# Patient Record
Sex: Female | Born: 1935 | Race: White | Hispanic: No | Smoking: Never smoker
Health system: Southern US, Community
[De-identification: ages and names within clinical notes are randomized; demographics above are authoritative.]

## PROBLEM LIST (undated history)

## (undated) ENCOUNTER — Emergency Department (HOSPITAL_COMMUNITY): Payer: Self-pay | Source: Home / Self Care

## (undated) DIAGNOSIS — M792 Neuralgia and neuritis, unspecified: Secondary | ICD-10-CM

## (undated) DIAGNOSIS — I447 Left bundle-branch block, unspecified: Secondary | ICD-10-CM

## (undated) DIAGNOSIS — M1990 Unspecified osteoarthritis, unspecified site: Secondary | ICD-10-CM

## (undated) DIAGNOSIS — R32 Unspecified urinary incontinence: Secondary | ICD-10-CM

## (undated) DIAGNOSIS — J189 Pneumonia, unspecified organism: Secondary | ICD-10-CM

## (undated) DIAGNOSIS — K219 Gastro-esophageal reflux disease without esophagitis: Secondary | ICD-10-CM

## (undated) DIAGNOSIS — G2581 Restless legs syndrome: Secondary | ICD-10-CM

## (undated) HISTORY — PX: INCONTINENCE SURGERY: SHX676

## (undated) HISTORY — PX: ABDOMINAL HYSTERECTOMY: SHX81

## (undated) HISTORY — PX: EYE SURGERY: SHX253

## (undated) HISTORY — PX: EXCISION MORTON'S NEUROMA: SHX5013

## (undated) HISTORY — PX: TONSILLECTOMY: SUR1361

---

## 1999-12-05 ENCOUNTER — Other Ambulatory Visit: Admission: RE | Admit: 1999-12-05 | Discharge: 1999-12-05 | Payer: Self-pay | Admitting: Family Medicine

## 1999-12-28 ENCOUNTER — Encounter: Payer: Self-pay | Admitting: Family Medicine

## 1999-12-28 ENCOUNTER — Encounter: Admission: RE | Admit: 1999-12-28 | Discharge: 1999-12-28 | Payer: Self-pay | Admitting: Family Medicine

## 2000-07-31 ENCOUNTER — Emergency Department (HOSPITAL_COMMUNITY): Admission: EM | Admit: 2000-07-31 | Discharge: 2000-07-31 | Payer: Self-pay | Admitting: Emergency Medicine

## 2000-07-31 ENCOUNTER — Encounter: Payer: Self-pay | Admitting: *Deleted

## 2000-09-12 ENCOUNTER — Encounter: Admission: RE | Admit: 2000-09-12 | Discharge: 2000-09-12 | Payer: Self-pay | Admitting: Internal Medicine

## 2002-08-13 ENCOUNTER — Encounter (INDEPENDENT_AMBULATORY_CARE_PROVIDER_SITE_OTHER): Payer: Self-pay | Admitting: *Deleted

## 2002-08-13 ENCOUNTER — Ambulatory Visit (HOSPITAL_COMMUNITY): Admission: RE | Admit: 2002-08-13 | Discharge: 2002-08-13 | Payer: Self-pay | Admitting: *Deleted

## 2003-03-09 ENCOUNTER — Other Ambulatory Visit: Admission: RE | Admit: 2003-03-09 | Discharge: 2003-03-09 | Payer: Self-pay | Admitting: Obstetrics and Gynecology

## 2003-11-17 ENCOUNTER — Encounter: Payer: Self-pay | Admitting: Internal Medicine

## 2004-04-19 ENCOUNTER — Encounter: Admission: RE | Admit: 2004-04-19 | Discharge: 2004-04-19 | Payer: Self-pay | Admitting: General Surgery

## 2004-05-13 ENCOUNTER — Inpatient Hospital Stay (HOSPITAL_COMMUNITY): Admission: RE | Admit: 2004-05-13 | Discharge: 2004-05-15 | Payer: Self-pay | Admitting: Surgery

## 2005-01-03 ENCOUNTER — Emergency Department (HOSPITAL_COMMUNITY): Admission: EM | Admit: 2005-01-03 | Discharge: 2005-01-03 | Payer: Self-pay | Admitting: Emergency Medicine

## 2005-01-22 ENCOUNTER — Encounter (INDEPENDENT_AMBULATORY_CARE_PROVIDER_SITE_OTHER): Payer: Self-pay | Admitting: *Deleted

## 2005-01-22 ENCOUNTER — Inpatient Hospital Stay (HOSPITAL_COMMUNITY): Admission: RE | Admit: 2005-01-22 | Discharge: 2005-01-25 | Payer: Self-pay | Admitting: Obstetrics and Gynecology

## 2005-03-30 ENCOUNTER — Ambulatory Visit: Payer: Self-pay | Admitting: Family Medicine

## 2005-04-09 ENCOUNTER — Ambulatory Visit: Payer: Self-pay | Admitting: Family Medicine

## 2005-05-14 ENCOUNTER — Ambulatory Visit (HOSPITAL_COMMUNITY): Admission: RE | Admit: 2005-05-14 | Discharge: 2005-05-14 | Payer: Self-pay | Admitting: *Deleted

## 2005-05-15 ENCOUNTER — Ambulatory Visit (HOSPITAL_COMMUNITY): Admission: RE | Admit: 2005-05-15 | Discharge: 2005-05-15 | Payer: Self-pay | Admitting: *Deleted

## 2005-07-25 ENCOUNTER — Ambulatory Visit: Payer: Self-pay | Admitting: Family Medicine

## 2005-08-01 ENCOUNTER — Ambulatory Visit: Payer: Self-pay | Admitting: Family Medicine

## 2005-08-08 ENCOUNTER — Ambulatory Visit: Payer: Self-pay | Admitting: Internal Medicine

## 2005-08-15 ENCOUNTER — Ambulatory Visit: Payer: Self-pay | Admitting: Family Medicine

## 2005-08-22 ENCOUNTER — Ambulatory Visit: Payer: Self-pay | Admitting: Family Medicine

## 2005-08-29 ENCOUNTER — Ambulatory Visit: Payer: Self-pay | Admitting: Family Medicine

## 2005-09-05 ENCOUNTER — Ambulatory Visit: Payer: Self-pay | Admitting: Family Medicine

## 2005-09-13 ENCOUNTER — Ambulatory Visit: Payer: Self-pay | Admitting: Family Medicine

## 2005-09-20 ENCOUNTER — Ambulatory Visit: Payer: Self-pay | Admitting: Internal Medicine

## 2005-09-26 ENCOUNTER — Ambulatory Visit: Payer: Self-pay | Admitting: Family Medicine

## 2005-10-03 ENCOUNTER — Ambulatory Visit: Payer: Self-pay | Admitting: Family Medicine

## 2005-10-10 ENCOUNTER — Ambulatory Visit: Payer: Self-pay | Admitting: Family Medicine

## 2005-11-08 ENCOUNTER — Ambulatory Visit: Payer: Self-pay | Admitting: Family Medicine

## 2005-12-05 ENCOUNTER — Ambulatory Visit: Payer: Self-pay | Admitting: Family Medicine

## 2006-01-02 ENCOUNTER — Ambulatory Visit: Payer: Self-pay | Admitting: Family Medicine

## 2006-02-04 ENCOUNTER — Ambulatory Visit: Payer: Self-pay | Admitting: Family Medicine

## 2006-03-06 ENCOUNTER — Ambulatory Visit: Payer: Self-pay | Admitting: Family Medicine

## 2006-04-03 ENCOUNTER — Ambulatory Visit: Payer: Self-pay | Admitting: Family Medicine

## 2006-04-11 ENCOUNTER — Encounter: Admission: RE | Admit: 2006-04-11 | Discharge: 2006-04-11 | Payer: Self-pay | Admitting: Obstetrics and Gynecology

## 2006-04-11 ENCOUNTER — Other Ambulatory Visit: Admission: RE | Admit: 2006-04-11 | Discharge: 2006-04-11 | Payer: Self-pay | Admitting: Obstetrics and Gynecology

## 2006-05-01 ENCOUNTER — Ambulatory Visit: Payer: Self-pay | Admitting: Family Medicine

## 2006-05-29 ENCOUNTER — Ambulatory Visit: Payer: Self-pay | Admitting: Family Medicine

## 2006-05-30 ENCOUNTER — Ambulatory Visit: Payer: Self-pay | Admitting: Family Medicine

## 2006-06-06 ENCOUNTER — Ambulatory Visit: Payer: Self-pay | Admitting: Family Medicine

## 2006-06-27 ENCOUNTER — Ambulatory Visit: Payer: Self-pay | Admitting: Family Medicine

## 2006-07-24 ENCOUNTER — Ambulatory Visit: Payer: Self-pay | Admitting: Family Medicine

## 2006-08-22 ENCOUNTER — Ambulatory Visit: Payer: Self-pay | Admitting: Family Medicine

## 2006-09-18 ENCOUNTER — Ambulatory Visit: Payer: Self-pay | Admitting: Family Medicine

## 2006-10-16 ENCOUNTER — Ambulatory Visit: Payer: Self-pay | Admitting: Family Medicine

## 2006-10-24 ENCOUNTER — Ambulatory Visit: Payer: Self-pay | Admitting: Family Medicine

## 2006-10-24 ENCOUNTER — Encounter: Admission: RE | Admit: 2006-10-24 | Discharge: 2006-10-24 | Payer: Self-pay | Admitting: Family Medicine

## 2006-10-24 LAB — CONVERTED CEMR LAB
Albumin: 3.8 g/dL (ref 3.5–5.2)
Chloride: 99 meq/L (ref 96–112)
Chol/HDL Ratio, serum: 3.7
Cholesterol: 244 mg/dL (ref 0–200)
Eosinophil percent: 1 % (ref 0.0–5.0)
Folate: >20 ng/mL
Free T4: 0.9 ng/dL (ref 0.9–1.8)
HCT: 39.9 % (ref 36.0–46.0)
HDL: 65.9 mg/dL (ref >39.0)
LDL DIRECT: 159.4 mg/dL
MCHC: 33.9 g/dL (ref 30.0–36.0)
Monocytes Absolute: 0.6 10*3/uL (ref 0.2–0.7)
Monocytes Relative: 9.5 % (ref 3.0–11.0)
Neutro Abs: 3.2 10*3/uL (ref 1.4–7.7)
Platelets: 223 10*3/uL (ref 150–400)
Potassium: 3.7 meq/L (ref 3.5–5.1)
RBC: 4.2 M/uL (ref 3.87–5.11)
RDW: 12.5 % (ref 11.5–14.6)
Sodium: 139 meq/L (ref 135–145)
T3, Free: 3.2 pg/mL (ref 2.3–4.2)
TSH: 1.48 microintl units/mL (ref 0.35–5.50)
Total Bilirubin: 0.5 mg/dL (ref 0.3–1.2)
Triglyceride fasting, serum: 51 mg/dL (ref 0–149)
VLDL: 10 mg/dL (ref 0–40)
Vitamin B-12: 691 pg/mL (ref 211–911)
WBC: 5.8 10*3/uL (ref 4.5–10.5)

## 2006-11-27 ENCOUNTER — Ambulatory Visit: Payer: Self-pay | Admitting: Family Medicine

## 2007-03-19 ENCOUNTER — Ambulatory Visit: Payer: Self-pay | Admitting: Family Medicine

## 2007-03-24 ENCOUNTER — Encounter: Admission: RE | Admit: 2007-03-24 | Discharge: 2007-03-24 | Payer: Self-pay | Admitting: Specialist

## 2007-04-17 ENCOUNTER — Ambulatory Visit: Payer: Self-pay | Admitting: Family Medicine

## 2007-04-17 ENCOUNTER — Encounter: Payer: Self-pay | Admitting: Internal Medicine

## 2007-04-17 LAB — CONVERTED CEMR LAB
Alkaline Phosphatase: 72 units/L (ref 39–117)
Basophils Absolute: 0.2 10*3/uL — ABNORMAL HIGH (ref 0.0–0.1)
Bilirubin, Direct: 0.1 mg/dL (ref 0.0–0.3)
CO2: 32 meq/L (ref 19–32)
Chloride: 108 meq/L (ref 96–112)
Creatinine, Ser: 0.6 mg/dL (ref 0.4–1.2)
Eosinophils Relative: 0.9 % (ref 0.0–5.0)
Ferritin: 65.9 ng/mL (ref 10.0–291.0)
Folate: 12.4 ng/mL
GFR calc Af Amer: 122 mL/min
GFR calc non Af Amer: 101 mL/min
HCT: 38.1 % (ref 36.0–46.0)
Hemoglobin: 12.8 g/dL (ref 12.0–15.0)
Homocysteine: 9.7 micromoles/L (ref 5.00–13.90)
Iron: 64 ug/dL (ref 42–145)
MCHC: 33.6 g/dL (ref 30.0–36.0)
MCV: 94.7 fL (ref 78.0–100.0)
Monocytes Absolute: 0.4 10*3/uL (ref 0.2–0.7)
Monocytes Relative: 6.8 % (ref 3.0–11.0)
Neutro Abs: 3 10*3/uL (ref 1.4–7.7)
RBC: 4.02 M/uL (ref 3.87–5.11)
Saturation Ratios: 20.4 % (ref 20.0–50.0)
Total Bilirubin: 0.6 mg/dL (ref 0.3–1.2)

## 2007-04-21 ENCOUNTER — Encounter: Payer: Self-pay | Admitting: Family Medicine

## 2007-06-11 ENCOUNTER — Encounter: Payer: Self-pay | Admitting: Family Medicine

## 2007-06-17 DIAGNOSIS — M810 Age-related osteoporosis without current pathological fracture: Secondary | ICD-10-CM

## 2007-06-17 DIAGNOSIS — G609 Hereditary and idiopathic neuropathy, unspecified: Secondary | ICD-10-CM

## 2007-06-17 DIAGNOSIS — G2581 Restless legs syndrome: Secondary | ICD-10-CM | POA: Insufficient documentation

## 2007-06-17 DIAGNOSIS — M1990 Unspecified osteoarthritis, unspecified site: Secondary | ICD-10-CM

## 2007-12-11 ENCOUNTER — Encounter: Payer: Self-pay | Admitting: Family Medicine

## 2007-12-29 ENCOUNTER — Ambulatory Visit: Payer: Self-pay | Admitting: Internal Medicine

## 2007-12-29 DIAGNOSIS — I2789 Other specified pulmonary heart diseases: Secondary | ICD-10-CM | POA: Insufficient documentation

## 2007-12-29 DIAGNOSIS — M4120 Other idiopathic scoliosis, site unspecified: Secondary | ICD-10-CM | POA: Insufficient documentation

## 2008-01-06 ENCOUNTER — Ambulatory Visit: Payer: Self-pay

## 2008-01-06 ENCOUNTER — Telehealth: Payer: Self-pay | Admitting: Internal Medicine

## 2008-01-14 ENCOUNTER — Ambulatory Visit: Payer: Self-pay | Admitting: Pulmonary Disease

## 2008-01-21 ENCOUNTER — Encounter: Payer: Self-pay | Admitting: Pulmonary Disease

## 2008-01-29 ENCOUNTER — Encounter: Payer: Self-pay | Admitting: Pulmonary Disease

## 2008-03-12 ENCOUNTER — Telehealth (INDEPENDENT_AMBULATORY_CARE_PROVIDER_SITE_OTHER): Payer: Self-pay | Admitting: *Deleted

## 2008-04-06 ENCOUNTER — Telehealth: Payer: Self-pay | Admitting: Pulmonary Disease

## 2008-04-21 ENCOUNTER — Encounter: Payer: Self-pay | Admitting: Pulmonary Disease

## 2008-05-05 ENCOUNTER — Telehealth (INDEPENDENT_AMBULATORY_CARE_PROVIDER_SITE_OTHER): Payer: Self-pay | Admitting: *Deleted

## 2008-05-06 ENCOUNTER — Telehealth: Payer: Self-pay | Admitting: Internal Medicine

## 2008-05-18 ENCOUNTER — Telehealth (INDEPENDENT_AMBULATORY_CARE_PROVIDER_SITE_OTHER): Payer: Self-pay | Admitting: *Deleted

## 2008-05-25 ENCOUNTER — Telehealth: Payer: Self-pay | Admitting: Internal Medicine

## 2008-06-07 ENCOUNTER — Ambulatory Visit: Payer: Self-pay | Admitting: Surgery

## 2008-08-16 ENCOUNTER — Telehealth (INDEPENDENT_AMBULATORY_CARE_PROVIDER_SITE_OTHER): Payer: Self-pay | Admitting: *Deleted

## 2008-08-16 ENCOUNTER — Ambulatory Visit: Payer: Self-pay | Admitting: Family Medicine

## 2008-10-12 ENCOUNTER — Ambulatory Visit: Payer: Self-pay | Admitting: Family Medicine

## 2008-10-12 DIAGNOSIS — R609 Edema, unspecified: Secondary | ICD-10-CM | POA: Insufficient documentation

## 2008-10-13 LAB — CONVERTED CEMR LAB
BUN: 22 mg/dL (ref 6–23)
CO2: 30 meq/L (ref 19–32)
Calcium: 9.2 mg/dL (ref 8.4–10.5)
GFR calc non Af Amer: 101 mL/min

## 2008-10-14 ENCOUNTER — Encounter (INDEPENDENT_AMBULATORY_CARE_PROVIDER_SITE_OTHER): Payer: Self-pay | Admitting: *Deleted

## 2008-10-15 ENCOUNTER — Telehealth (INDEPENDENT_AMBULATORY_CARE_PROVIDER_SITE_OTHER): Payer: Self-pay | Admitting: *Deleted

## 2008-10-29 ENCOUNTER — Ambulatory Visit: Payer: Self-pay | Admitting: Family Medicine

## 2008-11-05 ENCOUNTER — Encounter (INDEPENDENT_AMBULATORY_CARE_PROVIDER_SITE_OTHER): Payer: Self-pay | Admitting: *Deleted

## 2008-11-05 LAB — CONVERTED CEMR LAB
ALT: 16 units/L (ref 0–35)
Albumin: 4.3 g/dL (ref 3.5–5.2)
BUN: 31 mg/dL — ABNORMAL HIGH (ref 6–23)
Basophils Absolute: 0 10*3/uL (ref 0.0–0.1)
CO2: 26 meq/L (ref 19–32)
Calcium: 9.3 mg/dL (ref 8.4–10.5)
Cholesterol: 190 mg/dL (ref 0–200)
Creatinine, Ser: 0.7 mg/dL (ref 0.40–1.20)
Eosinophils Absolute: 0 10*3/uL (ref 0.0–0.7)
Folate: 16.8 ng/mL
Glucose, Bld: 85 mg/dL (ref 70–99)
HCT: 40.1 % (ref 36.0–46.0)
Hemoglobin: 13.1 g/dL (ref 12.0–15.0)
Indirect Bilirubin: 0.3 mg/dL (ref 0.0–0.9)
LDL Cholesterol: 115 mg/dL — ABNORMAL HIGH (ref 0–99)
Lymphs Abs: 1.2 10*3/uL (ref 0.7–4.0)
MCHC: 32.7 g/dL (ref 30.0–36.0)
Monocytes Absolute: 0.6 10*3/uL (ref 0.1–1.0)
Neutro Abs: 4.8 10*3/uL (ref 1.7–7.7)
RBC: 4.19 M/uL (ref 3.87–5.11)
RDW: 13.5 % (ref 11.5–15.5)
Sodium: 142 meq/L (ref 135–145)
Total Bilirubin: 0.4 mg/dL (ref 0.3–1.2)
Vit D, 1,25-Dihydroxy: 30 (ref 30–89)
Vitamin B-12: 446 pg/mL (ref 211–911)
WBC: 6.7 10*3/uL (ref 4.0–10.5)

## 2008-11-12 ENCOUNTER — Encounter: Payer: Self-pay | Admitting: Family Medicine

## 2009-01-21 ENCOUNTER — Ambulatory Visit: Payer: Self-pay | Admitting: Family Medicine

## 2009-01-21 DIAGNOSIS — L02419 Cutaneous abscess of limb, unspecified: Secondary | ICD-10-CM | POA: Insufficient documentation

## 2009-01-21 DIAGNOSIS — L03119 Cellulitis of unspecified part of limb: Secondary | ICD-10-CM

## 2009-01-21 DIAGNOSIS — M79609 Pain in unspecified limb: Secondary | ICD-10-CM | POA: Insufficient documentation

## 2009-01-24 ENCOUNTER — Ambulatory Visit: Admission: RE | Admit: 2009-01-24 | Discharge: 2009-01-24 | Payer: Self-pay | Admitting: Family Medicine

## 2009-01-24 ENCOUNTER — Encounter: Payer: Self-pay | Admitting: Family Medicine

## 2009-01-24 ENCOUNTER — Ambulatory Visit: Payer: Self-pay | Admitting: Vascular Surgery

## 2009-01-27 ENCOUNTER — Ambulatory Visit: Payer: Self-pay | Admitting: Family Medicine

## 2009-02-10 ENCOUNTER — Ambulatory Visit: Payer: Self-pay | Admitting: Family Medicine

## 2009-02-10 DIAGNOSIS — S81009A Unspecified open wound, unspecified knee, initial encounter: Secondary | ICD-10-CM | POA: Insufficient documentation

## 2009-02-10 DIAGNOSIS — S91009A Unspecified open wound, unspecified ankle, initial encounter: Secondary | ICD-10-CM

## 2009-02-10 DIAGNOSIS — S81809A Unspecified open wound, unspecified lower leg, initial encounter: Secondary | ICD-10-CM

## 2009-02-14 ENCOUNTER — Encounter: Payer: Self-pay | Admitting: Family Medicine

## 2009-02-23 ENCOUNTER — Emergency Department (HOSPITAL_COMMUNITY): Admission: EM | Admit: 2009-02-23 | Discharge: 2009-02-23 | Payer: Self-pay | Admitting: Emergency Medicine

## 2009-02-23 ENCOUNTER — Encounter (HOSPITAL_BASED_OUTPATIENT_CLINIC_OR_DEPARTMENT_OTHER): Admission: RE | Admit: 2009-02-23 | Discharge: 2009-02-25 | Payer: Self-pay | Admitting: General Surgery

## 2009-02-24 ENCOUNTER — Encounter: Payer: Self-pay | Admitting: Family Medicine

## 2009-02-28 ENCOUNTER — Encounter (HOSPITAL_BASED_OUTPATIENT_CLINIC_OR_DEPARTMENT_OTHER): Admission: RE | Admit: 2009-02-28 | Discharge: 2009-05-29 | Payer: Self-pay | Admitting: General Surgery

## 2009-03-28 ENCOUNTER — Encounter: Payer: Self-pay | Admitting: Family Medicine

## 2009-05-02 ENCOUNTER — Encounter: Payer: Self-pay | Admitting: Family Medicine

## 2009-06-06 ENCOUNTER — Encounter (HOSPITAL_BASED_OUTPATIENT_CLINIC_OR_DEPARTMENT_OTHER): Admission: RE | Admit: 2009-06-06 | Discharge: 2009-09-04 | Payer: Self-pay | Admitting: General Surgery

## 2009-09-02 ENCOUNTER — Encounter (HOSPITAL_BASED_OUTPATIENT_CLINIC_OR_DEPARTMENT_OTHER): Admission: RE | Admit: 2009-09-02 | Discharge: 2009-11-07 | Payer: Self-pay | Admitting: General Surgery

## 2009-09-22 ENCOUNTER — Encounter: Payer: Self-pay | Admitting: Family Medicine

## 2009-10-27 ENCOUNTER — Telehealth (INDEPENDENT_AMBULATORY_CARE_PROVIDER_SITE_OTHER): Payer: Self-pay | Admitting: *Deleted

## 2009-11-23 ENCOUNTER — Encounter (HOSPITAL_BASED_OUTPATIENT_CLINIC_OR_DEPARTMENT_OTHER): Admission: RE | Admit: 2009-11-23 | Discharge: 2009-12-26 | Payer: Self-pay | Admitting: General Surgery

## 2009-12-08 ENCOUNTER — Telehealth: Payer: Self-pay | Admitting: Family Medicine

## 2009-12-27 ENCOUNTER — Encounter (HOSPITAL_BASED_OUTPATIENT_CLINIC_OR_DEPARTMENT_OTHER): Admission: RE | Admit: 2009-12-27 | Discharge: 2010-02-07 | Payer: Self-pay | Admitting: General Surgery

## 2010-01-02 ENCOUNTER — Emergency Department (HOSPITAL_COMMUNITY): Admission: EM | Admit: 2010-01-02 | Discharge: 2010-01-02 | Payer: Self-pay | Admitting: Emergency Medicine

## 2010-01-12 ENCOUNTER — Telehealth: Payer: Self-pay | Admitting: Family Medicine

## 2010-01-16 ENCOUNTER — Encounter: Payer: Self-pay | Admitting: Family Medicine

## 2010-02-03 ENCOUNTER — Encounter: Payer: Self-pay | Admitting: Family Medicine

## 2010-03-29 ENCOUNTER — Telehealth (INDEPENDENT_AMBULATORY_CARE_PROVIDER_SITE_OTHER): Payer: Self-pay | Admitting: *Deleted

## 2010-04-07 ENCOUNTER — Telehealth: Payer: Self-pay | Admitting: Family Medicine

## 2010-04-25 ENCOUNTER — Encounter: Payer: Self-pay | Admitting: Family Medicine

## 2010-04-27 ENCOUNTER — Encounter: Admission: RE | Admit: 2010-04-27 | Discharge: 2010-04-27 | Payer: Self-pay | Admitting: Neurology

## 2010-04-28 ENCOUNTER — Telehealth (INDEPENDENT_AMBULATORY_CARE_PROVIDER_SITE_OTHER): Payer: Self-pay | Admitting: *Deleted

## 2010-05-02 ENCOUNTER — Ambulatory Visit: Payer: Self-pay | Admitting: Family Medicine

## 2010-05-02 DIAGNOSIS — N39498 Other specified urinary incontinence: Secondary | ICD-10-CM

## 2010-05-05 ENCOUNTER — Encounter: Payer: Self-pay | Admitting: Family Medicine

## 2010-05-08 ENCOUNTER — Encounter: Payer: Self-pay | Admitting: Family Medicine

## 2010-05-08 ENCOUNTER — Telehealth: Payer: Self-pay | Admitting: Family Medicine

## 2010-05-11 ENCOUNTER — Telehealth: Payer: Self-pay | Admitting: Family Medicine

## 2010-05-16 ENCOUNTER — Encounter: Payer: Self-pay | Admitting: Family Medicine

## 2010-05-23 ENCOUNTER — Telehealth: Payer: Self-pay | Admitting: Family Medicine

## 2010-06-01 ENCOUNTER — Encounter: Payer: Self-pay | Admitting: Family Medicine

## 2010-06-05 ENCOUNTER — Encounter: Payer: Self-pay | Admitting: Family Medicine

## 2010-06-08 ENCOUNTER — Encounter: Payer: Self-pay | Admitting: Family Medicine

## 2010-06-09 ENCOUNTER — Telehealth: Payer: Self-pay | Admitting: Family Medicine

## 2010-06-21 ENCOUNTER — Telehealth: Payer: Self-pay | Admitting: Family Medicine

## 2010-06-28 ENCOUNTER — Encounter (HOSPITAL_BASED_OUTPATIENT_CLINIC_OR_DEPARTMENT_OTHER): Admission: RE | Admit: 2010-06-28 | Discharge: 2010-07-26 | Payer: Self-pay | Admitting: Internal Medicine

## 2010-07-04 ENCOUNTER — Ambulatory Visit: Payer: Self-pay | Admitting: Family Medicine

## 2010-07-15 ENCOUNTER — Encounter: Payer: Self-pay | Admitting: Family Medicine

## 2010-07-27 ENCOUNTER — Telehealth (INDEPENDENT_AMBULATORY_CARE_PROVIDER_SITE_OTHER): Payer: Self-pay | Admitting: *Deleted

## 2010-09-05 ENCOUNTER — Telehealth: Payer: Self-pay | Admitting: Family Medicine

## 2010-09-26 ENCOUNTER — Telehealth (INDEPENDENT_AMBULATORY_CARE_PROVIDER_SITE_OTHER): Payer: Self-pay | Admitting: *Deleted

## 2011-01-08 IMAGING — CR DG CHEST 2V
2 series · 2 of 2 positions shown · non-contrast
Comparison: 01/14/2008

CLINICAL DATA: Cough and shortness of breath

CHEST - 2 VIEW

[w chest pa]
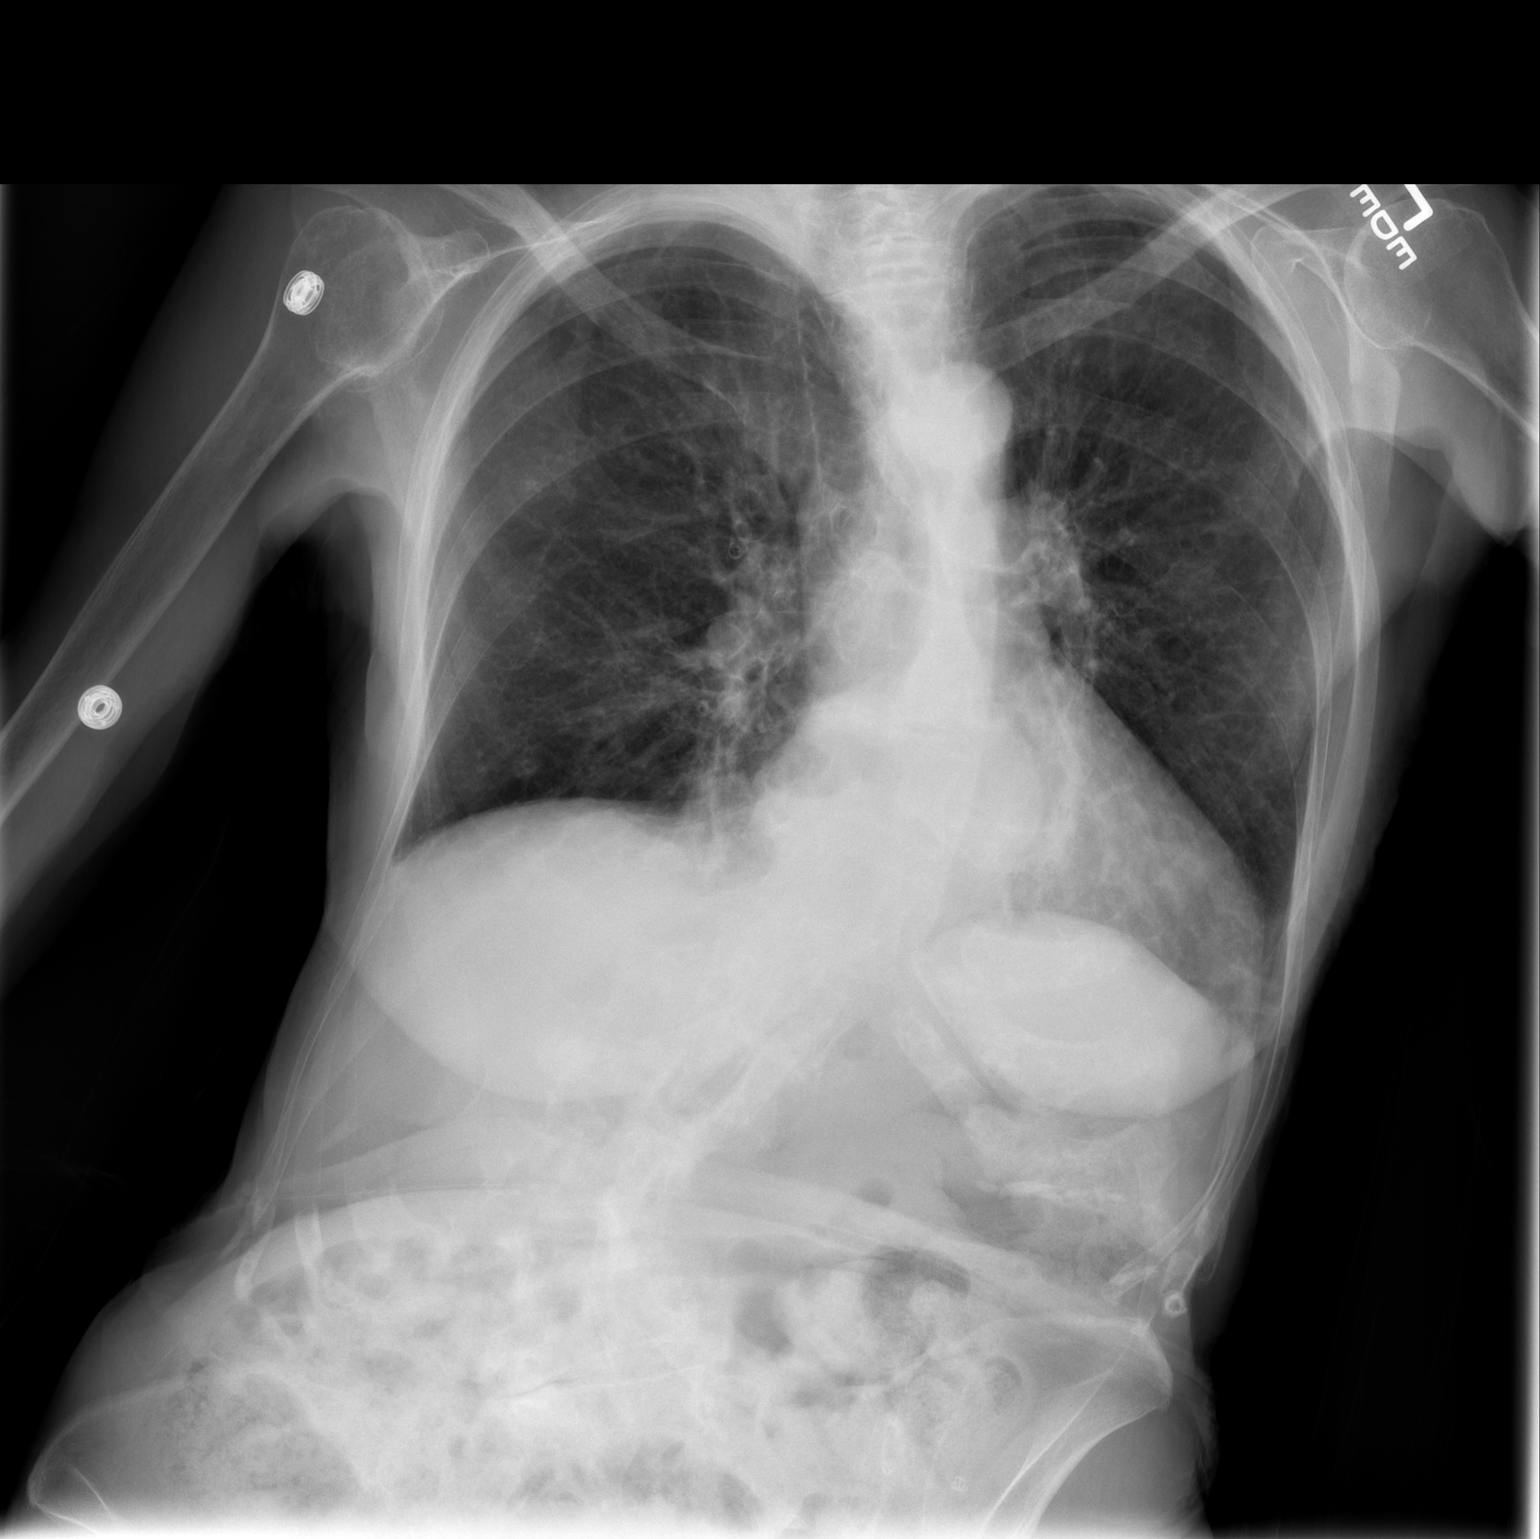

[w chest lat]
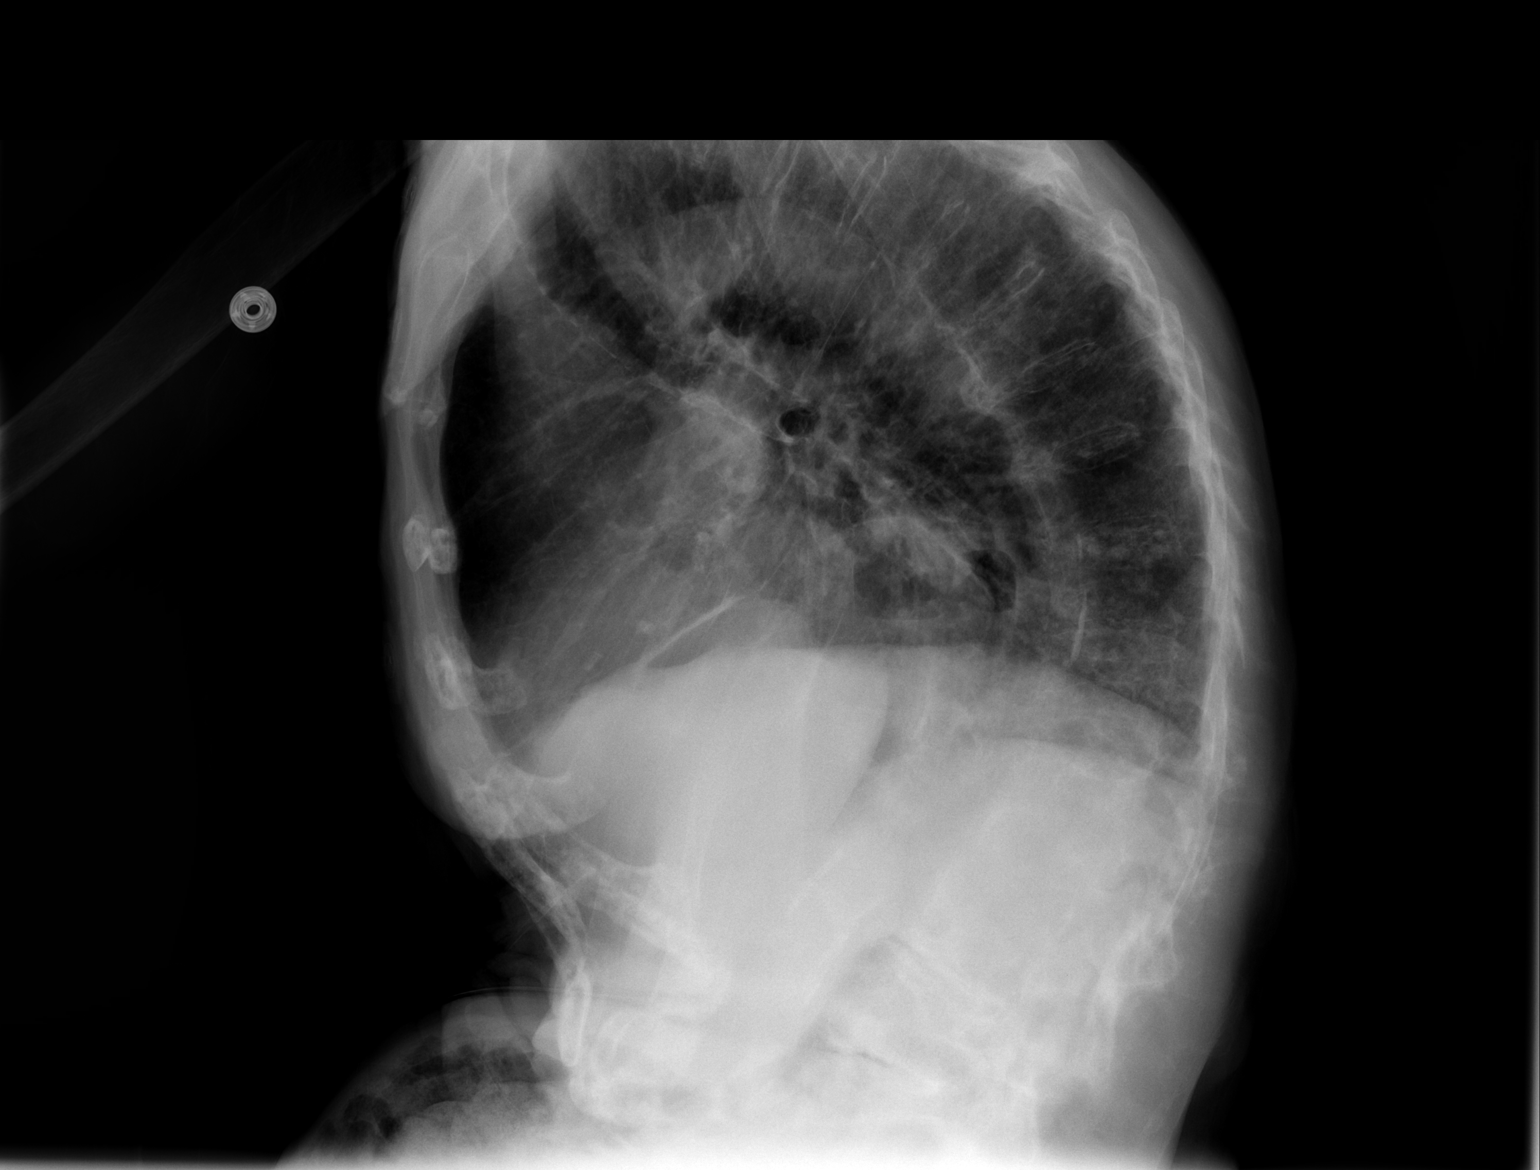

[2 of 2 positions shown; findings below may reference images not displayed]

FINDINGS: The hiatal hernia is stable.  The heart is mildly
enlarged.  Pulmonary vascularity is within normal limits.  No
consolidation.  There is a 1 cm nodular density in the right upper
lung zone.  No pneumothorax.
IMPRESSION: New right upper lobe nodular density.  CT is recommended.

## 2011-01-21 ENCOUNTER — Encounter: Payer: Self-pay | Admitting: General Surgery

## 2011-01-28 LAB — CONVERTED CEMR LAB
AST: 20 units/L (ref 0–37)
BUN: 15 mg/dL (ref 6–23)
Basophils Absolute: 0.1 10*3/uL (ref 0.0–0.1)
CO2: 30 meq/L (ref 19–32)
Calcium: 8.8 mg/dL (ref 8.4–10.5)
Chloride: 106 meq/L (ref 96–112)
Eosinophils Absolute: 0 10*3/uL (ref 0.0–0.6)
Eosinophils Relative: 0.3 % (ref 0.0–5.0)
Lymphocytes Relative: 17.5 % (ref 12.0–46.0)
Platelets: 243 10*3/uL (ref 150–400)
RDW: 12.8 % (ref 11.5–14.6)
Sodium: 143 meq/L (ref 135–145)
WBC: 5.9 10*3/uL (ref 4.5–10.5)

## 2011-02-01 NOTE — Progress Notes (Addendum)
Summary: fyi  ref b12  Phone Note Call from Patient Call back at [phone]   Caller: Son Summary of Call: Son [name] called wanted to know who is responsible for telling  pt she no longer needs b12 shots.  Dr Laury Axon I have looked in pt chart no documentation, son  seems a little confused, does not remember when mom started or stopped. I recommend pt for office visit, son says he will check with Dr Sandria Manly office first thinks perhaps they did, before sheduling ov Initial call taken by: [name],  April 07, 2010 8:33 AM

## 2011-02-01 NOTE — Progress Notes (Addendum)
Summary: refill  Phone Note Refill Request   Refills Requested: Medication #1:  MIRAPEX 0.25 MG TABS 1 by mouth once daily Last ov- 04/2010. We have never filled this med for her before, would like 90 day supply with 3 refills. [facility]. Okay to give?     Prescriptions: MIRAPEX 0.25 MG TABS (PRAMIPEXOLE DIHYDROCHLORIDE) 1 by mouth once daily  #90 x 3   Entered and Authorized by:   Loreen Freud DO   Signed by:   Loreen Freud DO on 05/23/2010   Method used:   Faxed to ...       [facility] (retail)             , [place]         Ph: [phone]       Fax: [phone]   RxID:   [id number]

## 2011-02-01 NOTE — Miscellaneous (Addendum)
Summary: OT Orders/[facility]  OT Orders/[facility]   Imported By: Lanelle Bal 06/12/2010 11:20:43  _____________________________________________________________________  External Attachment:    Type:   Image     Comment:   External Document

## 2011-02-01 NOTE — Progress Notes (Addendum)
Summary: brain scan  Phone Note Call from Patient Call back at Home Phone [phone] Call back at [phone]   Caller: Daughter(Ladonna) Summary of Call: Pt [facility] at [facility] and the therapist has notice as well as her daughter that pt is having some cognitive issue. Pt daughter would like for dr Laury Axon to order a brain scan for the pt. pt daughter states that she was told that this test must be order by PCP. Pt does have neurologist on file would pt contact them or would you order scan but prefer to evaluate pt first. pls advise...........Marland KitchenFelecia Deloach CMA  March 29, 2010 2:56 PM   pt daughter aware out of office until AM...........Marland KitchenFelecia Deloach CMA  March 29, 2010 3:08 PM   Follow-up for Phone Call        I would need to see pt----- however if pt has a neurologist--she should call them first Follow-up by: Loreen Freud DO,  March 30, 2010 8:47 AM  Additional Follow-up for Phone Call Additional follow up Details #1::        left message to call office.............Marland KitchenFelecia Deloach CMA  March 30, 2010 9:23 AM   pt daughter will contact neuro..............Marland KitchenFelecia Deloach CMA  March 30, 2010 11:55 AM

## 2011-02-01 NOTE — Progress Notes (Addendum)
Summary: omeprazole refill   Phone Note Refill Request Message from:  Patient  Refills Requested: Medication #1:  OMEPRAZOLE 40 MG CPDR 1 by mouth once daily Initial call taken by: Doristine Devoid CMA,  September 26, 2010 12:02 PM    Prescriptions: OMEPRAZOLE 40 MG CPDR (OMEPRAZOLE) 1 by mouth once daily  #90 x 1   Entered by:   Doristine Devoid CMA   Authorized by:   Loreen Freud DO   Signed by:   Doristine Devoid CMA on 09/26/2010   Method used:   Electronically to        [facility] (mail-order)       [address]       [place], [place]  [address]       Ph: [phone]       Fax: [phone]   RxID:   [id number]

## 2011-02-01 NOTE — Progress Notes (Addendum)
Summary: home health referral  Phone Note Call from Patient Call back at Home Phone [phone] Call back at [phone]   Caller: Virgel Paling (daughter) Summary of Call: Pt is to have surgery and is going to needs some help with personal care afterward. per [place] unsure who or what she needs to do to get this started. dr Laury Axon pls advise on home health aide.................Marland KitchenFelecia Deloach CMA  January 12, 2010 4:59 PM   Follow-up for Phone Call        Usually whoever does surgery sets pt up with home health before D/C Follow-up by: Loreen Freud DO,  January 12, 2010 5:02 PM  Additional Follow-up for Phone Call Additional follow up Details #1::        advise pt daughter of dr Laury Axon suggestion.Felecia Deloach CMA  January 13, 2010 8:25 AM

## 2011-02-01 NOTE — Miscellaneous (Addendum)
Summary: Episode Summary/[facility]  Episode Summary/[facility]   Imported By: Lanelle Bal 07/27/2010 13:57:53  _____________________________________________________________________  External Attachment:    Type:   Image     Comment:   External Document

## 2011-02-01 NOTE — Progress Notes (Addendum)
Summary: refill  Phone Note Refill Request Message from:  Patient on July 27, 2010 1:38 PM  Refills Requested: Medication #1:  TRAMADOL HCL 50 MG TABS 1 by mouth every 4-6 hrs as needed. patient will be out of med [facility]  Initial call taken by: Okey Regal Spring,  July 27, 2010 1:39 PM  Follow-up for Phone Call        last filled 05-08-10 #90, last ov 05-02-10...Marland KitchenMarland KitchenFelecia Deloach CMA  July 27, 2010 2:29 PM   Additional Follow-up for Phone Call Additional follow up Details #1::        ok to refill  Additional Follow-up by: Loreen Freud DO,  July 27, 2010 2:35 PM    Prescriptions: TRAMADOL HCL 50 MG TABS (TRAMADOL HCL) 1 by mouth every 4-6 hrs as needed.  #90 x 0   Entered by:   Jeremy Johann CMA   Authorized by:   Loreen Freud DO   Signed by:   Jeremy Johann CMA on 07/27/2010   Method used:   Reprint   RxID:   [id number] TRAMADOL HCL 50 MG TABS (TRAMADOL HCL) 1 by mouth every 4-6 hrs as needed.  #90 x 0   Entered by:   Lucious Groves CMA   Authorized by:   Loreen Freud DO   Signed by:   Lucious Groves CMA on 07/27/2010   Method used:   Print then Give to Patient   RxID:   [id number]

## 2011-02-01 NOTE — Miscellaneous (Addendum)
Summary: Face to Face Encounter Form/[facility]  Face to Face Encounter Form/[facility]   Imported By: Lanelle Bal 05/12/2010 10:48:40  _____________________________________________________________________  External Attachment:    Type:   Image     Comment:   External Document

## 2011-02-01 NOTE — Assessment & Plan Note (Addendum)
Summary: FOLLOW UP FROM DISCHARGED [facility]/NTA   Vital Signs:  Patient profile:   77 year old female Height:      56.5 inches Weight:      97 pounds BMI:     21.44 Pulse rate:   68 / minute Pulse rhythm:   regular BP sitting:   120 / 70  (left arm) Cuff size:   regular  Vitals Entered By: Army Fossa CMA (May 02, 2010 1:53 PM) CC: Follow up was discharged from [facility]. -unsure of meds trying to get them from [facility]   History of Present Illness: Pt here with daughter to f/u from [facility].  Pt doing well and is happy to be home.  Pt still c/o some urinary problems and she is not sure the vesicare is helping.   Preventive Screening-Counseling & Management  Alcohol-Tobacco     Smoking Status: never  Current Medications (verified): 1)  Omeprazole 40 Mg Cpdr (Omeprazole) .Marland Kitchen.. 1 By Mouth Once Daily 2)  Mirapex 0.25 Mg Tabs (Pramipexole Dihydrochloride) .Marland Kitchen.. 1 By Mouth Once Daily 3)  Vesicare 5 Mg  Tabs (Solifenacin Succinate) .... Take 1 Tab Two Times A Day 4)  Balmex 11.3 % Crea (Zinc Oxide) 5)  Neurontin 100 Mg Caps (Gabapentin) .Marland Kitchen.. 1 By Mouth Three Times A Day 6)  Med Pass 8oz .... Three Times A Day 7)  Vitamin D 1000 Unit Tabs (Cholecalciferol) .Marland Kitchen.. 1 By Mouth Once Daily 8)  Mirapex 0.25 Mg Tabs (Pramipexole Dihydrochloride) .... Take 1 By Mouth At Bedtime. 9)  Prilosec 40 Mg Cpdr (Omeprazole) .Marland Kitchen.. 1 Cap By Mouth At Bedtime 10)  Metanx 3-35-2 Mg Tabs (L-Methylfolate-B6-B12) .... Take 1 Tab By Mouth Once Daily. 11)  Mvi 12)  Metamucil 30.9 % Powd (Psyllium) 13)  Vicodin 5-500 Mg Tabs (Hydrocodone-Acetaminophen) .... By Mouth As Needed Every 6 Hrs. 14)  Phenergan 25 Mg/ml Soln (Promethazine Hcl) .... Q12 Hr As Needed For Nausea and Vomitting. 15)  Colace 100 Mg Caps (Docusate Sodium) .Marland Kitchen.. 1 By Mouth Two Times A Day As Needed Constipation.  Allergies (verified): No Known Drug Allergies  Past History:  Past Medical History: Last updated:  12/29/2007 Osteoarthritis Osteoporosis mild pulm HTN + TR- nod  Incontinence (Pessory) scoliosis  Social History: Last updated: 01/14/2008 her 2 sons live w/ her widow Patient never smoked.   Risk Factors: Smoking Status: never (05/02/2010)  Past Surgical History: Cataract extraction Hysterectomy and bladder sling--rectocele repair (2006) Tonsillectomy proctosigmoidectomy with levatorplasty--- [facility]  02/03/2010-- Dr Al Pimple surgical  Review of Systems      See HPI  Physical Exam  General:  alert, well-developed, well-nourished, and well-hydrated.   Mouth:  Oral mucosa and oropharynx without lesions or exudates.  Teeth in good repair. Neck:  No deformities, masses, or tenderness noted. Lungs:  Normal respiratory effort, chest expands symmetrically. Lungs are clear to auscultation, no crackles or wheezes. Heart:  normal rate and no murmur.   Abdomen:  Bowel sounds positive,abdomen soft and non-tender without masses, organomegaly or hernias noted. Extremities:  1+ left pedal edema and 1+ right pedal edema.   Skin:  R food-- wound at top of foot healing well  L foot still wrapped--daughter states it is healing well to Psych:  normally interactive, good eye contact, not anxious appearing, and not depressed appearing.     Impression & Recommendations:  Problem # 1:  WOUND, LEG (ICD-891.0)  Orders: Home Health Referral (Home Health)  Problem # 2:  PERIPHERAL NEUROPATHY (  ICD-356.9) per neuro pt is on gabepentin Orders: Home Health Referral (Home Health)  Problem # 3:  OSTEOPOROSIS (ICD-733.00)  Her updated medication list for this problem includes:    Vitamin D 1000 Unit Tabs (Cholecalciferol) .Marland Kitchen... 1 by mouth once daily  Orders: Home Health Referral [facility])  Vit D:30 (10/29/2008)  Problem # 4:  OSTEOARTHRITIS (ICD-715.90)  The following medications were removed from the medication list:    Tramadol Hcl 50 Mg Tabs (Tramadol hcl) .Marland Kitchen... 1 tab by  mouth every 4 to 6 hours as needed    Darvocet-n 100 100-650 Mg Tabs (Propoxyphene n-apap) Her updated medication list for this problem includes:    Vicodin 5-500 Mg Tabs (Hydrocodone-acetaminophen) ..... By mouth as needed every 6 hrs.  Orders: Home Health Referral [facility])  Discussed use of medications, application of heat or cold, and exercises.   Problem # 5:  STRESS INCONTINENCE (ICD-788.39) con't vesicare may need urology  Problem # 6:  PULMONARY HYPERTENSION, MILD (ICD-416.8)  Orders: Home Health Referral (Home Health)  Complete Medication List: 1)  Omeprazole 40 Mg Cpdr (Omeprazole) .Marland Kitchen.. 1 by mouth once daily 2)  Mirapex 0.25 Mg Tabs (Pramipexole dihydrochloride) .Marland Kitchen.. 1 by mouth once daily 3)  Vesicare 5 Mg Tabs (Solifenacin succinate) .... Take 1 tab two times a day 4)  Balmex 11.3 % Crea (Zinc oxide) 5)  Neurontin 100 Mg Caps (Gabapentin) .Marland Kitchen.. 1 by mouth three times a day 6)  Med Pass 8oz  .... Three times a day 7)  Vitamin D 1000 Unit Tabs (Cholecalciferol) .Marland Kitchen.. 1 by mouth once daily 8)  Mirapex 0.25 Mg Tabs (Pramipexole dihydrochloride) .... Take 1 by mouth at bedtime. 9)  Prilosec 40 Mg Cpdr (Omeprazole) .Marland Kitchen.. 1 cap by mouth at bedtime 10)  Metanx 3-35-2 Mg Tabs (L-methylfolate-b6-b12) .... Take 1 tab by mouth once daily. 11)  Mvi  12)  Metamucil 30.9 % Powd (Psyllium) 13)  Vicodin 5-500 Mg Tabs (Hydrocodone-acetaminophen) .... By mouth as needed every 6 hrs. 14)  Phenergan 25 Mg/ml Soln (Promethazine hcl) .... Q12 hr as needed for nausea and vomitting. 15)  Colace 100 Mg Caps (Docusate sodium) .Marland Kitchen.. 1 by mouth two times a day as needed constipation.

## 2011-02-01 NOTE — Progress Notes (Addendum)
Summary: referral  Phone Note From Other Clinic   [facility] Summary of Call: RN from [facility] is calling for her ongoing wound care. Would like to see her 2x a week for 4 weeks. Pt needs a new a referral to the Wound care center. Army Fossa CMA  June 21, 2010 2:16 PM   Follow-up for Phone Call        referral put in Follow-up by: Loreen Freud DO,  June 21, 2010 3:34 PM

## 2011-02-01 NOTE — Miscellaneous (Addendum)
Summary: Care Plan/[facility]  Care Plan/[facility]   Imported By: Lanelle Bal 07/12/2010 10:55:57  _____________________________________________________________________  External Attachment:    Type:   Image     Comment:   External Document

## 2011-02-01 NOTE — Op Note (Addendum)
Summary: Perineal Proctosigmoidectomy/[facility]  Perineal Proctosigmoidectomy/[facility]   Imported By: Lanelle Bal 06/12/2010 10:37:58  _____________________________________________________________________  External Attachment:    Type:   Image     Comment:   External Document

## 2011-02-01 NOTE — Letter (Addendum)
Summary: [facility]   Imported By: Lanelle Bal 06/12/2010 10:44:40  _____________________________________________________________________  External Attachment:    Type:   Image     Comment:   External Document

## 2011-02-01 NOTE — Progress Notes (Addendum)
Summary: Wound care/[facility]  Phone Note From Other Clinic   Summary of Call: Pts home health nurse called to ask to be able to send pt back to Wound care center for her wounds. He has been going out every 3 days to change bandages and family has been helping in between. But he feels that she needs to go back to the Wound Care center. Please fax order to [phone]. Army Fossa CMA  June 09, 2010 11:13 AM   Follow-up for Phone Call        referral put in Follow-up by: Loreen Freud DO,  June 09, 2010 12:02 PM  Additional Follow-up for Phone Call Additional follow up Details #1::        faxed to home health agency. Army Fossa CMA  June 09, 2010 12:44 PM

## 2011-02-01 NOTE — Miscellaneous (Addendum)
Summary: OT SN & HHA Orders/[facility]  OT SN & HHA Orders/[facility]   Imported By: Lanelle Bal 06/08/2010 11:39:34  _____________________________________________________________________  External Attachment:    Type:   Image     Comment:   External Document

## 2011-02-01 NOTE — Miscellaneous (Addendum)
Summary: Discharge/[facility]  Discharge/[facility]   Imported By: Lanelle Bal 05/09/2010 08:17:29  _____________________________________________________________________  External Attachment:    Type:   Image     Comment:   External Document

## 2011-02-01 NOTE — Progress Notes (Addendum)
Summary: refill/O2 concerns  Phone Note Refill Request Call back at [phone] Message from:  daughter  Refills Requested: Medication #1:  tramadol 50mg  1 tab by mouth every 4 to 6 hours as needed 1)refill med: last OV 05-02-10, last filled 06-18-07 #90 3, removed from list 05-02-10  2)Pt daughter states that pt O2 at night was D/C while in nursing facility. Pt  daughter states that since pt was at home they have continue with O2 because it was part of her routine. However pt daughter would like to know if their is a need for O2 any more. pls advise.Marland KitchenMarland KitchenFelecia Deloach CMA  May 08, 2010 12:14 PM    Follow-up for Phone Call        Dr Shelle Iron was ordering her Oxygen so they need to check with pulmonary. tramadol was just filled--- she should not need refill already Follow-up by: Loreen Freud DO,  May 08, 2010 12:18 PM  Additional Follow-up for Phone Call Additional follow up Details #1::        tramadol has not been filled since 06-18-07 #90 3. pls advise on refill..........Marland KitchenFelecia Deloach CMA  May 08, 2010 12:33 PM     pt daughter aware to contact dr clance........Marland KitchenFelecia Deloach CMA  May 08, 2010 12:28 PM   pt son to fax over pharmacy info....Marland KitchenMarland KitchenFelecia Deloach CMA  May 08, 2010 12:29 PM     Additional Follow-up for Phone Call Additional follow up Details #2::    ok to refill tramadol x1 Follow-up by: Loreen Freud DO,  May 08, 2010 12:50 PM  New/Updated Medications: TRAMADOL HCL 50 MG TABS (TRAMADOL HCL) 1 by mouth every 4-6 hrs as needed. Prescriptions: TRAMADOL HCL 50 MG TABS (TRAMADOL HCL) 1 by mouth every 4-6 hrs as needed.  #90 x 0   Entered by:   Army Fossa CMA   Authorized by:   Loreen Freud DO   Signed by:   Army Fossa CMA on 05/08/2010   Method used:   Faxed to ...       [facility] (retail)             , [place]         Ph: [phone]       Fax: [phone]   RxID:   [id number]

## 2011-02-01 NOTE — Progress Notes (Addendum)
Summary: refill  Phone Note Refill Request Message from:  Fax from Pharmacy on September 05, 2010 10:28 AM  Refills Requested: Medication #1:  TRAMADOL HCL 50 MG TABS 1 by mouth every 4-6 hrs as needed. [facility] - fax [phone]  Initial call taken by: Okey Regal Spring,  September 05, 2010 10:29 AM  Follow-up for Phone Call        Rx filled 07/27/10 and Last OV 05/02/10. Please Advise Almeta Monas CMA Duncan Dull)  September 05, 2010 10:41 AM   Additional Follow-up for Phone Call Additional follow up Details #1::        refill x1  2 refills Additional Follow-up by: Loreen Freud DO,  September 05, 2010 10:53 AM    Additional Follow-up for Phone Call Additional follow up Details #2::    pt notified.     Almeta Monas CMA Duncan Dull)  September 05, 2010 11:47 AM   Prescriptions: TRAMADOL HCL 50 MG TABS (TRAMADOL HCL) 1 by mouth every 4-6 hrs as needed.  #90 x 2   Entered by:   Almeta Monas CMA (AAMA)   Authorized by:   Loreen Freud DO   Signed by:   Almeta Monas CMA (AAMA) on 09/05/2010   Method used:   Faxed to ...       [facility] (mail-order)       [address]       [place], [place]  [address]       Ph: [phone]       Fax: [phone]   RxID:   [id number]

## 2011-02-01 NOTE — Progress Notes (Addendum)
Summary: question about meds  Phone Note Call from Patient Call back at [phone]   Caller: Daughter Kerry Myers Call For: Kerry Freud DO Summary of Call: Patient was discharged from [facility] and has a f/u appt on Tuesday.  Patient is experiencing urinary problems and has Vesicare at home.  Daughter wants to know if its ok to start her on Vesicare until she is seen on Tuesday. Initial call taken by: Barnie Mort,  April 28, 2010 1:02 PM  Follow-up for Phone Call        Spoke with pt daughter, mother  has frequency was given [name] but had to d/c in Feb, had surgery, Was told by Dr Al Pimple Surgical ok to restart. Pt is not having any pain or pressure or uti signs per daughter.  Informed daughter if ok by Doctor to resume should be ok . Marland KitchenKandice Hams  April 28, 2010 2:48 PM  Follow-up by: [name],  April 28, 2010 2:48 PM

## 2011-02-01 NOTE — Miscellaneous (Addendum)
Summary: OT Orders/[facility]  OT Orders/[facility]   Imported By: Lanelle Bal 05/22/2010 09:12:53  _____________________________________________________________________  External Attachment:    Type:   Image     Comment:   External Document

## 2011-02-01 NOTE — Progress Notes (Addendum)
Summary: wound orders  Phone Note Call from Patient Call back at [phone],fax [phone]   Caller: tim henderson ([facility] homecare) Summary of Call: pt was discharged [facility] with orders for santyl ointment to heel but the wound continue to be pale and white.Tim states that Pt has in the past f/u with the wound care center. Tim would like to while waiting on consult from them get orders to apply calcium alginate once daily to the wound with instruction pt to f/u with wound center. Verbal Order or fax ok. Pls advise.................Mar[facility]Man[facility] Deloach CMA  May 11, 2010 1:29 PM    Initial call taken by: Jeremy Johann CMA,  May 11, 2010 1:22 PM  Follow-up for Phone Call        ok to give verbal order---does she need new referral for wound center? Follow-up by: Loreen Freud DO,  May 11, 2010 1:53 PM  Additional Follow-up for Phone Call Additional follow up Details #1::        Faxed order, called the number and there was not a working Engineer, technical sales. Army Fossa CMA  May 11, 2010 2:03 PM     New/Updated Medications: * WOUND CARE apply calcium alginate once daily to the wound with instruction pt to f/u with wound center. Prescriptions: WOUND CARE apply calcium alginate once daily to the wound with instruction pt to f/u with wound center.  #1 x 0   Entered by:   Army Fossa CMA   Authorized by:   Loreen Freud DO   Signed by:   Army Fossa CMA on 05/11/2010   Method used:   Print then Give to Patient   RxID:   [id number]

## 2011-02-01 NOTE — Miscellaneous (Addendum)
Summary: Care Plan/[facility]  Care Plan/[facility]   Imported By: Lanelle Bal 05/10/2010 13:06:28  _____________________________________________________________________  External Attachment:    Type:   Image     Comment:   External Document

## 2011-02-01 NOTE — Miscellaneous (Addendum)
Summary: Wound Care Order/[facility]  Wound Care Order/[facility]   Imported By: Lanelle Bal 06/15/2010 08:38:14  _____________________________________________________________________  External Attachment:    Type:   Image     Comment:   External Document

## 2011-02-01 NOTE — Letter (Addendum)
Summary: [facility]   Imported By: Lanelle Bal 06/12/2010 10:43:14  _____________________________________________________________________  External Attachment:    Type:   Image     Comment:   External Document

## 2011-02-16 ENCOUNTER — Telehealth: Payer: Self-pay | Admitting: Family Medicine

## 2011-02-19 ENCOUNTER — Telehealth: Payer: Self-pay | Admitting: Internal Medicine

## 2011-02-19 ENCOUNTER — Encounter: Payer: Self-pay | Admitting: Family Medicine

## 2011-02-19 ENCOUNTER — Ambulatory Visit: Payer: Self-pay | Admitting: Family Medicine

## 2011-02-19 ENCOUNTER — Ambulatory Visit: Payer: Medicare Other | Admitting: Family Medicine

## 2011-02-19 DIAGNOSIS — L84 Corns and callosities: Secondary | ICD-10-CM | POA: Insufficient documentation

## 2011-02-19 DIAGNOSIS — R1312 Dysphagia, oropharyngeal phase: Secondary | ICD-10-CM | POA: Insufficient documentation

## 2011-02-21 ENCOUNTER — Other Ambulatory Visit: Payer: Self-pay | Admitting: Physician Assistant

## 2011-02-21 ENCOUNTER — Other Ambulatory Visit: Payer: Self-pay | Admitting: Internal Medicine

## 2011-02-21 ENCOUNTER — Other Ambulatory Visit: Payer: Medicare Other

## 2011-02-21 ENCOUNTER — Ambulatory Visit (INDEPENDENT_AMBULATORY_CARE_PROVIDER_SITE_OTHER): Payer: Medicare Other | Admitting: Physician Assistant

## 2011-02-21 ENCOUNTER — Encounter: Payer: Self-pay | Admitting: Physician Assistant

## 2011-02-21 DIAGNOSIS — R51 Headache: Secondary | ICD-10-CM | POA: Insufficient documentation

## 2011-02-21 DIAGNOSIS — R1310 Dysphagia, unspecified: Secondary | ICD-10-CM

## 2011-02-21 DIAGNOSIS — R1312 Dysphagia, oropharyngeal phase: Secondary | ICD-10-CM

## 2011-02-21 DIAGNOSIS — K219 Gastro-esophageal reflux disease without esophagitis: Secondary | ICD-10-CM | POA: Insufficient documentation

## 2011-02-21 DIAGNOSIS — I2510 Atherosclerotic heart disease of native coronary artery without angina pectoris: Secondary | ICD-10-CM | POA: Insufficient documentation

## 2011-02-21 DIAGNOSIS — R1319 Other dysphagia: Secondary | ICD-10-CM | POA: Insufficient documentation

## 2011-02-21 DIAGNOSIS — R519 Headache, unspecified: Secondary | ICD-10-CM | POA: Insufficient documentation

## 2011-02-21 LAB — BASIC METABOLIC PANEL
Calcium: 9.6 mg/dL (ref 8.4–10.5)
GFR: 108.23 mL/min (ref >60.00)
Potassium: 4.7 mEq/L (ref 3.5–5.1)

## 2011-02-21 NOTE — Progress Notes (Addendum)
Summary: Dysphagia--declined ER  Phone Note Call from Patient   Summary of Call: Patient spouse called noting that she is having dysphagia and gets strangled with drinking fluids. He notes that she does not tell him this, instead she tells someone else. I advised that if the patient is choking on liquids she would have to be taken to the ER. He advised me that she is not that bad and will not need the ER. He will bring her to see [name] on Monday.  Initial call taken by: Lucious Groves CMA,  February 16, 2011 4:48 PM

## 2011-02-23 ENCOUNTER — Other Ambulatory Visit: Payer: Medicare Other

## 2011-02-23 ENCOUNTER — Ambulatory Visit (INDEPENDENT_AMBULATORY_CARE_PROVIDER_SITE_OTHER)
Admission: RE | Admit: 2011-02-23 | Discharge: 2011-02-23 | Disposition: A | Discharge status: Home / Self Care | Payer: Medicare Other | Source: Ambulatory Visit | Attending: Internal Medicine | Admitting: Internal Medicine

## 2011-02-23 ENCOUNTER — Encounter: Payer: Self-pay | Admitting: Internal Medicine

## 2011-02-23 DIAGNOSIS — R51 Headache: Secondary | ICD-10-CM

## 2011-02-23 DIAGNOSIS — R1310 Dysphagia, unspecified: Secondary | ICD-10-CM

## 2011-02-23 MED ORDER — IOHEXOL 300 MG/ML  SOLN
80.0000 mL | Freq: Once | INTRAMUSCULAR | Status: AC | PRN
  Administered 2011-02-23: 80 mL via INTRAVENOUS

## 2011-02-27 NOTE — Assessment & Plan Note (Addendum)
Summary: dysphagia per spouse/declined er/kb--moved from wrong MRN/kn   Vital Signs:  Patient profile:   77 year old female Height:      56.5 inches Weight:      91.2 pounds BMI:     20.16 Temp:     98.0 degrees F oral BP sitting:   92 / 60  (left arm) Cuff size:   regular  Vitals Entered By: Almeta Monas CMA Duncan Dull) (February 19, 2011 11:47 AM) CC: C/O DIFFICULTY SWALLOWING   History of Present Illness: Pt here with son and caretaker c/o trouble swallowing ,  pt feels like she has a fullness inside her mouth and struggles to chew food and swallow.  She is able to drink with small sips.  Pt also c/o "ball" in groin that is tender---it has been there a while. She never told her son about it. She is also c/o callouses on feet causing pain.--- that has also been there for a long time.    Problems Prior to Update: 1)  ? of Inguinal Hernia, Left  (ICD-550.90) 2)  Calluses, Feet, Bilateral  (ICD-700) 3)  Dysphagia, Oropharyngeal Phase  (KGM-010.27)  Medications Prior to Update: 1)  None  Current Medications (verified): 1)  Balmex 11.3 % Crea (Zinc Oxide) .... Apply As Directed 2)  Omeprazole 40 Mg Cpdr (Omeprazole) .Marland Kitchen.. 1 By Mouth Once Daily 3)  Mirapex 0.25 Mg Tabs (Pramipexole Dihydrochloride) .Marland Kitchen.. 1 By Mouth At Bedtime 4)  Vesicare 10 Mg Tabs (Solifenacin Succinate) .... 5 in The Am and 5 in The Pm 5)  Gabapentin 100 Mg Caps (Gabapentin) .Marland Kitchen.. 1 By Mouth Three Times A Day 6)  Vitamin D 1000 Unit Tabs (Cholecalciferol) .... By Mouth Once Daily 7)  Metanx 3-35-2 Mg Tabs (L-Methylfolate-B6-B12) .Marland Kitchen.. 1 By Mouth Once Daily 8)  Metamucil 30.9 % Powd (Psyllium) .... As Directed 9)  Vicodin 5-500 Mg Tabs (Hydrocodone-Acetaminophen) .... By Mouth Every 4-6 Hours As Needed 10)  Phenergan 25 Mg/ml Soln (Promethazine Hcl) .... Every 12 Hours As Needed 11)  Colace 100 Mg Caps (Docusate Sodium) .Marland Kitchen.. 1 By Mouth Two Times A Day As Needed 12)  Tramadol Hcl 50 Mg Tabs (Tramadol Hcl) .Marland Kitchen.. 1 By  Mouth Every 4-6 Hours As Needed  Allergies (verified): No Known Drug Allergies   Impression & Recommendations:  Problem # 1:  CALLUSES, FEET, BILATERAL (ICD-700)  Orders: Podiatry Referral (Podiatry)  Problem # 2:  DYSPHAGIA, OROPHARYNGEAL PHASE (OZD-664.40)  Orders: Gastroenterology Referral (GI)  Problem # 3:  CALLUSES, FEET, BILATERAL (ICD-700)  Orders: Podiatry Referral (Podiatry)  Problem # 4:  DYSPHAGIA, OROPHARYNGEAL PHASE (HKV-425.95)  Orders: Gastroenterology Referral (GI)  Problem # 5:  ? of INGUINAL HERNIA, LEFT (ICD-550.90)  Orders: Surgical Referral (Surgery)  Complete Medication List: 1)  Balmex 11.3 % Crea (Zinc oxide) .... Apply as directed 2)  Omeprazole 40 Mg Cpdr (Omeprazole) .Marland Kitchen.. 1 by mouth once daily 3)  Mirapex 0.25 Mg Tabs (Pramipexole dihydrochloride) .Marland Kitchen.. 1 by mouth at bedtime 4)  Vesicare 10 Mg Tabs (Solifenacin succinate) .... 5 in the am and 5 in the pm 5)  Gabapentin 100 Mg Caps (Gabapentin) .Marland Kitchen.. 1 by mouth three times a day 6)  Vitamin D 1000 Unit Tabs (Cholecalciferol) .... By mouth once daily 7)  Metanx 3-35-2 Mg Tabs (L-methylfolate-b6-b12) .Marland Kitchen.. 1 by mouth once daily 8)  Metamucil 30.9 % Powd (Psyllium) .... As directed 9)  Vicodin 5-500 Mg Tabs (Hydrocodone-acetaminophen) .... By mouth every 4-6 hours as needed 10)  Phenergan 25 Mg/ml Soln (  Promethazine hcl) .... Every 12 hours as needed 11)  Colace 100 Mg Caps (Docusate sodium) .Marland Kitchen.. 1 by mouth two times a day as needed 12)  Tramadol Hcl 50 Mg Tabs (Tramadol hcl) .Marland Kitchen.. 1 by mouth every 4-6 hours as needed   Orders Added: 1)  Surgical Referral [Surgery] 2)  Podiatry Referral [Podiatry] 3)  Gastroenterology Referral [GI]

## 2011-02-27 NOTE — Assessment & Plan Note (Addendum)
Summary: Dysphagia/No show copay/[facility]   History of Present Illness Visit Type: Initial Consult Primary GI MD: Lina Sar MD Primary Provider: Loreen Freud, DO Requesting Provider: Loreen Freud, DO Chief Complaint: Dysphagia with solids and liquids, mouth feels swollen, and loss of appetite  History of Present Illness:   VERY NICE 77 YO FEMALE REFERRED FOREVALUATION OF DYSPHAGIA. ACCOMPANIED BY HER SON, AND A CAREGIVER/AIDE. PT REPORTS THAT SHE REALLY DOES NOT HAVE ANY DIFFICULTY SWALLOWING AS LONG AS SHE EATS AND DRINKS SLOWLY AND CUTS HER FOOD UP WELL. SHE SAY SHE GETS PILLS DOWN FINE WITH APPLESAUCE . IF SHE SWALLOWS TOO FAST/TOO MUCH ETC SHE WILL COUGH AND CHOKE, AND THINGS WILL COME OUT HER NOSE.  SHE IS VERY THIN ,BUT WEIGHT HAS BEEN STABLE BETWEEN 88-92 POUNDS PAST COUPLE YEARS. SHE DENIES ANY ODYNOPHAGIA, NO ABDOMINAL PAIN.  SHE SAYS SHE HAS DISCOMFORT AND A" FULLNESS" OR SWOLLEN FEELING ON THE LEFT SIDE OF HER FACE THAT AFFECTS HER UPPER PALATE/GUMS . SHE FEELS THIS IS RELATED TO HER SWALLOWING AND SEEMS TO AFFECT HER SPEECH. THIS HAS BEEN PRESENT AT LEAST A YEAR.  NO HEARTBURN OR INDIGESTION. NO HX OF KNOWN CVA'S /TIAS. SHE APPARENTLY DID HAVE SOME REFLUX ISSUES BUT HAS BEEN ON OMEPRAZOLE  WHICH HELPS.  NO PRIOR EGD HERE . REVIEW OF PAPER CHART ;NOTE FROM DR. HOPPER IN 2004 STATES PT HAD EGD AND COLON WITH DR. ORR.   GI Review of Systems    Reports acid reflux, dysphagia with liquids, dysphagia with solids, heartburn, and  loss of appetite.      Denies abdominal pain, belching, bloating, chest pain, nausea, vomiting, vomiting blood, weight loss, and  weight gain.      Reports change in bowel habits, constipation, rectal bleeding, and  rectal pain.     Denies anal fissure, black tarry stools, diarrhea, diverticulosis, fecal incontinence, heme positive stool, hemorrhoids, irritable bowel syndrome, jaundice, light color stool, and  liver problems.    Current Medications (verified): 1)   Neurontin 100 Mg Caps (Gabapentin) .Marland Kitchen.. 1 By Mouth Three Times A Day 2)  Vitamin D 1000 Unit Tabs (Cholecalciferol) .Marland Kitchen.. 1 By Mouth Once Daily 3)  Multivitamins   Tabs (Multiple Vitamin) .... One Tablet By Mouth Once Daily 4)  Wound Care .... Apply Calcium Alginate Once Daily To The Wound With Instruction Pt To F/u With Wound Center. 5)  Balmex 11.3 % Crea (Zinc Oxide) .... Apply As Directed 6)  Omeprazole 40 Mg Cpdr (Omeprazole) .Marland Kitchen.. 1 By Mouth Once Daily 7)  Mirapex 0.25 Mg Tabs (Pramipexole Dihydrochloride) .Marland Kitchen.. 1 By Mouth At Bedtime 8)  Vesicare 10 Mg Tabs (Solifenacin Succinate) .... 5 in The Am and 5 in The Pm 9)  Metanx 3-35-2 Mg Tabs (L-Methylfolate-B6-B12) .Marland Kitchen.. 1 By Mouth Once Daily 10)  Metamucil 30.9 % Powd (Psyllium) .... As Directed 11)  Vicodin 5-500 Mg Tabs (Hydrocodone-Acetaminophen) .... By Mouth Every 4-6 Hours As Needed 12)  Colace 100 Mg Caps (Docusate Sodium) .Marland Kitchen.. 1 By Mouth Two Times A Day As Needed 13)  Tramadol Hcl 50 Mg Tabs (Tramadol Hcl) .Marland Kitchen.. 1 By Mouth Every 4-6 Hours As Needed  Allergies (verified): No Known Drug Allergies  Past History:  Past Medical History: Osteoarthritis CAD-S/P CABG 98 Osteoporosis mild pulm HTN + TR- nod  Incontinence (Pessory) Scoliosis GERD  Past Surgical History: Cataract extraction Hysterectomy and bladder sling--rectocele repair (2006) Tonsillectomy proctosigmoidectomy with levatorplasty--- [facility]  02/03/2010-- Dr Al Pimple surgical CABG 1998 VENTRAL HERNIA REPAIR  Family History: No  FH of Colon Cancer:  Social History: Retired Widowed Her 2 sons live w/ her Patient never smoked  Review of Systems       The patient complains of arthritis/joint pain, back pain, confusion, cough, fatigue, hearing problems, swelling of feet/legs, urination - excessive, and urine leakage.  The patient denies allergy/sinus, anemia, anxiety-new, blood in urine, breast changes/lumps, change in vision, coughing up blood,  depression-new, fainting, fever, headaches-new, heart murmur, heart rhythm changes, itching, menstrual pain, muscle pains/cramps, night sweats, nosebleeds, pregnancy symptoms, shortness of breath, skin rash, sleeping problems, sore throat, swollen lymph glands, thirst - excessive , urination - excessive , urination changes/pain, vision changes, and voice change.         SEE HPI  Vital Signs:  Patient profile:   77 year old female Height:      56.5 inches Weight:      89 pounds BMI:     19.67 BSA:     1.27 Pulse rate:   60 / minute Pulse rhythm:   regular BP sitting:   100 / 60  (left arm) Cuff size:   regular  Vitals Entered By: Ok Anis CMA (February 21, 2011 9:09 AM)  Physical Exam  General:  Well developed, ,FRAIL THIN ELDERLY, no acute distress. Head:  Normocephalic and atraumatic. Eyes:  PERRLA, no icterus. Mouth:  No deformity or lesions,DENTURES IN , NO VISIBLE ULCERATION,EDEMA OF GUMS UPPER PALATE,BUCCAL MUCOSA Neck:  Supple; no masses or thyromegaly. Lungs:  Clear throughout to auscultation. Heart:  Regular rate and rhythm; no murmurs, rubs,  or bruits. Abdomen:  SOFT, NONTENDER, NO MASS OR HSM,BS+ Rectal:  NOT DONE Extremities:  No clubbing, cyanosis, edema or deformities noted. Neurologic:  SLIGHT ASSYMETRY TO MOUTH,TONGUE SLIGHTLY TO LEFT Psych:  Alert and cooperative. Normal mood and affect.   Impression & Recommendations:  Problem # 1:  DYSPHAGIA (ICD-787.29) Assessment Unchanged 77  YO FEMALE WITH PROBABLE NEUROGENIC DYSPHAGIA, AND ASSOCIATED FACIAL DISCOMFORT ON LEFT- R/O NEUROPATHY,LESION,PREVIOUS CVA.  CT OF FACE, AND HEAD DEPENDING ON FINDING MAY NEED MODIFIED SWALLOWING STUDY WITH SPEECH PATH-SHE SEEMS TO COMPENSATE WELL AND IS NOT REALLY INTERESTED IN PURSUING THIS  AS  SHE FEELS SHE SWALLOWS FINE. SHE WOULD NOT BE INTERESTED IN AN ENDOSCOPY EITHER THOUGH I AGREE  I DO NOT SEE INDICATION FOR THIS CURRENTLY.  Problem # 2:  GERD  (ICD-530.81) Assessment: Comment Only STABLE  Problem # 3:  PULMONARY HYPERTENSION, MILD (ICD-416.8) Assessment: Comment Only  Problem # 4:  PERIPHERAL NEUROPATHY (ICD-356.9) Assessment: Comment Only  Problem # 5:  CORONARY ARTERY DISEASE (ICD-414.00) Assessment: Comment Only  Other Orders: TLB-BMP (Basic Metabolic Panel-BMET) (80048-METABOL) GI Misc Procedure/ Radiology Order (GI Misc )  Patient Instructions: 1)  Please go to lab, basement level. 2)  We have scheduled the CT of the Head at [facility]. 3)  Date is Fri 02-23-2011. 4)  Directions given. 5)  Copy sent to : Loreen Freud, DO 6)  The medication list was reviewed and reconciled.  All changed / newly prescribed medications were explained.  A complete medication list was provided to the patient / caregiver.

## 2011-02-27 NOTE — Progress Notes (Addendum)
Summary: Doc of the day  Phone Note From Other Clinic   Caller: Renee @ [facility] [phone] Call For: Dr Marina Goodell Summary of Call: pt. is having problems swallowing/dysphagia.....requesting sooner appt. than next avail. Initial call taken by: Karna Christmas,  February 19, 2011 3:32 PM  Follow-up for Phone Call        Patient scheduled to see Mike Gip, PA 02/21/11@9am . Scheduled with Luster Landsberg, she will notify patient of appointment date and time. Follow-up by: Selinda Michaels RN,  February 19, 2011 4:18 PM

## 2011-03-09 ENCOUNTER — Telehealth: Payer: Self-pay | Admitting: Family Medicine

## 2011-03-18 LAB — URINALYSIS, ROUTINE W REFLEX MICROSCOPIC
Glucose, UA: NEGATIVE mg/dL
Ketones, ur: NEGATIVE mg/dL
Nitrite: NEGATIVE
Specific Gravity, Urine: 1.024 (ref 1.005–1.030)
Urobilinogen, UA: 0.2 mg/dL (ref 0.0–1.0)

## 2011-03-18 LAB — DIFFERENTIAL
Basophils Absolute: 0 10*3/uL (ref 0.0–0.1)
Eosinophils Absolute: 0.2 10*3/uL (ref 0.0–0.7)
Eosinophils Relative: 2 % (ref 0–5)
Lymphocytes Relative: 19 % (ref 12–46)
Lymphs Abs: 1.3 10*3/uL (ref 0.7–4.0)
Monocytes Absolute: 0.6 10*3/uL (ref 0.1–1.0)

## 2011-03-18 LAB — CBC
HCT: 37.6 % (ref 36.0–46.0)
Hemoglobin: 12.3 g/dL (ref 12.0–15.0)
MCV: 97.6 fL (ref 78.0–100.0)
Platelets: 218 10*3/uL (ref 150–400)
RDW: 14.1 % (ref 11.5–15.5)

## 2011-03-18 LAB — URINE MICROSCOPIC-ADD ON

## 2011-03-18 LAB — BASIC METABOLIC PANEL
BUN: 22 mg/dL (ref 6–23)
Chloride: 106 mEq/L (ref 96–112)
GFR calc non Af Amer: >60 mL/min (ref >60)
Glucose, Bld: 89 mg/dL (ref 70–99)
Potassium: 3.7 mEq/L (ref 3.5–5.1)
Sodium: 140 mEq/L (ref 135–145)

## 2011-03-20 NOTE — Progress Notes (Signed)
Summary: refill-lowne patient  Phone Note Refill Request Call back at [phone] Message from:  Patient son [name])  Refills Requested: Medication #1:  TRAMADOL HCL 50 MG TABS 1 by mouth every 4-6 hours as needed. prescription solution. Pt son states that #90 is not lasting Pt 3 month. Pt son is requesting a increase in disp #. last OV 02-19-10, last filled 09-05-10 #90 2 Pls advise ............Marland KitchenFelecia Deloach CMA  March 09, 2011 11:16 AM    Follow-up for Phone Call        unable to increase the amount given- will need to address this w/ PCP when she returns.  can refill #90 at this time or he can wait until Dr Laury Axon returns on Monday and discuss this w/ her. Follow-up by: Neena Rhymes MD,  March 09, 2011 11:59 AM  Additional Follow-up for Phone Call Additional follow up Details #1::        Pls advise on disp# increase......Marland KitchenFelecia Deloach CMA  March 09, 2011 12:46 PM   Rx sent in to pharmacy, aware out of office until monday.......Marland KitchenFelecia Deloach CMA  March 09, 2011 12:46 PM   pt son aware.............Marland KitchenFelecia Deloach CMA  March 12, 2011 9:28 AM     Additional Follow-up for Phone Call Additional follow up Details #2::    increase amount to #180, no refills----- if pain is increasing she needs ov Follow-up by: Loreen Freud DO,  March 11, 2011 11:31 AM  Prescriptions: TRAMADOL HCL 50 MG TABS (TRAMADOL HCL) 1 by mouth every 4-6 hours as needed  #180 x 0   Entered by:   Jeremy Johann CMA   Authorized by:   Loreen Freud DO   Signed by:   Jeremy Johann CMA on 03/12/2011   Method used:   Faxed to ...       [facility]* (mail-order)       [address]       [place], [place]  [address]       Ph: [phone]       Fax: [phone]   RxID:   [id number] TRAMADOL HCL 50 MG TABS (TRAMADOL HCL) 1 by mouth every 4-6 hours as needed  #90 x 0   Entered by:   Jeremy Johann CMA   Authorized by:   Neena Rhymes MD   Signed by:   Jeremy Johann CMA on  03/09/2011   Method used:   Faxed to ...       [facility]* (mail-order)       [address]       [place], [place]  [address]       Ph: [phone]       Fax: [phone]   RxID:   [id number]

## 2011-03-29 ENCOUNTER — Ambulatory Visit: Payer: PRIVATE HEALTH INSURANCE | Admitting: Gastroenterology

## 2011-04-17 LAB — COMPREHENSIVE METABOLIC PANEL
AST: 29 U/L (ref 0–37)
Albumin: 2.7 g/dL — ABNORMAL LOW (ref 3.5–5.2)
BUN: 20 mg/dL (ref 6–23)
Creatinine, Ser: 0.63 mg/dL (ref 0.4–1.2)
GFR calc Af Amer: >60 mL/min (ref >60)
Total Protein: 6.5 g/dL (ref 6.0–8.3)

## 2011-04-17 LAB — DIFFERENTIAL
Basophils Absolute: 0 10*3/uL (ref 0.0–0.1)
Lymphocytes Relative: 14 % (ref 12–46)
Lymphs Abs: 1.2 10*3/uL (ref 0.7–4.0)
Monocytes Absolute: 0.6 10*3/uL (ref 0.1–1.0)
Monocytes Relative: 8 % (ref 3–12)
Neutro Abs: 6.4 10*3/uL (ref 1.7–7.7)

## 2011-04-17 LAB — URINALYSIS, ROUTINE W REFLEX MICROSCOPIC
Bilirubin Urine: NEGATIVE
Glucose, UA: NEGATIVE mg/dL
Hgb urine dipstick: NEGATIVE
Nitrite: NEGATIVE
Specific Gravity, Urine: 1.022 (ref 1.005–1.030)
pH: 7 (ref 5.0–8.0)

## 2011-04-17 LAB — CBC
HCT: 33.6 % — ABNORMAL LOW (ref 36.0–46.0)
MCV: 93.3 fL (ref 78.0–100.0)
Platelets: 247 10*3/uL (ref 150–400)
RDW: 14.2 % (ref 11.5–15.5)

## 2011-04-20 ENCOUNTER — Telehealth: Payer: Self-pay | Admitting: Family Medicine

## 2011-04-20 MED ORDER — OMEPRAZOLE 40 MG PO CPDR: 40.0000 mg | DELAYED_RELEASE_CAPSULE | Freq: Every day | ORAL | Status: DC

## 2011-04-20 NOTE — Telephone Encounter (Signed)
Patient needs refill for omeprazole 40mg  - [facility]   --- 30 day supply [facility]

## 2011-05-15 NOTE — Procedures (Signed)
DUPLEX DEEP VENOUS EXAM - LOWER EXTREMITY   INDICATION:  Lower extremity swelling, L > R.   HISTORY:  Edema:  Bilateral, L > R.  Trauma/Surgery:  No.  Pain:  Yes, bilateral.  PE:  No.  Previous DVT:  No, per patient.  Anticoagulants:  No.  Other:  Left lower extremity growth and Morton's neuroma.   DUPLEX EXAM:                CFV   SFV   PopV   PTV   GSV                R  L  R  L  R  L   R  L  R  L  Thrombosis    O  o  +  O  o  o   o  +  +  O  Spontaneous   +  +  +  +  +  +   +  +  +  +  Phasic        +  +  +  +  +  +   +  +  +  +  Augmentation  +  +  +  +  +  +   +  +  +  +  Compressible  +  +  P  +  +  +   +  P  P  +  Competent     +  +  +  +  +  +   +  +  +  +   Legend:  + - yes  o - no  p - partial  D - decreased   IMPRESSION:  1. Evidence of minimal chronic deep venous thrombosis in right      superficial femoral vein and left posterior tibial vein.  All other      imaged deep veins appear patent.  2. Evidence of chronic superficial venous thrombosis in right greater      saphenous vein and bilateral short saphenous veins.  3. No evidence of venous insufficiency bilaterally.    _____________________________  V. Charlena Cross, MD   AS/MEDQ  D:  06/07/2008  T:  06/07/2008  Job:  [id number]

## 2011-05-15 NOTE — Assessment & Plan Note (Signed)
[facility]   NAME:  Kerry Myers, Kerry Myers                ACCOUNT NO.:  [id number]   MEDICAL RECORD NO.:  [id number]      DATE OF BIRTH:  [birth year]   PHYSICIAN:  Leonie Man, M.D.    VISIT DATE:  08/01/2009                                   OFFICE VISIT   PROBLEM:  Nonhealing wound over the dorsum of the left ankle in this 77-  year-old patient.  This wound has been nonhealing in that it is within  the crease of the ankle where maximal movement is initiated while  walking.  Current therapy has been with Promogran and Allevyn pad over  this area.   The patient is not having any symptoms of pain or discomfort and she has  no systemic symptoms.  On examination today, the patient is afebrile and  temperature 97.7, pulse 74, respirations 18, blood pressure is 125/62.  She is alert, oriented, and not in any acute distress.   The wound appears now to be completely epithelialized even though there  is a small sunken defect in the wound.  There is no odor or drainage.  There is still some residual mild erythema around the wound.   Treatment today, put some mupirocin cream around this area of erythema,  dress it with an Allevyn pad.  I will follow up with her in 2 weeks.  She is to call if indeed the wound worsens.  She should change her  dressings every other day.      Leonie Man, M.D.  Electronically Signed     PB/MEDQ  D:  08/01/2009  T:  08/02/2009  Job:  [id number]

## 2011-05-15 NOTE — Assessment & Plan Note (Signed)
[facility]   NAME:  Kerry Myers, Kerry Myers                ACCOUNT NO.:  [id number]   MEDICAL RECORD NO.:  [id number]      DATE OF BIRTH:  12-05-1919   PHYSICIAN:  Leonie Man, M.D.    VISIT DATE:  04/11/2009                                   OFFICE VISIT   PROBLEM:  [name], 77 year old lady, who had a small blister on the  anterior aspect of her ankle of the left leg, which ruptured into a  linear ulcer.  We have been treating this with Santyl and moist gauze  dressings over that ulcer.  Additionally, she had had a reddened third  toe, it is on the right foot with small callus over this.  This has no  skin break and  lookes like an osteoarthritic nodule.   On examination today, the ulcer over the ankle measures approximately  0.4 x 0.2.  There is no odor or drainage.  This is just about to resolve  at this point.  She is selectively debrided to remove the eschar over  the ulcer.  We will treat this with hydrogel with a dry sterile dressing  and put some skin barrier cream on her and will follow up with her in 2  weeks.       Leonie Man, M.D.  Electronically Signed     PB/MEDQ  D:  04/11/2009  T:  04/12/2009  Job:  [id number]

## 2011-05-15 NOTE — Assessment & Plan Note (Signed)
[facility]   NAME:  Kerry Myers, Kerry Myers                ACCOUNT NO.:  [id number]   MEDICAL RECORD NO.:  [id number]      DATE OF BIRTH:  [birth year]   PHYSICIAN:  Leonie Man, M.D.    VISIT DATE:  08/22/2009                                   OFFICE VISIT   PROBLEM:  Nonhealing wound over the dorsum of the left ankle in this 77-  year-old patient.   The wound is within the crease of the ankle where there was constant  movement at the heels and recurs on several occasions.  This wound  measures now 0.7 x 0.2 x less than 0.1.  Most recently treated with  Promogran and an Allevyn pad.  She returns now for evaluation.   PHYSICAL EXAMINATION:  VITAL SIGNS:  Temperature 98.2, pulse 69,  respirations 16, blood pressure 124/77.   The wound is now covered with some necrotic debris.  It seems slightly  wider than when it was last seen.  This is selectively debrided with a  curette less than 20 cm.  This was done without anesthesia.  The patient  tolerated the procedure well.  I have placed some Santyl wet-to-dry  dressing in the wound to be changed daily and followup with the patient  in 1 week.      Leonie Man, M.D.  Electronically Signed     PB/MEDQ  D:  08/22/2009  T:  08/23/2009  Job:  [id number]

## 2011-05-15 NOTE — Assessment & Plan Note (Signed)
[facility]   NAME:  Kerry Myers, Kerry Myers                ACCOUNT NO.:  [id number]   MEDICAL RECORD NO.:  [id number]      DATE OF BIRTH:  1919-03-08   PHYSICIAN:  Leonie Man, M.D.    VISIT DATE:  06/06/2009                                   OFFICE VISIT   PROBLEM:  Ulcer of the left ankle, current dimensions 1.0 x 0.5 x 0.1  cm, which is unchanged from her most recent visit on May 16, 2009.  The  patient returns today for followup.   The current treatment has been with hydrogel and an Allevyn pad.   On examination today, the patient's temperature is 97.9, pulse 71,  respirations 17, and blood pressure is 117/71.  The wound edges are  clean.  There is no surrounding erythema.  There is no odor or drainage.  The base of the wound has necrotic tissue in it.   TREATMENT:  Selective debridement of the base of the wound followed by  hydrogel and Promogran.  The patient has some leg swelling.  We will put  a Kerlix and Coban on this for reduction in leg edema.  Followup will be  in 1 week.      Leonie Man, M.D.  Electronically Signed     PB/MEDQ  D:  06/06/2009  T:  06/07/2009  Job:  [id number]

## 2011-05-15 NOTE — Assessment & Plan Note (Signed)
[facility]   NAME:  Kerry Myers, Kerry Myers                ACCOUNT NO.:  [id number]   MEDICAL RECORD NO.:  [id number]      DATE OF BIRTH:  Jan 07, 1919   PHYSICIAN:  Leonie Man, M.D.    VISIT DATE:  03/07/2009                                   OFFICE VISIT   PROBLEM:  Nonhealing wound of the left ankle.   HISTORY OF PRESENT ILLNESS:  The patient is an 77 year old lady who  developed a blister on her ankle which subsequently developed into an  ulcer of this area.  She had an duplex ultrasound that was negative for  DVT.  Last week, she was cultured and debrided with some difficulty.  CURRENT RX  She was treated this on her last visit with Puracol Ag and hydrogel with  a dry dressing.  The patient also has a small superficial wound of the  right calf which was treated with a dry dressing.   PHYSICAL EXAMINATION:  Today, the patient is alert and feeling generally  well.  Her temperature is 98.3, pulse is 71, respirations 18, and blood  pressure 107/76.  The wound of the left ankle shows some areas of  erythema around it and significant for fibrin and necrotic tissue down  within the depths of the wound.   TREATMENT  1-Field block on this ankle by first applying Betadine and then injected  this with 2% lidocaine plain.  2-Tissue curettage to debride down into the subcutaneous .  This is  significantly deeper than it first appeared.  Most of the necrotic  debris was removed.  There is still some fibrin down within the depths  of the wound.  3-Applied Santyl and a damp dressing to this.  Kerlix and Coban to maintain compression.  4-Right Leg: dry dressing and  Tube Grip to maintain compression on that  side.  DISP  F/U 1 week  We will follow up with her in 1 week.      Leonie Man, M.D.  Electronically Signed     PB/MEDQ  D:  03/07/2009  T:  03/07/2009  Job:  [id number]

## 2011-05-15 NOTE — Assessment & Plan Note (Signed)
[facility]   NAME:  Kerry Myers, ROZEBOOM NO.:  [id number]   MEDICAL RECORD NO.:  [id number]      DATE OF BIRTH:  23-Mar-1919   PHYSICIAN:  Maxwell Caul, M.D. VISIT DATE:  02/24/2009                                   OFFICE VISIT   Mrs. Plitt is a 77 year old woman who arrived accompanied by her son  and granddaughter.  She is here for review of an ulcer on her left  anterior ankle.  She is referred by Loreen Freud at [place], [facility].   Mrs. Sako tells me that she developed a small blister on her left  anterior ankle about 6 months ago.  At that time, she also developed  significant swelling of her left leg.  The wound itself eventually  ruptured and opened into an ulcer that has really been for more or less  unchanged for the last several months.  I have documentation of a duplex  ultrasound from June 07, 2008 which did not show evidence of venous  insufficiency.  Also I had a duplex ultrasound on January 24, 2009 which  showed no evidence of a DVT, superficial thrombosis or a Baker cyst.  She tolerated compression stockings on the left leg very badly although  she apparently has been able to tolerate the Ace wraps.  I note that Dr.  Laury Axon gave her a course of Keflex early this month although I do not see  any culture results.  There is apparently some improvement with the  Keflex.   PAST MEDICAL HISTORY:  1. Scoliosis.  2. Urinary incontinence.  3. Bilateral cataract surgery.  4. Bilateral hernia repair.  5. COPD on chronic oxygen.  6. Osteoarthritis.  7. Osteoporosis.   Medication list is reviewed.  She is taking Lasix 20 mg a day.  She is  also on Protonix, Mirapex, Lyrica, Vesicare and p.r.n. medications.   PHYSICAL EXAMINATION:  VITAL SIGNS:  An 77 year old alert woman.  She is  in no distress.  Her temperature was 98.4, pulse 83, respirations 20,  blood pressure is 132/87.  GENERAL:  Significant kyphosis.  She has poor  air entry bilaterally but  no crackles or wheezing.  The work of breathing is normal.  CARDIAC:  Heart sounds were normal.  There was no murmurs.  No increase in jugular  venous pressure and no signs of congestive heart failure.  LYMPH:  Nonpalpable.  EXTREMITIES:  Her peripheral pulses were palpable at the dorsalis pedis.  She has significant edema of the left leg just below the knee.  There is  some warmth and some tenderness.  She has much lesser degrees of edema  in the right leg.   WOUND EXAM:  She has two areas, one is on the dorsal aspect of the left  ankle anteriorly, this had a necrotic base to it.  I debrided callus  from around the surface of the wound and removed necrotic material from  the wound base as much as I could as even with injectable lidocaine  she  tolerated this badly.  There was some erythema around the wound but  generally did not appear to be infected currently.  She also has a small  venous stasis looking wound on the  right medial leg at roughly the mid  aspect.   IMPRESSION:  1. Largely venous stasis ulceration of the left anterior ankle.  This      was debrided and cleaned as noted above.  She will require a Santyl      and some degree of compression.  I had my doubts that she would      tolerate a full Unna wrap although we did do ABIs showing a left      ABI of 0.92 and triphasic waves.  In any case, I elected to put      Santyl on this wound with a dry dressing Kerlix and Coban.  I did      culture the base of the wound for culture and sensitivity but did      not put her on empiric antibiotics.  To the wound on the right leg      which is tiny and superficial, I simply recommended Polysporin and      a dry dressing.  2. Significant edema of the left leg greater than the right.  She has      had two negative vascular venous evaluation what I can see that did      not show evidence of a venous clot.  There does not appear to be      significant  cellulitis at least by my review today, although when      she saw Dr. Laury Axon some 2 weeks ago she was felt to have an      infection around the wound and put on Keflex although this does not      seem to have helped the edema.  She is not a diabetic.  I would      proceed to do an enhanced CT scan and/or an MRI of the leg.  There      are unusual medical conditions that can mimic deep vein thrombosis      fairly closely including sarcomas etc.  Beyond this, I really do      not have a clear reason why the left leg has been persistently      swollen over the last 6 months.  I discussed this in detail with      the patient and her son.   She will be coming back to see [place] again on Monday.  At that point, we  should have results of the culture to see how she has tolerated the leg  compression I gave her.           ______________________________  Maxwell Caul, M.D.     MGR/MEDQ  D:  02/24/2009  T:  02/25/2009  Job:  [id number]   cc:   Lelon Perla, DO

## 2011-05-15 NOTE — Assessment & Plan Note (Signed)
[facility]   NAME:  Kerry Myers, Kerry Myers                ACCOUNT NO.:  [id number]   MEDICAL RECORD NO.:  [id number]      DATE OF BIRTH:  12-06-1919   PHYSICIAN:  Leonie Man, M.D.    VISIT DATE:  06/27/2009                                   OFFICE VISIT   PROBLEM:  Nonhealing wound, left ankle in this 77 year old lady.  She  was last seen approximately 1 week ago where she was treated with a  Profore Lite boot.  She returns today for reevaluation.  She notes that  she was having some mild tenderness around the wound.   PHYSICAL EXAMINATION:  VITAL SIGNS:  Today, she is afebrile with a  temperature of 98.3, pulse 73, respirations 17, and blood pressure  133/74.  The wound shows significant slough within the base and significant  clotting up of tissue around the wound edge.   TREATMENT:  The wound was prepped with Betadine and infiltrated with 1%  lidocaine plain.  Using the curette, a selective debridement was carried  out cleaning this wound down to a clean granulating base.  Dressing of  hydrogel and Promogran placed within the wound and a sterile dressing  was applied.  I will discontinue her Profore Lites for now.  The patient  is to return in 1 week.      Leonie Man, M.D.  Electronically Signed     PB/MEDQ  D:  06/27/2009  T:  06/28/2009  Job:  [id number]

## 2011-05-15 NOTE — Assessment & Plan Note (Signed)
[facility]   NAME:  Kerry Myers, Kerry Myers                ACCOUNT NO.:  [id number]   MEDICAL RECORD NO.:  [id number]      DATE OF BIRTH:  May 08, 1919   PHYSICIAN:  Leonie Man, M.D.    VISIT DATE:  05/16/2009                                   OFFICE VISIT   This is an 77 year old lady with an ulcer overlying the anterior portion  of her left ankle, which at this point measures 1 x 0.5 x 0.1.  She is  here for followup of this ulcer, which has been slowly closing, but  approaching complete closure at this time.  Last wound care therapy was  with hydrogel and a leave-in pad.  She is not currently on any  antibiotics.   On examination, the surrounding skin is normal.  The wound edges are  clean and the wound base is okay.  She had some smaller degree of slough  on the base of the wound, which was debrided selectively.  This was less  than 20 sq. cm in size.   ASSESSMENT:  Continued improvement in this left ankle ulcer.   TREATMENT:  Again today was hydrogel and leave-in pad.   DISPOSITION:  Return to clinic in 3 weeks.      Leonie Man, M.D.  Electronically Signed     PB/MEDQ  D:  05/16/2009  T:  05/17/2009  Job:  [id number]

## 2011-05-15 NOTE — Assessment & Plan Note (Signed)
OFFICE VISIT   Kerry Myers, Kerry Myers  DOB:  1919-10-03                                       06/07/2008  UVOZD#:[id number]   REASON FOR VISIT:  Evaluate for venous insufficiency.   HISTORY:  This is an 77 year old female I am seeing at the request of  Dr. Lestine Box for evaluation of a possible venous insufficiency.  The  patient has complaints of left leg pain as well as swelling.  This has  been going on for quite some time.  The patient has even worn  compression in the past.  It sounds like approximately 1 month ago she  had severe pain which was attributed possibly to cellulitic presentation  of venous insufficiency.  The patient also had a possible squamous cell  carcinoma on the lateral proximal tibia.  Due to her exquisite  tenderness in the leg, she is sent for further evaluation.  Again, the  patient states that she has worn compression in the past and that her  swelling has been a longstanding problem.  It does go away with  elevation.  She has not had any ulceration.  There is no known history  of venous thromboses.   PAST MEDICAL HISTORY:  Significant for coronary artery disease, Morton's  neuroma, arthritis.   In general, she is well-appearing in no acute distress.  She is  normocephalic, atraumatic.  Respirations are nonlabored.  Extremities  reveal bilateral lower extremity edema from the ankle to the knee.  There is no ulceration and there is no cellulitis, just 3+ pitting  edema.   DIAGNOSTIC STUDIES:  The patient underwent duplex evaluation today.  This reveals minimal chronic DVT in her right superficial femoral vein  and left posterior tibial vein.  There is evidence of a chronic  superficial venous thrombosis in the right greater saphenous vein as  well as bilateral short saphenous veins.  There is no evidence of venous  insufficiency bilaterally.   ASSESSMENT/PLAN:  Rule out venous insufficiency.   PLAN:  Based on the patient's duplex,  she does not have evidence of  venous insufficiency.  I would attribute her swelling to either cardiac  or renal problems versus lymphedema.  I have recommended that she be  persistent in wearing her compression as well as keeping her legs  elevated.  I have given a prescription for 20-30 mm compression  stockings.  She says she does have some at home and will try these  first.  If she has trouble getting the stockings on, she can consider  bilateral Ace wraps.  Again, based on duplex findings she does not have  venous insufficiency.   Jorge Ny, MD  Electronically Signed   VWB/MEDQ  D:  06/07/2008  T:  06/08/2008  Job:  [id number]   cc:   Leonides Grills, M.D.

## 2011-05-15 NOTE — Assessment & Plan Note (Signed)
[facility]   NAME:  Kerry Myers, Kerry Myers                ACCOUNT NO.:  [id number]   MEDICAL RECORD NO.:  [id number]      DATE OF BIRTH:  1919-12-22   PHYSICIAN:  Leonie Man, M.D.    VISIT DATE:  04/25/2009                                   OFFICE VISIT   PROBLEM:  One superficial ulcer of the ankle, which began as a blister.  This has been over the past weeks selectively debrided and treated with  hydrogel and a protective pad.  The patient also complains of an area of  callus formation on the third toe of the right foot.  This is a simple  callus without any underlying wounds.  She says it hurts.  We will go  ahead put a padded toe sock over this wound, and we will treat her left  ankle ulcer with hydrogel and Allevyn pad.  Followup will be in 2 weeks.      Leonie Man, M.D.  Electronically Signed     PB/MEDQ  D:  04/25/2009  T:  04/26/2009  Job:  [id number]

## 2011-05-15 NOTE — Assessment & Plan Note (Signed)
[facility]   NAME:  Kerry Myers, Kerry Myers NO.:  [id number]   MEDICAL RECORD NO.:  [id number]      DATE OF BIRTH:  1919-02-08   PHYSICIAN:  Leonie Man, M.D.    VISIT DATE:  03/28/2009                                   OFFICE VISIT   PROBLEM:  [name] is an 77 year old lady who had a small blister on  her left anterior ankle some months ago and this wound eventually  ruptured into a somewhat linear ulcer on the anterior portion of the  ulcer.  This ulcer has remained essentially unchanged for the past  several months.  Initial cultures showed some Proteus. She was treated  with Levaquin at that time.   CURRENT Rx  The patient's current treatment has been with Santyl and moist gauze  dressings over that ulcer.  Additionally, she also has a reddened third  toe on the right foot which has a small callus at the base of the foot.  This area is primarily osteoarthritic and not addressed.   On examination and treatment today,  The patient is afebrile with temperature of 97.9, pulse is 64,  respirations 18, and her blood pressure is 137/74.  The ulcer at the left ankle is selectively debrided with a scalpel to  remove some fibrinous exudate at the base.  The wound does go deep to  approximately 0.2 cm.  This is then covered with a Santyl, damp-to-dry  dressing, and a Kerlix and followed by a Coban to continue controlling  her edema.   DISP/  We will see her again in approximately 2 weeks for a followup.       Leonie Man, M.D.  Electronically Signed     PB/MEDQ  D:  03/28/2009  T:  03/29/2009  Job:  [id number]

## 2011-05-15 NOTE — Assessment & Plan Note (Signed)
[facility]   NAME:  ASHAWNTI, TANGEN                ACCOUNT NO.:  [id number]   MEDICAL RECORD NO.:  [id number]      DATE OF BIRTH:  08/22/1919   PHYSICIAN:  Maxwell Caul, M.D. VISIT DATE:  03/14/2009                                   OFFICE VISIT   Ms. Skeet is a lady who had a small blister on her left anterior ankle  some months ago.  The wound eventually ruptured into an ulcer that has  been more or less unchanged for the last few months.  When she first  came here, the wound cultured Proteus.  She was treated with Levaquin.  There was also a large amount of edema which has been helped by the  Kerlix and Coban wraps.   On examination, her temperature is 98.2.  The wound on her left ankle is  not in need of debridement, although it is somewhat dry and pale  looking.  She also has a small wound on the right medial lower extremity  that we removed some surface eschar.  This is tiny but appears clean and  should close over.   Finally, she has several areas on her right foot that she finds painful.  These include the plantar aspect of her second and third toe, both of  which have thick callus and probably some degree of chronic friction-  related injury.  The medial aspect of her right second toe also probably  has a bony spur at that has constant friction with her first toe.  All  of these will need pressure offloading.  I did remove some callus from  the base of the right third toe.   IMPRESSION:  Stasis ulceration on the left ankle.  We applied Puracol  and hydrogel to this with Kerlix and Coban wrap.  This can be changed 3  times a week by [facility]).  To the toes on her right foot, I  would suggest all efforts at pressure relief including toe socks or toe  wraps and an insole for her shoes.  To the area on the right lateral  leg, I would use Polysporin and a dry dressing on this.           ______________________________  Maxwell Caul, M.D.     MGR/MEDQ  D:  03/14/2009  T:  03/15/2009  Job:  [id number]

## 2011-05-15 NOTE — Assessment & Plan Note (Signed)
[facility]   NAME:  Kerry Myers, Kerry Myers                ACCOUNT NO.:  [id number]   MEDICAL RECORD NO.:  [id number]      DATE OF BIRTH:  [birth year]   PHYSICIAN:  Leonie Man, M.D.    VISIT DATE:  08/29/2009                                   OFFICE VISIT   Kerry Myers is an 77 year old lady with a nonhealing ulcer of the dorsal  surface of the left ankle.  This wound measures 0.6 x 0.3 x 0.1 before  debridement and then post debridement is 1.3 x 0.9 x 0.3.  This wound  remains mystery as to why it will not heal in this patient.   On today's evaluation, her temperature is 98, pulse 68, respirations 20,  blood pressure 175/68.  She is here again today with her son, who does most of her wound  dressings.   I infiltrated the region of the wound with 1% lidocaine plain, and used  a curette to debride the base of the wound to see if we can give it a  fresh start.  I placed some prisma into the depths of the wound and  covered this with a sterile dressing.  When she returns, we will have to  reevaluate both her nutritional status and her vascular status to see if  there is some reason why this wound is not healing.      Leonie Man, M.D.  Electronically Signed     PB/MEDQ  D:  08/29/2009  T:  08/30/2009  Job:  [id number]

## 2011-05-15 NOTE — Assessment & Plan Note (Signed)
[facility]   NAME:  Kerry Myers, Kerry Myers                ACCOUNT NO.:  [id number]   MEDICAL RECORD NO.:  [id number]      DATE OF BIRTH:  November 04, 1919   PHYSICIAN:  Leonie Man, M.D.    VISIT DATE:  07/18/2009                                   OFFICE VISIT   PROBLEM:  Nonhealing wound over the dorsum of the left ankle.  The  patient has been having no significant pain.  She has been treated with  hydrogel, Promogran, and an Allevyn pad over this area.   PHYSICAL EXAMINATION:  VITAL SIGNS:  On evaluation today, she is  afebrile.  Temperature is 97.6, pulse 69, and blood pressure is 117/60.  The base of the wound is clean and there is approximately 20%  epithelialization.  The rest is well-granulated.   TREATMENT:  Continue hydrogel, Promogran with surrounding Bactroban and  cover with an Allevyn pad.  This should be changed every 3 days.  The  patient should return in 2 weeks.      Leonie Man, M.D.  Electronically Signed     PB/MEDQ  D:  07/18/2009  T:  07/19/2009  Job:  [id number]

## 2011-05-15 NOTE — Assessment & Plan Note (Signed)
[facility]   NAME:  Kerry Myers, Kerry Myers NO.:  [id number]   MEDICAL RECORD NO.:  [id number]      DATE OF BIRTH:  02/26/1919   PHYSICIAN:  Maxwell Caul, M.D.      VISIT DATE:                                   OFFICE VISIT   [name] is a lady we saw last week who had developed a small blister  on her left anterior ankle 6 months ago and also significant swelling of  her left leg.  The wound eventually ruptured into an ulcer that has been  more or less unchanged per her description for the last several months.  She had had two duplex ultrasounds that were negative for DVT.  Culture  last week we debrided the wound, there was some difficulty, put Santyl  in the wound and put her in a Kerlix and Coban wrap.  She returns today  in followup.   Wound culture that we did last week grew Proteus.  I therefore start her  on Levaquin today.   On examination, her temperature was 97.9.  The major wound on the left  anterior ankle measures 1.8 x 1 x 0.4.  Once again, this required a  nonexcisional debridement which she tolerated poorly in spite of  injectable lidocaine.  However, the debridement was not as aggressive as  last time and the base of the wound looks cleaner afterwards.  The area  on the medial right lower extremity we will continue simply to put  Polysporin and a dry dressing on.   The degree of edema in her left leg with a simple Coban compression was  much improved versus last time.  Therefore, I am less concerned about an  alternative explanation for her edema than simply venostasis.   IMPRESSION:  Stasis ulceration left ankle complicated by Proteus  cellulitis.  She we started on Levaquin.  We put the Puracol AG,  hydrogel, and a dry dressing and the wound wrapped with Kerlix and  Coban.  The area on the right medial lower extremity I continued to  simply put a Polysporin, dry dressing on this.  I did not see any reason  for  ongoing compression.  As mentioned the degree of edema in the left  leg is much improved.           ______________________________  Maxwell Caul, M.D.     MGR/MEDQ  D:  02/28/2009  T:  02/28/2009  Job:  [id number]

## 2011-05-15 NOTE — Assessment & Plan Note (Signed)
[facility]   NAME:  RHIANON, ZABAWA                ACCOUNT NO.:  [id number]   MEDICAL RECORD NO.:  [id number]      DATE OF BIRTH:  [birth year]   PHYSICIAN:  Leonie Man, M.D.    VISIT DATE:  07/05/2009                                   OFFICE VISIT   PROBLEM:  An 77 year old lady with ulcer overlying the anterior portion  of her left ankle with current measurements at 1 x 0.3 x 0.1.  It shows  some small decrease in size since her last visit.  She had been followed  with Promogran and hydrogel over an Allevyn pad.  The area of the ulcer  which is right at the ankle joint walking causes the ulcer to dehisce  each time she tries to heal it.  On this occasion, the wound is clean at  its base.  Surrounding skin is normal.  No odor or drainage.  We will  discontinue the Promogran and simply use hydrogel dressing on this and  schedule her for return visit in 2 weeks.      Leonie Man, M.D.  Electronically Signed     PB/MEDQ  D:  07/05/2009  T:  07/06/2009  Job:  [id number]

## 2011-05-18 NOTE — Discharge Summary (Signed)
Kerry Myers, Kerry Myers                ACCOUNT NO.:  [id number]   MEDICAL RECORD NO.:  [id number]          PATIENT TYPE:  INP   LOCATION:  9320                          FACILITY:  [facility]   PHYSICIAN:  Michelle L. Grewal, M.D.DATE OF BIRTH:  1919-05-16   DATE OF ADMISSION:  01/22/2005  DATE OF DISCHARGE:  01/25/2005                                 DISCHARGE SUMMARY   ADMISSION DIAGNOSIS:  Pelvic prolapse.   DISCHARGE DIAGNOSIS:  Pelvic prolapse.   HOSPITAL COURSE:  The patient is an 77 year old female with pelvic  relaxation, who is admitted and on the day of surgery underwent an A&P  repair with total vaginal hysterectomy and pubovaginal sling.  The patient  has had significant prolapse for some time and at the time of surgery it was  found that she still had her uterus despite history earlier on that she had  her womb removed at some point.  The patient had an uncomplicated  postoperative and intraoperative course.  Her EBL was 300 mL.  By  postoperative day #1, hemoglobin was 7.9, she was ambulating, walking, and  tolerating clears.  She had also had flatus.  By postoperative day #2, she  was doing well.  She was having some difficulty with voiding, but by  postoperative day #3 was voiding without difficulty, was ambulating  wonderfully, and had good vital signs.  She was discharged home on the  evening of postoperative day #3.  She was given Tylox to take as needed for  pain, ibuprofen to take as needed for pain.  She was advised to call if she  had any vaginal bleeding, temperature greater than 100.5, nausea, vomiting,  or abdominal pain.  She was advised to follow up in the office in one week  and advised no driving.      MLG/MEDQ  D:  03/06/2005  T:  03/07/2005  Job:  [id number]

## 2011-05-18 NOTE — Op Note (Signed)
Kerry Myers, Kerry Myers NO.:  [id number]   MEDICAL RECORD NO.:  [id number]          PATIENT TYPE:  INP   LOCATION:  9320                          FACILITY:  [facility]   PHYSICIAN:  Valetta Fuller, M.D.  DATE OF BIRTH:  1919-10-28   DATE OF PROCEDURE:  01/22/2005  DATE OF DISCHARGE:                                 OPERATIVE REPORT   PREOPERATIVE DIAGNOSES:  Urethral hypermobility and stress incontinence.   POSTOPERATIVE DIAGNOSES:  Urethral hypermobility and stress incontinence.   PROCEDURE:  Retropubic pubovaginal sling.   SURGEON:  Valetta Fuller, M.D.   ANESTHESIA:  General.   INDICATIONS FOR PROCEDURE:  [name] is an 77 year old patient of Dr.  Lynnell Dike. She has been sent to [place] for evaluation secondary to severe  cystocele, voiding dysfunction and mild incontinence. Because of the  patient's age, she was managed conservatively with a pessary but this became  unsatisfactory to the patient.  Her quality of life suffered greatly because  of the prolapse. She has been diagnosed with a grade 4 cystocele and has had  some mild stress incontinence.  Dr. Vincente Poli is operating on the patient today  and performing anterior and posterior repairs as well as a vaginal  hysterectomy.  We felt that given the situation it would be prudent to place  a loose concurrent pubovaginal sling for additional support as well as to  hopefully improve her stress urinary incontinence and certainly prevent that  from worsening with reduction of a large cystocele.  The advantages and  disadvantages of incontinence surgery was discussed with the patient and her  family.   TECHNIQUE:  When we entered the operating room, the patient was in high  lithotomy position and had already undergone reduction of her cystocele as  well as rectocele and hysterectomy.  The anterior vaginal mucosa was still  open around the mid urethra.  The patient was lowered to a mid lithotomy  position.  We were able  to easily palpate underneath the endopelvic fascia  bilaterally. Two small stab incisions were made just lateral to the midline  above the pubic symphysis and with direct digital finger control, the SPARC  needles were passed bilaterally. The Foley catheter was then removed and  cystoscopy performed. There was no evidence of any penetration of the  needles in the bladder and the positioning appeared to be good.  The [facility]  sling was then placed in a standard manner at the mid urethra with a right  angled clamp underneath the sling to assure proper tensioning.  The sheaths  of the sling were then removed and the whole area was copiously irrigated.  The positioning of the sling appeared to be good right at the mid urethra.  The redundant sling was cut off and the suprapubic wounds were closed with  Dermabond.  The vaginal mucosa will be closed by Dr. Vincente Poli.  A new Foley catheter was  placed at the time of the sling placement after cystoscopy had been  performed. Dr. Vincente Poli will dictated separately her portion of the procedure.  The patient appeared to  tolerate the procedure well and there were no  obvious complications.      DSG/MEDQ  D:  01/23/2005  T:  01/23/2005  Job:  [id number]   cc:   Marcelino Duster L. Vincente Poli, M.D.  [address]  [place] [address]  Fax: [phone]

## 2011-05-22 NOTE — Op Note (Signed)
   Kerry Myers, Kerry Myers                         ACCOUNT NO.:  [id number]   MEDICAL RECORD NO.:  [id number]                   PATIENT TYPE:  AMB   LOCATION:  ENDO                                 FACILITY:  [facility]   PHYSICIAN:  Georgiana Spinner, M.D.                 DATE OF BIRTH:  [birth year]   DATE OF PROCEDURE:  08/13/2002  DATE OF DISCHARGE:                                 OPERATIVE REPORT   PROCEDURE:  Colonoscopy.   INDICATIONS:  Colon cancer screening.   ANESTHESIA:  Demerol 10, Versed 1 mg.   PREPARATION:  Visicol, and the results were excellent.   DESCRIPTION OF PROCEDURE:  With patient mildly sedated in the left lateral  decubitus position, the Olympus videoscopic pediatric colonoscope was  inserted in the rectum and passed under direct vision into a very tortuous  colon to the cecum, identified by the ileocecal valve and appendiceal  orifice, both of which were photographed.  From this point, the colonoscope  was slowly withdrawn, taking circumferential views of the entire colonic  mucosa, stopping to photograph diverticulosis seen along the way which was  quite severe in the sigmoid colon until we reached the rectum which appeared  normal on direct and showed hemorrhoids on retroflexed view.  The endoscope  was straightened and withdrawn.  The patient's vital signs and pulse  oximeter remained stable.  The patient tolerated the procedure well without  apparent complications.   FINDINGS:  1. Internal hemorrhoids.  2. Significant diverticulosis of sigmoid colon with a very tortuous colon.   PLAN:  See endoscopy note for further details.                                               Georgiana Spinner, M.D.    GMO/MEDQ  D:  08/13/2002  T:  08/14/2002  Job:  [id number]   cc:   Lacretia Leigh. Quintella Reichert, M.D.

## 2011-05-22 NOTE — Op Note (Signed)
   TNAMEADREAM, PARZYCH                         ACCOUNT NO.:  [id number]   MEDICAL RECORD NO.:  [id number]                   PATIENT TYPE:  AMB   LOCATION:  ENDO                                 FACILITY:  [facility]   PHYSICIAN:  Georgiana Spinner, M.D.                 DATE OF BIRTH:  1919-07-13   DATE OF PROCEDURE:  08/13/2002  DATE OF DISCHARGE:                                 OPERATIVE REPORT   PROCEDURE:  Upper endoscopy.   INDICATIONS:  Abdominal fullness.   ANESTHESIA:  Demerol 60, Versed 7 mg.   DESCRIPTION OF PROCEDURE:  With patient mildly sedated in the left lateral  decubitus position, the Olympus videoscopic endoscope was inserted in the  mouth and passed under direct vision through the esophagus, which appeared  normal.  We entered into the stomach through a hiatal hernia.  Fundus, body  appeared quite erythematous, consistent with a rather diffuse gastritis.  The antrum was also erythematous but not as intense.  Duodenal bulb and  second portion of duodenum appeared normal.  From this point, the endoscope  was slowly withdrawn, taking circumferential views of the entire duodenal  mucosa until the endoscope then pulled into the stomach, placed in  retroflexion to view stomach from below, and the hiatal hernia was once  again seen.  The endoscope was straightened and withdrawn, taking  circumferential views of the remaining gastric and esophageal mucosa,  stopping to biopsy along the way the intense erythema.  The patient's vital  signs and pulse oximeter remained stable.  The patient tolerated the  procedure well without apparent complications.   FINDINGS:  1. Diffuse erythema of stomach.  2. Hiatal hernia.  3. Otherwise unremarkable exam.   PLAN:  1. Await biopsy report.  2. Continue proton pump inhibitor at this and have patient follow up with me     as an outpatient.                                               Georgiana Spinner, M.D.    GMO/MEDQ  D:  08/13/2002   T:  08/14/2002  Job:  [id number]   cc:   Lacretia Leigh. Quintella Reichert, M.D.

## 2011-05-22 NOTE — Op Note (Signed)
NAMEADALIZ, DOBIS                          ACCOUNT NO.:  [id number]   MEDICAL RECORD NO.:  [id number]                   PATIENT TYPE:  AMB   LOCATION:  DAY                                  FACILITY:  [facility]   PHYSICIAN:  Abigail Miyamoto, M.D.              DATE OF BIRTH:  14-Aug-1919   DATE OF PROCEDURE:  05/12/2004  DATE OF DISCHARGE:                                 OPERATIVE REPORT   PREOPERATIVE DIAGNOSES:  Bilateral inguinal hernias.   POSTOPERATIVE DIAGNOSES:  Bilateral inguinal hernias.   PROCEDURE:  Bilateral laparoscopic inguinal hernia repair with mesh.   SURGEON:  Abigail Miyamoto, M.D.   ANESTHESIA:  General endotracheal anesthesia and 0.25% Marcaine plain.   ESTIMATED BLOOD LOSS:  Minimal.   DESCRIPTION OF PROCEDURE:  The patient was brought to the operating room,  identified as Kerry Myers. She was placed supine on the operating room  table and general anesthesia was induced. Her abdomen was then prepped and  draped in the usual sterile fashion. Using a #15 blade, a small transverse  incision was made below the umbilicus. The incision was carried down to the  fascia which was then identified and opened. The patient was so thin,  however, this opened up the abdomen into the peritoneal cavity. A #0 Vicryl  figure-of-eight suture was used to close this. A separate skin incision was  then created and the fascia was identified at this time  and her very  thinned out rectus muscle will be identified and elevated. The dissecting  balloon was then carefully passed underneath the rectus sheath and  manipulated toward the pubis The dissecting balloon was then insufflated  under direct vision dissecting out the preperitoneal space.  Again her skin  and muscles overlying this were extremely thin as well as the posterior  fascia. The dissecting balloon was insufflated dissecting down to the  preperitoneal space. The balloon was then removed.  A smaller balloon port  was  placed through the incision just below the umbilicus and insufflation of  the preperitoneal space was begun.  Two 5 mm ports were then placed in the  patient's midline under direct vision.  Cooper's ligament was easily  identified. Dissection was then carried out in the left inguinal area first.  The patient had a small fascial defect in the abdominal wall that appeared  to be a direct hernia defect. The round ligament was easily identified and a  small hernia sac going to this defect was easily reduced. Cooper's ligament  in the abdominal wall and lateral attachments are all identified easily.  Dissection was then carried out on the right side of the groin as well.  Again the patient had a similar hernia defect along the round ligament of  the uterus and the sac again was easily reduced.  Next, two pieces of precut  Prolene mesh for both left and right side were brought onto the field.  The  right sided mesh was placed into the preperitoneal space through the  umbilical port and then tacked to Cooper's ligament up the medial abdominal  wall and out laterally. This covered the defect and inguinal area very well.  Likewise the left piece of mesh was then placed through Cooper's ligament up  the medial abdominal wall and out laterally as well.  The skin again and  overlying tissue was extremely thin, care was taken not to tether the skin  or penetrate the skin with the surgical tacks.  Once both pieces of mesh  were firmly tacked in place, the groin was examined and again hemostasis  appeared to be achieved. The preperitoneal space was then deflated. The mesh  appeared to lay appropriately as the space collapsed. All ports were then  completely removed. The fascia at the umbilical port was then closed with a  figure-of-eight #0 Vicryl suture. All skin incisions were anesthetized with  0.25% Marcaine. The ilioinguinal nerve blocks were likewise performed with  the Marcaine.  All incisions were  then  closed with subcuticular 4-0 Monocryl sutures. Steri-Strips, gauze and tape  were then applied. The patient tolerated the procedure well. All sponge,  needle and instrument counts were correct at the end of the procedure. The  patient was then extubated in the operating room and taken in stable  condition to the recovery room.                                               Abigail Miyamoto, M.D.    DB/MEDQ  D:  05/12/2004  T:  05/12/2004  Job:  [id number]

## 2011-05-22 NOTE — Op Note (Signed)
Kerry Myers, Kerry Myers                ACCOUNT NO.:  [id number]   MEDICAL RECORD NO.:  [id number]          PATIENT TYPE:  INP   LOCATION:  9320                          FACILITY:  [facility]   PHYSICIAN:  Michelle L. Grewal, M.D.DATE OF BIRTH:  07-19-1919   DATE OF PROCEDURE:  01/22/2005  DATE OF DISCHARGE:                                 OPERATIVE REPORT   PREOPERATIVE DIAGNOSIS:  Pelvic relaxation.   POSTOPERATIVE DIAGNOSIS:  Total procidentia.   PROCEDURE:  Total vaginal hysterectomy, anterior repair, posterior repair.   SURGEON:  Michelle L. Vincente Poli, M.D.   ASSISTANT:  Raynald Kemp, M.D.   ANESTHESIA:  General.   ESTIMATED BLOOD LOSS:  Approximately 300 cc.   PROCEDURE:  The patient was taken to the operating room after informed  consent was obtained.  She was prepped and draped in the usual sterile  fashion after she was intubated.  When she was placed in lithotomy, it was  noted she had total procidentia.  Of note, the patient was a limited  historian and had originally told me she had had a hysterectomy, but I  visualized something that appeared to be cervix prolapsing completely though  the vagina, and predominantly large water balloon size bladder protruding  from the vagina as well.  She was prepped and draped.  We initially started  with reduction of the large cystocele by making a small transverse incision  over what we thought was the cervix and making a midline incision almost all  the way up to the UV angle in the anterior vaginal vault.  Reducing the  cystocele required quite a bit of sharp and blunt dissection.  After it was  nicely reduced, we then reinforced the cystocele and closed it with  pursestring sutures using 0 Vicryl suture.  The redundant vaginal epithelium  was trimmed.  At this point, I left the anterior vagina open so that I could  work on what I thought was the cervix by making a small incision behind this  area of the cervix, opening up posteriorly and  entering the cul-de-sac  posteriorly, and then identifying the uterus and ovaries and tubes.  We then  created a window through the peritoneum anteriorly, and then I placed some  curved Heaney clamps across the uterus on either side.  The uterus was very  small.  The ovaries were noted to be very atrophic.  The specimen was  removed, and the pedicles were secured both a suture ligature of 0 Vicryl  suture and free ties of 0 Vicryl suture.  Hemostasis was noted.  There was  also noted to be a very large enterocele, and so the bowel was placed back  up into the abdominal cavity.  We then placed some reinforcing stitches in  the cul-de-sac using 0 Vicryl suture.  We then closed the enterocele using a  pursestring suture using 0 Vicryl suture.  At this point, I just made a V-  shaped incision on the perineum and worked my way up all the way up to the  vaginal apex and opened the vagina up posteriorly,  reduced the rectocele  nicely, and reinforced with stitches using 0 Vicryl suture interrupted.  We  then trimmed off the redundant tissue, and then I closed the anterior vagina  except for a small 2 cm window which was just beneath the urethra for the  sling placement, and the remainder of this was closed using a running stitch  using 2-0 Vicryl suture.  At the end of the procedure, both the cystocele  and rectocele were nicely reduced.  She had a short vagina, and I had  considered a sacrospinous ligament fixation, but her vagina was too short  for that to suspend it to the sacrospinous ligament.  At this point, Dr.  Isabel Caprice scrubbed in and performed his pubovaginal sling and cystoscopy, and I  assisted him with that.  After he placed the sling, hemostasis was noted in  the anterior just beneath the bladder.  We then closed that portion  of the incision using 0 Vicryl suture.  A vaginal packing was placed in the  vagina.  The patient tolerated the procedure extremely well.  All sponge,  lap, and  instrument counts were correct x 2, and she went to the recovery  room in stable condition.      MLG/MEDQ  D:  01/23/2005  T:  01/23/2005  Job:  [id number]

## 2011-06-08 ENCOUNTER — Telehealth: Payer: Self-pay | Admitting: *Deleted

## 2011-06-08 MED ORDER — PRAMIPEXOLE DIHYDROCHLORIDE 0.25 MG PO TABS: 0.2500 mg | ORAL_TABLET | Freq: Every day | ORAL | Status: DC

## 2011-06-08 NOTE — Telephone Encounter (Signed)
Spoke with [name] and I asked how often was the patient taking her meds, and he stated she was taking it  Every 6 hours and not taking it everyday, I advised she will not need an increase of her meds and we will not fill at this time, he stated he will count her meds. I advised if she has increased pain per Dr.[name]'s previous note she will need to come in for an OV, I advised I would send the 90 day supply of [facility] to [facility] and a 30 day supply to [facility] since she only has 3 pills left and he agreed. [name] admitted that he also took Tramadol and know she shouldn't take it everyday, I advised he doesn't need to give it to her if she does not need it.  He agreed.    KP

## 2011-06-08 NOTE — Telephone Encounter (Signed)
Pt son states that #180 is not lasting Pt 3 month. Pt son is requesting a increase in TRAMADOL HCL 50      (1 by mouth every 4-6 hours as needed).Please advise

## 2011-06-08 NOTE — Telephone Encounter (Signed)
How often is she taking it----it is an as needed med--she should not be taken every 4 hours

## 2011-06-28 ENCOUNTER — Other Ambulatory Visit: Payer: Self-pay | Admitting: *Deleted

## 2011-06-28 MED ORDER — TRAMADOL HCL 50 MG PO TABS: 50.0000 mg | ORAL_TABLET | Freq: Four times a day (QID) | ORAL | Status: DC | PRN

## 2011-06-28 NOTE — Telephone Encounter (Signed)
Last filled 03-12-11 #180, last OV 02-19-11

## 2011-06-28 NOTE — Telephone Encounter (Signed)
Refill x1 

## 2011-06-28 NOTE — Telephone Encounter (Signed)
Faxed.   KP 

## 2011-08-24 DIAGNOSIS — Z0279 Encounter for issue of other medical certificate: Secondary | ICD-10-CM

## 2011-09-14 ENCOUNTER — Other Ambulatory Visit: Payer: Self-pay

## 2011-09-14 MED ORDER — SOLIFENACIN SUCCINATE 10 MG PO TABS: ORAL_TABLET | ORAL | Status: DC

## 2011-09-14 NOTE — Telephone Encounter (Signed)
Rx faxed.    KP 

## 2011-10-05 ENCOUNTER — Ambulatory Visit (INDEPENDENT_AMBULATORY_CARE_PROVIDER_SITE_OTHER): Payer: Medicare Other | Admitting: Family Medicine

## 2011-10-05 ENCOUNTER — Encounter: Payer: Self-pay | Admitting: Family Medicine

## 2011-10-05 VITALS — BP 120/56 | HR 72 | Temp 98.4°F | Wt 81.0 lb

## 2011-10-05 DIAGNOSIS — R5381 Other malaise: Secondary | ICD-10-CM

## 2011-10-05 DIAGNOSIS — R5383 Other fatigue: Secondary | ICD-10-CM

## 2011-10-05 DIAGNOSIS — D649 Anemia, unspecified: Secondary | ICD-10-CM | POA: Insufficient documentation

## 2011-10-05 DIAGNOSIS — Z23 Encounter for immunization: Secondary | ICD-10-CM

## 2011-10-05 DIAGNOSIS — G2581 Restless legs syndrome: Secondary | ICD-10-CM

## 2011-10-05 DIAGNOSIS — Z0000 Encounter for general adult medical examination without abnormal findings: Secondary | ICD-10-CM

## 2011-10-05 LAB — CBC WITH DIFFERENTIAL/PLATELET
Basophils Relative: 0.5 % (ref 0.0–3.0)
Eosinophils Relative: 0.7 % (ref 0.0–5.0)
HCT: 39 % (ref 36.0–46.0)
Lymphs Abs: 1.5 10*3/uL (ref 0.7–4.0)
MCV: 96.2 fl (ref 78.0–100.0)
Monocytes Absolute: 0.4 10*3/uL (ref 0.1–1.0)
Platelets: 206 10*3/uL (ref 150.0–400.0)
WBC: 4.8 10*3/uL (ref 4.5–10.5)

## 2011-10-05 LAB — BASIC METABOLIC PANEL
BUN: 27 mg/dL — ABNORMAL HIGH (ref 6–23)
CO2: 32 mEq/L (ref 19–32)
Chloride: 103 mEq/L (ref 96–112)
Potassium: 3.7 mEq/L (ref 3.5–5.1)

## 2011-10-05 LAB — TSH: TSH: 0.95 u[IU]/mL (ref 0.35–5.50)

## 2011-10-05 LAB — FERRITIN: Ferritin: 96.7 ng/mL (ref 10.0–291.0)

## 2011-10-05 MED ORDER — PRAMIPEXOLE DIHYDROCHLORIDE 0.5 MG PO TABS: 0.5000 mg | ORAL_TABLET | Freq: Every day | ORAL | Status: DC

## 2011-10-05 MED ORDER — ZOSTER VACCINE LIVE 19400 UNT/0.65ML ~~LOC~~ SOLR: 0.6500 mL | Freq: Once | SUBCUTANEOUS | Status: DC

## 2011-10-05 NOTE — Patient Instructions (Signed)
Restless Legs Syndrome Restless legs syndrome is a sensori-motor (movement) disorder. The symptoms include uncomfortable sensations in the legs. These leg sensations are worse during periods of inactivity or rest. They are also worse while sitting or lying down. There is often a positive family history of the disorder. Individuals that have the disorder describe sensations of:  Pulling.  Drawing.   Crawling.   Worming.  Boring.   Tingling.   Pins and Needles.  Prickly.   Painful.   The sensations are usually accompanied by an overwhelming urge to move the legs. Sudden muscle jerks may also occur. Movement provides temporary relief from the discomfort. In rare cases, the arms may also be affected. Symptoms may interfere with sleep onset (sleep onset insomnia). Research suggests that restless legs syndrome is related to periodic limb movement disorder (PLMD). PLMD is another more common motor disorder. It also causes interrupted sleep. The symptoms often exhibit circadian rhythmicity in their peak occurrence during awakening hours. TREATMENT Treatment for restless legs syndrome is symptomatic. This means that the symptoms are treated. Massage and application of cold compresses may provide temporary relief. Medications effective in relieving the symptoms include:  Temazepam.  Levodopa/Carbidopa.   Bromocriptine.  Pergolide Mesylate.   Oxycodone.  Propoxyphene.   Codeine.  However, many of these medications have side effects. Current research suggests correction of iron deficiency may improve symptoms for some patients. PROGNOSIS Restless legs syndrome is a life-long condition. There is no cure. Symptoms may gradually worsen with age. The most disabling feature is the sleep onset insomnia. It can be severe. Document Released: 12/07/2002 Document Re-Released: 06/06/2010 [employer] Patient Information 2011 [employer]. 

## 2011-10-05 NOTE — Progress Notes (Signed)
  Subjective:    Patient ID: Kerry Myers, female    DOB: 01-26-1919, 77 y.o.   MRN: [id number]  HPI Pt here c/o RLS worsening and fatigue during day.  No other complaints.  Son and other female caretaker are with her.  Review of Systems As above    Objective:   Physical Exam  Constitutional: Vital signs are normal. She appears cachectic. She is cooperative.  Pulmonary/Chest: Effort normal and breath sounds normal.  Psychiatric: Her speech is normal. Her mood appears not anxious. Her affect is blunt. She is withdrawn. She does not exhibit a depressed mood.          Assessment & Plan:

## 2011-10-05 NOTE — Assessment & Plan Note (Signed)
Increase mirapex to 0.5 mg qhs   Check labs

## 2011-10-05 NOTE — Assessment & Plan Note (Signed)
Check labs today.

## 2011-11-09 ENCOUNTER — Telehealth: Payer: Self-pay | Admitting: Family Medicine

## 2011-11-09 MED ORDER — OMEPRAZOLE 40 MG PO CPDR: 40.0000 mg | DELAYED_RELEASE_CAPSULE | Freq: Every day | ORAL | Status: DC

## 2011-11-09 NOTE — Telephone Encounter (Signed)
patient needs refill omeprazole 40 mg - 90 day supply - [facility] fax # [phone] ----[facility]

## 2011-12-14 ENCOUNTER — Other Ambulatory Visit: Payer: Self-pay | Admitting: Family Medicine

## 2011-12-14 MED ORDER — SOLIFENACIN SUCCINATE 10 MG PO TABS: ORAL_TABLET | ORAL | Status: DC

## 2011-12-14 NOTE — Telephone Encounter (Signed)
Rx faxed.    KP 

## 2012-01-21 ENCOUNTER — Other Ambulatory Visit: Payer: Self-pay | Admitting: Family Medicine

## 2012-01-21 DIAGNOSIS — G2581 Restless legs syndrome: Secondary | ICD-10-CM

## 2012-01-21 MED ORDER — PRAMIPEXOLE DIHYDROCHLORIDE 0.5 MG PO TABS: 0.5000 mg | ORAL_TABLET | Freq: Every day | ORAL | Status: DC

## 2012-01-21 NOTE — Telephone Encounter (Signed)
Faxed.   KP 

## 2012-02-04 ENCOUNTER — Other Ambulatory Visit: Payer: Self-pay | Admitting: Family Medicine

## 2012-02-04 DIAGNOSIS — G2581 Restless legs syndrome: Secondary | ICD-10-CM

## 2012-02-04 MED ORDER — PRAMIPEXOLE DIHYDROCHLORIDE 0.5 MG PO TABS: 0.5000 mg | ORAL_TABLET | Freq: Every day | ORAL | Status: DC

## 2012-02-04 NOTE — Telephone Encounter (Signed)
Rx faxed.    KP 

## 2012-03-04 ENCOUNTER — Encounter (HOSPITAL_BASED_OUTPATIENT_CLINIC_OR_DEPARTMENT_OTHER): Payer: Medicare Other | Attending: General Surgery

## 2012-03-04 ENCOUNTER — Other Ambulatory Visit (HOSPITAL_BASED_OUTPATIENT_CLINIC_OR_DEPARTMENT_OTHER): Payer: Self-pay | Admitting: General Surgery

## 2012-03-04 ENCOUNTER — Ambulatory Visit (HOSPITAL_COMMUNITY)
Admission: RE | Admit: 2012-03-04 | Discharge: 2012-03-04 | Disposition: A | Discharge status: Home / Self Care | Payer: Medicare Other | Source: Ambulatory Visit | Attending: General Surgery | Admitting: General Surgery

## 2012-03-04 DIAGNOSIS — L97509 Non-pressure chronic ulcer of other part of unspecified foot with unspecified severity: Secondary | ICD-10-CM | POA: Insufficient documentation

## 2012-03-04 DIAGNOSIS — M869 Osteomyelitis, unspecified: Secondary | ICD-10-CM

## 2012-03-04 DIAGNOSIS — Z79899 Other long term (current) drug therapy: Secondary | ICD-10-CM | POA: Insufficient documentation

## 2012-03-04 DIAGNOSIS — M899 Disorder of bone, unspecified: Secondary | ICD-10-CM | POA: Insufficient documentation

## 2012-03-04 NOTE — Progress Notes (Signed)
[facility]  NAME:  Kerry Myers, Kerry Myers                ACCOUNT NO.:  [id number]  MEDICAL RECORD NO.:  [id number]      DATE OF BIRTH:  12/27/1919  PHYSICIAN:  Joanne Gavel, M.D.         VISIT DATE:  03/04/2012                                  OFFICE VISIT   CHIEF COMPLAINT:  Wound, right second toe.  HISTORY OF PRESENT ILLNESS:  This is a 78 year old female with a long history of idiopathic neuropathy who developed a wound on the distal phalangeal joint of the right second toe several weeks ago.  This has been extremely painful.  She has received treatment with probable antibiotic ointment.  PAST MEDICAL HISTORY:  Significant that she had developed wounds of both ankles treated approximately 2 years ago at this clinic.  She has no history of diabetes.  No history of heart disease, lung disease, or stroke.  Cigarettes none.  Alcohol, none.  MEDICATIONS:  Gabapentin, VESIcare, ReQuip, hydrocodone when needed, and vitamins.  No known allergies.  PAST SURGICAL HISTORY:  Treated for rectal prolapse in 2011, hysterectomy and bladder repair in 2006, tonsillectomy as a child, and cataract surgery in 2005.  REVIEW OF SYSTEMS:  Essentially as above.  PHYSICAL EXAMINATION:  VITAL SIGNS:  Temperature 97.5, pulse 86, respirations 16, blood pressure 165/65. GENERAL APPEARANCE:  Well-developed, rather frail appearing, slender elderly female who is oriented as to time and place. CHEST AND HEART:  Negative. ABDOMEN:  Not examined. EXTREMITIES:  Examination of the feet reveals good peripheral pulses, particularly dorsalis pedis.  On the right second toe, there is a 0.6 x 0.6 lesion anterolaterally.  This easily probes to bone and possibly the distal phalangeal joint.  The toe is red and swollen as is the rest of the foot, but this is relatively slight.  There is an incipient wound on the right third toe.  There are no wounds on the left foot visible.  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  PLAN OF TREATMENT:  Vascular Studies, referred Orthopedics, start oral antibiotics.  I do not believe the patient is a candidate for hyperbaric oxygen under any circumstances.     Joanne Gavel, M.D.     RA/MEDQ  D:  03/04/2012  T:  03/04/2012  Job:  [id number]

## 2012-03-05 ENCOUNTER — Telehealth: Payer: Self-pay | Admitting: *Deleted

## 2012-03-05 NOTE — Telephone Encounter (Signed)
Kerry Myers from [facility] advised that they are out to start the pt on assistance from them today and that she was scheduled with an apt for orthopedic today,but note that starting care today her apt has been switched til tomorrow and they wanted to Lorain Childes MD Laury Axon, verbal information given to MD [facility] concerning pt

## 2012-03-12 ENCOUNTER — Ambulatory Visit (INDEPENDENT_AMBULATORY_CARE_PROVIDER_SITE_OTHER): Payer: Medicare Other | Admitting: *Deleted

## 2012-03-12 DIAGNOSIS — L97509 Non-pressure chronic ulcer of other part of unspecified foot with unspecified severity: Secondary | ICD-10-CM

## 2012-03-19 ENCOUNTER — Ambulatory Visit (INDEPENDENT_AMBULATORY_CARE_PROVIDER_SITE_OTHER): Payer: Medicare Other | Admitting: Family Medicine

## 2012-03-19 ENCOUNTER — Encounter: Payer: Self-pay | Admitting: Family Medicine

## 2012-03-19 VITALS — BP 102/56 | HR 54 | Temp 97.5°F

## 2012-03-19 DIAGNOSIS — S91109A Unspecified open wound of unspecified toe(s) without damage to nail, initial encounter: Secondary | ICD-10-CM

## 2012-03-19 DIAGNOSIS — M8618 Other acute osteomyelitis, other site: Secondary | ICD-10-CM

## 2012-03-19 DIAGNOSIS — R0602 Shortness of breath: Secondary | ICD-10-CM

## 2012-03-19 DIAGNOSIS — L97509 Non-pressure chronic ulcer of other part of unspecified foot with unspecified severity: Secondary | ICD-10-CM

## 2012-03-19 DIAGNOSIS — R32 Unspecified urinary incontinence: Secondary | ICD-10-CM

## 2012-03-19 DIAGNOSIS — G589 Mononeuropathy, unspecified: Secondary | ICD-10-CM

## 2012-03-19 MED ORDER — SOLIFENACIN SUCCINATE 10 MG PO TABS: ORAL_TABLET | ORAL | Status: DC

## 2012-03-19 NOTE — Progress Notes (Signed)
  Subjective:    Patient ID: Kerry Myers, female    DOB: 08/22/1919, 78 y.o.   MRN: [id number]  HPI Pt here for face to face for home wound care for wound on second toe.   She is going to wound clinic and home health is coming out to house.    Review of Systems As above    Objective:   Physical Exam  Constitutional: She is oriented to person, place, and time. She appears well-developed and well-nourished.  Cardiovascular: Normal rate.   No murmur heard. Pulmonary/Chest: Effort normal and breath sounds normal.  Musculoskeletal:       Wound on toe bandaged and covered---dressing changed today at home  Neurological: She is alert and oriented to person, place, and time.  Psychiatric: She has a normal mood and affect. Her behavior is normal. Judgment and thought content normal.          Assessment & Plan:  Wound -R toe---  Per wound center and home health

## 2012-03-19 NOTE — Patient Instructions (Signed)
Wound Care Wound care helps prevent pain and infection.  You may need a tetanus shot if:  You cannot remember when you had your last tetanus shot.   You have never had a tetanus shot.   The injury broke your skin.  If you need a tetanus shot and you choose not to have one, you may get tetanus. Sickness from tetanus can be serious. HOME CARE   Only take medicine as told by your doctor.   Clean the wound daily with mild soap and water.   Change any bandages (dressings) as told by your doctor.   Put medicated cream and a bandage on the wound as told by your doctor.   Change the bandage if it gets wet, dirty, or starts to smell.   Take showers. Do not take baths, swim, or do anything that puts your wound under water.   Rest and raise (elevate) the wound until the pain and puffiness (swelling) are better.   Keep all doctor visits as told.  GET HELP RIGHT AWAY IF:   Yellowish-white fluid (pus) comes from the wound.   Medicine does not lessen your pain.   There is a red streak going away from the wound.   You cannot move your finger or toe.   You have a fever.  MAKE SURE YOU:   Understand these instructions.   Will watch your condition.   Will get help right away if you are not doing well or get worse.  Document Released: 09/25/2008 Document Revised: 12/06/2011 Document Reviewed: 04/22/2011 Exit[employer] Patient Information 2012 [employer]. 

## 2012-03-27 ENCOUNTER — Other Ambulatory Visit: Payer: Self-pay | Admitting: Family Medicine

## 2012-03-27 MED ORDER — OMEPRAZOLE 40 MG PO CPDR: 40.0000 mg | DELAYED_RELEASE_CAPSULE | Freq: Every day | ORAL | Status: DC

## 2012-03-27 MED ORDER — TRAMADOL HCL 50 MG PO TABS: 50.0000 mg | ORAL_TABLET | Freq: Four times a day (QID) | ORAL | Status: DC | PRN

## 2012-03-27 NOTE — Telephone Encounter (Signed)
Refill both x1 

## 2012-03-27 NOTE — Telephone Encounter (Signed)
Refill for Tramadol HCL Tab 50MG  Qty 180  Last filled 6.28.12 Take 1 tablet by mouth every 6-hours as needed for pain &   Omeprazole Cap  Last written as 40MG  qty 90 date 11.12.12, directions Take 1 capsule (40 mg total) by mouth daily.  &   Metanx tab - Chart Shows: l-methylfolate-B6-B12 (METANX) 3-35-2 MG TABS 1-tablet ordering date 3.20.13

## 2012-03-27 NOTE — Telephone Encounter (Signed)
Last seen 03/19/12 ---Please advise       KP 

## 2012-04-01 ENCOUNTER — Encounter (HOSPITAL_BASED_OUTPATIENT_CLINIC_OR_DEPARTMENT_OTHER): Payer: Medicare Other | Attending: General Surgery

## 2012-04-01 ENCOUNTER — Other Ambulatory Visit: Payer: Self-pay | Admitting: Family Medicine

## 2012-04-01 DIAGNOSIS — L97509 Non-pressure chronic ulcer of other part of unspecified foot with unspecified severity: Secondary | ICD-10-CM | POA: Insufficient documentation

## 2012-04-01 DIAGNOSIS — Z79899 Other long term (current) drug therapy: Secondary | ICD-10-CM | POA: Insufficient documentation

## 2012-04-01 MED ORDER — L-METHYLFOLATE-B6-B12 3-35-2 MG PO TABS: 1.0000 | ORAL_TABLET | Freq: Every day | ORAL | Status: DC

## 2012-04-01 NOTE — Telephone Encounter (Signed)
Rx never filled here. Please advise      KP

## 2012-04-01 NOTE — Telephone Encounter (Signed)
Refill for  METANAX TAB   Last written 3.20.13 Qty 1 Direction Take 1 tablet by mouth daily

## 2012-04-08 ENCOUNTER — Other Ambulatory Visit (HOSPITAL_BASED_OUTPATIENT_CLINIC_OR_DEPARTMENT_OTHER): Payer: Self-pay | Admitting: General Surgery

## 2012-04-08 DIAGNOSIS — M869 Osteomyelitis, unspecified: Secondary | ICD-10-CM

## 2012-04-13 ENCOUNTER — Other Ambulatory Visit (HOSPITAL_COMMUNITY): Payer: Medicare Other

## 2012-04-14 ENCOUNTER — Ambulatory Visit (HOSPITAL_COMMUNITY)
Admission: RE | Admit: 2012-04-14 | Discharge: 2012-04-14 | Disposition: A | Discharge status: Home / Self Care | Payer: Medicare Other | Source: Ambulatory Visit | Attending: General Surgery | Admitting: General Surgery

## 2012-04-14 DIAGNOSIS — L02619 Cutaneous abscess of unspecified foot: Secondary | ICD-10-CM | POA: Insufficient documentation

## 2012-04-14 DIAGNOSIS — L03039 Cellulitis of unspecified toe: Secondary | ICD-10-CM | POA: Insufficient documentation

## 2012-04-14 DIAGNOSIS — M869 Osteomyelitis, unspecified: Secondary | ICD-10-CM | POA: Insufficient documentation

## 2012-04-14 LAB — CREATININE, SERUM
Creatinine, Ser: 0.61 mg/dL (ref 0.50–1.10)
GFR calc Af Amer: 90 mL/min — ABNORMAL LOW (ref >90)

## 2012-04-14 MED ORDER — GADOBENATE DIMEGLUMINE 529 MG/ML IV SOLN
10.0000 mL | Freq: Once | INTRAVENOUS | Status: AC | PRN
  Administered 2012-04-14: 7 mL via INTRAVENOUS

## 2012-04-16 DIAGNOSIS — L97509 Non-pressure chronic ulcer of other part of unspecified foot with unspecified severity: Secondary | ICD-10-CM

## 2012-04-16 DIAGNOSIS — M8618 Other acute osteomyelitis, other site: Secondary | ICD-10-CM

## 2012-04-16 DIAGNOSIS — G589 Mononeuropathy, unspecified: Secondary | ICD-10-CM

## 2012-04-16 DIAGNOSIS — R0602 Shortness of breath: Secondary | ICD-10-CM

## 2012-04-21 ENCOUNTER — Telehealth: Payer: Self-pay | Admitting: Family Medicine

## 2012-04-21 ENCOUNTER — Other Ambulatory Visit: Payer: Self-pay | Admitting: Family Medicine

## 2012-04-21 DIAGNOSIS — Z01818 Encounter for other preprocedural examination: Secondary | ICD-10-CM

## 2012-04-21 NOTE — Telephone Encounter (Signed)
Patient's surgery, rt II toe amputation, is currently scheduled for 04-30-12.  Just received referral today, that patient needs sx clearance for this by 04-30-12.  Per my call to [facility], office manager of [facility], the absolute soonest she can get patient in is, 05-06-12, with Dr. Excell Seltzer.  Patient will need to reschedule her surgery.

## 2012-04-21 NOTE — Telephone Encounter (Signed)
Is there possibly another cardiology office that we could set her up w/ so that she won't have to delay surgery?

## 2012-04-22 NOTE — Telephone Encounter (Signed)
Patient appointment is with [facility] on Friday, 04-25-12.  I will inform patient.

## 2012-04-22 NOTE — Progress Notes (Signed)
[facility]  NAME:  Kerry Myers, Kerry Myers                ACCOUNT NO.:  [id number]  MEDICAL RECORD NO.:  [id number]      DATE OF BIRTH:  1919-11-20  PHYSICIAN:  Joanne Gavel, M.D.              VISIT DATE:                                  OFFICE VISIT   ADMISSION DIAGNOSIS:  Wound on right 2nd toe secondary to neuropathy.  DISCHARGE DIAGNOSIS:  Toe wound with osteomyelitis.  COURSE OF TREATMENT:  The patient has neuropathic ulcers of the right 2nd toe.  This probed to bone immediately.  At first, we were not certain what the problem was, but then an MRI did reveal osteomyelitis. The patient was sent to Orthopedics and she had scheduled surgery for a week from today.     Joanne Gavel, M.D.     RA/MEDQ  D:  04/22/2012  T:  04/22/2012  Job:  [id number]

## 2012-04-28 ENCOUNTER — Encounter (HOSPITAL_BASED_OUTPATIENT_CLINIC_OR_DEPARTMENT_OTHER): Payer: Self-pay | Admitting: *Deleted

## 2012-04-28 NOTE — Progress Notes (Signed)
Talked with son To bring her She is fairly with it for 90 Tiny-cooperative. Gave preop instructions to son

## 2012-04-30 ENCOUNTER — Ambulatory Visit (HOSPITAL_BASED_OUTPATIENT_CLINIC_OR_DEPARTMENT_OTHER): Payer: Medicare Other | Admitting: Certified Registered Nurse Anesthetist

## 2012-04-30 ENCOUNTER — Encounter (HOSPITAL_BASED_OUTPATIENT_CLINIC_OR_DEPARTMENT_OTHER): Payer: Self-pay | Admitting: Anesthesiology

## 2012-04-30 ENCOUNTER — Encounter (HOSPITAL_BASED_OUTPATIENT_CLINIC_OR_DEPARTMENT_OTHER): Payer: Self-pay | Admitting: *Deleted

## 2012-04-30 ENCOUNTER — Encounter (HOSPITAL_BASED_OUTPATIENT_CLINIC_OR_DEPARTMENT_OTHER): Payer: Self-pay | Admitting: Certified Registered Nurse Anesthetist

## 2012-04-30 ENCOUNTER — Ambulatory Visit (HOSPITAL_BASED_OUTPATIENT_CLINIC_OR_DEPARTMENT_OTHER)
Admission: RE | Admit: 2012-04-30 | Discharge: 2012-04-30 | Disposition: A | Discharge status: Home / Self Care | Payer: Medicare Other | Source: Ambulatory Visit | Attending: Orthopedic Surgery | Admitting: Orthopedic Surgery

## 2012-04-30 ENCOUNTER — Encounter (HOSPITAL_BASED_OUTPATIENT_CLINIC_OR_DEPARTMENT_OTHER)
Admission: RE | Disposition: A | Discharge status: Home / Self Care | Payer: Self-pay | Source: Ambulatory Visit | Attending: Orthopedic Surgery

## 2012-04-30 DIAGNOSIS — M86179 Other acute osteomyelitis, unspecified ankle and foot: Secondary | ICD-10-CM | POA: Insufficient documentation

## 2012-04-30 DIAGNOSIS — M86679 Other chronic osteomyelitis, unspecified ankle and foot: Secondary | ICD-10-CM | POA: Insufficient documentation

## 2012-04-30 DIAGNOSIS — M869 Osteomyelitis, unspecified: Secondary | ICD-10-CM

## 2012-04-30 HISTORY — DX: Unspecified urinary incontinence: R32

## 2012-04-30 HISTORY — DX: Neuralgia and neuritis, unspecified: M79.2

## 2012-04-30 HISTORY — DX: Left bundle-branch block, unspecified: I44.7

## 2012-04-30 HISTORY — DX: Gastro-esophageal reflux disease without esophagitis: K21.9

## 2012-04-30 HISTORY — DX: Unspecified osteoarthritis, unspecified site: M19.90

## 2012-04-30 HISTORY — PX: AMPUTATION: SHX166

## 2012-04-30 HISTORY — DX: Restless legs syndrome: G25.81

## 2012-04-30 LAB — POCT I-STAT, CHEM 8
Calcium, Ion: 1.19 mmol/L (ref 1.12–1.32)
Glucose, Bld: 87 mg/dL (ref 70–99)
HCT: 40 % (ref 36.0–46.0)
Hemoglobin: 13.6 g/dL (ref 12.0–15.0)
TCO2: 30 mmol/L (ref 0–100)

## 2012-04-30 LAB — POCT HEMOGLOBIN-HEMACUE: Hemoglobin: 13.7 g/dL (ref 12.0–15.0)

## 2012-04-30 SURGERY — AMPUTATION DIGIT
Anesthesia: Choice | Site: Toe | Laterality: Right | Wound class: Clean

## 2012-04-30 MED ORDER — ONDANSETRON HCL 4 MG/2ML IJ SOLN: 4.0000 mg | Freq: Once | INTRAMUSCULAR | Status: DC | PRN

## 2012-04-30 MED ORDER — ACETAMINOPHEN 10 MG/ML IV SOLN
1000.0000 mg | Freq: Once | INTRAVENOUS | Status: AC
  Administered 2012-04-30: 600 mg via INTRAVENOUS

## 2012-04-30 MED ORDER — CHLORHEXIDINE GLUCONATE 4 % EX LIQD: 60.0000 mL | Freq: Once | CUTANEOUS | Status: DC

## 2012-04-30 MED ORDER — PROPOFOL 10 MG/ML IV EMUL
INTRAVENOUS | Status: DC | PRN
  Administered 2012-04-30: 20 mg via INTRAVENOUS

## 2012-04-30 MED ORDER — LACTATED RINGERS IV SOLN
INTRAVENOUS | Status: DC
  Administered 2012-04-30: 10:00:00 via INTRAVENOUS

## 2012-04-30 MED ORDER — FENTANYL CITRATE 0.05 MG/ML IJ SOLN
INTRAMUSCULAR | Status: DC | PRN
  Administered 2012-04-30 (×2): 25 ug via INTRAVENOUS

## 2012-04-30 MED ORDER — SODIUM CHLORIDE 0.9 % IV SOLN: INTRAVENOUS | Status: DC

## 2012-04-30 MED ORDER — PROPOFOL 10 MG/ML IV EMUL
INTRAVENOUS | Status: DC | PRN
  Administered 2012-04-30: 50 ug/kg/min via INTRAVENOUS

## 2012-04-30 MED ORDER — FENTANYL CITRATE 0.05 MG/ML IJ SOLN
50.0000 ug | INTRAMUSCULAR | Status: DC | PRN
  Administered 2012-04-30: 25 ug via INTRAVENOUS

## 2012-04-30 MED ORDER — HYDROCODONE-ACETAMINOPHEN 5-325 MG PO TABS: 1.0000 | ORAL_TABLET | Freq: Four times a day (QID) | ORAL | Status: AC | PRN

## 2012-04-30 MED ORDER — FENTANYL CITRATE 0.05 MG/ML IJ SOLN: 25.0000 ug | INTRAMUSCULAR | Status: DC | PRN

## 2012-04-30 MED ORDER — CEFAZOLIN SODIUM 1-5 GM-% IV SOLN: 1.0000 g | INTRAVENOUS | Status: DC

## 2012-04-30 MED ORDER — VITAMIN C 500 MG PO TABS: 500.0000 mg | ORAL_TABLET | Freq: Four times a day (QID) | ORAL | Status: DC | PRN

## 2012-04-30 SURGICAL SUPPLY — 49 items
BANDAGE ELASTIC 4 VELCRO ST LF (GAUZE/BANDAGES/DRESSINGS) ×2 IMPLANT
BLADE SURG 15 STRL LF DISP TIS (BLADE) ×1 IMPLANT
BLADE SURG 15 STRL SS (BLADE) ×4
BNDG CMPR MD 5X2 ELC HKLP STRL (GAUZE/BANDAGES/DRESSINGS) ×1
BNDG ELASTIC 2 VLCR STRL LF (GAUZE/BANDAGES/DRESSINGS) ×1 IMPLANT
BRUSH SCRUB EZ PLAIN DRY (MISCELLANEOUS) ×2 IMPLANT
CLOTH BEACON ORANGE TIMEOUT ST (SAFETY) ×2 IMPLANT
COVER TABLE BACK 60X90 (DRAPES) ×2 IMPLANT
DRAPE EXTREMITY T 121X128X90 (DRAPE) ×2 IMPLANT
DRAPE SURG 17X23 STRL (DRAPES) ×2 IMPLANT
DRSG PAD ABDOMINAL 8X10 ST (GAUZE/BANDAGES/DRESSINGS) ×2 IMPLANT
DURA STEPPER LG (CAST SUPPLIES) ×1 IMPLANT
DURA STEPPER MED (CAST SUPPLIES) ×1 IMPLANT
DURA STEPPER SML (CAST SUPPLIES) ×1 IMPLANT
ELECT REM PT RETURN 9FT ADLT (ELECTROSURGICAL) ×2
ELECTRODE REM PT RTRN 9FT ADLT (ELECTROSURGICAL) ×1 IMPLANT
GAUZE XEROFORM 1X8 LF (GAUZE/BANDAGES/DRESSINGS) ×2 IMPLANT
GLOVE BIO SURGEON STRL SZ8 (GLOVE) ×2 IMPLANT
GLOVE BIOGEL M STRL SZ7.5 (GLOVE) ×1 IMPLANT
GLOVE BIOGEL PI IND STRL 8 (GLOVE) ×2 IMPLANT
GLOVE BIOGEL PI INDICATOR 8 (GLOVE) ×2
GLOVE SURG SS PI 8.0 STRL IVOR (GLOVE) ×2 IMPLANT
GOWN BRE IMP PREV XXLGXLNG (GOWN DISPOSABLE) ×3 IMPLANT
GOWN PREVENTION PLUS XLARGE (GOWN DISPOSABLE) ×2 IMPLANT
NEEDLE HYPO 22GX1.5 SAFETY (NEEDLE) ×1 IMPLANT
NS IRRIG 1000ML POUR BTL (IV SOLUTION) ×2 IMPLANT
PACK BASIN DAY SURGERY FS (CUSTOM PROCEDURE TRAY) ×2 IMPLANT
PAD CAST 4YDX4 CTTN HI CHSV (CAST SUPPLIES) ×1 IMPLANT
PADDING CAST ABS 4INX4YD NS (CAST SUPPLIES)
PADDING CAST ABS COTTON 4X4 ST (CAST SUPPLIES) ×1 IMPLANT
PADDING CAST COTTON 4X4 STRL (CAST SUPPLIES) ×2
PENCIL BUTTON HOLSTER BLD 10FT (ELECTRODE) ×2 IMPLANT
SHEET MEDIUM DRAPE 40X70 STRL (DRAPES) ×2 IMPLANT
SPONGE GAUZE 4X4 12PLY (GAUZE/BANDAGES/DRESSINGS) ×2 IMPLANT
STOCKINETTE 6  STRL (DRAPES) ×1
STOCKINETTE 6 STRL (DRAPES) ×1 IMPLANT
SUT BONE WAX W31G (SUTURE) ×1 IMPLANT
SUT ETHILON 4 0 PS 2 18 (SUTURE) ×2 IMPLANT
SUT MON AB 4-0 PC3 18 (SUTURE) ×1 IMPLANT
SUT VIC AB 2-0 PS2 27 (SUTURE) ×1 IMPLANT
SUT VIC AB 3-0 PS1 18 (SUTURE)
SUT VIC AB 3-0 PS1 18XBRD (SUTURE) ×1 IMPLANT
SYR 20CC LL (SYRINGE) ×2 IMPLANT
SYR BULB 3OZ (MISCELLANEOUS) ×2 IMPLANT
SYR CONTROL 10ML LL (SYRINGE) ×2 IMPLANT
TOWEL OR 17X24 6PK STRL BLUE (TOWEL DISPOSABLE) ×2 IMPLANT
TOWEL OR NON WOVEN STRL DISP B (DISPOSABLE) ×2 IMPLANT
UNDERPAD 30X30 INCONTINENT (UNDERPADS AND DIAPERS) ×2 IMPLANT
WATER STERILE IRR 1000ML POUR (IV SOLUTION) ×2 IMPLANT

## 2012-04-30 NOTE — Transfer of Care (Signed)
Immediate Anesthesia Transfer of Care Note  Patient: Kerry Myers  Procedure(s) Performed: Procedure(s) (LRB): AMPUTATION DIGIT (Right)  Patient Location: PACU  Anesthesia Type: MAC  Level of Consciousness: sedated  Airway & Oxygen Therapy: Patient Spontanous Breathing and Patient connected to face mask oxygen  Post-op Assessment: Report given to PACU RN and Post -op Vital signs reviewed and stable  Post vital signs: Reviewed and stable  Complications: No apparent anesthesia complications

## 2012-04-30 NOTE — H&P (Signed)
  H&P documentation: Placed to be scanned history and physical exam in chart.  -History and Physical Reviewed  -Patient has been re-examined  -No change in the plan of care  Mayukha Symmonds A  

## 2012-04-30 NOTE — Anesthesia Postprocedure Evaluation (Signed)
  Anesthesia Post-op Note  Patient: Kerry Myers  Procedure(s) Performed: Procedure(s) (LRB): AMPUTATION DIGIT (Right)  Patient Location: PACU  Anesthesia Type: MAC and MAC combined with regional for post-op pain  Level of Consciousness: awake, alert  and oriented  Airway and Oxygen Therapy: Patient Spontanous Breathing  Post-op Pain: none  Post-op Assessment: Post-op Vital signs reviewed and Patient's Cardiovascular Status Stable  Post-op Vital Signs: stable  Complications: No apparent anesthesia complications

## 2012-04-30 NOTE — Anesthesia Preprocedure Evaluation (Signed)
Anesthesia Evaluation  Patient identified by MRN, date of birth, ID band Patient awake    Airway       Dental   Pulmonary          Cardiovascular     Neuro/Psych    GI/Hepatic   Endo/Other    Renal/GU      Musculoskeletal   Abdominal   Peds  Hematology   Anesthesia Other Findings   Reproductive/Obstetrics                           Anesthesia Physical Anesthesia Plan  ASA: III  Anesthesia Plan: MAC   Post-op Pain Management:    Induction: Intravenous  Airway Management Planned:   Additional Equipment:   Intra-op Plan:   Post-operative Plan:   Informed Consent: I have reviewed the patients History and Physical, chart, labs and discussed the procedure including the risks, benefits and alternatives for the proposed anesthesia with the patient or authorized representative who has indicated his/her understanding and acceptance.   Dental advisory given  Plan Discussed with: CRNA and Surgeon  Anesthesia Plan Comments: (Gangrene L. 2and toe Peripheral neuropathy LBB Pulmonary htn noted on echo  Plan ankle block with MAC  Kipp Brood, MD)        Anesthesia Quick Evaluation

## 2012-04-30 NOTE — Discharge Instructions (Signed)
°  Post Anesthesia Home Care Instructions ° °Activity: °Get plenty of rest for the remainder of the day. A responsible adult should stay with you for 24 hours following the procedure.  °For the next 24 hours, DO NOT: °-Drive a car °-Operate machinery °-Drink alcoholic beverages °-Take any medication unless instructed by your physician °-Make any legal decisions or sign important papers. ° °Meals: °Start with liquid foods such as gelatin or soup. Progress to regular foods as tolerated. Avoid greasy, spicy, heavy foods. If nausea and/or vomiting occur, drink only clear liquids until the nausea and/or vomiting subsides. Call your physician if vomiting continues. ° °Special Instructions/Symptoms: °Your throat may feel dry or sore from the anesthesia or the breathing tube placed in your throat during surgery. If this causes discomfort, gargle with warm salt water. The discomfort should disappear within 24 hours. ° °Regional Anesthesia Blocks ° °1. Numbness or the inability to move the "blocked" extremity may last from 3-48 hours after placement. The length of time depends on the medication injected and your individual response to the medication. If the numbness is not going away after 48 hours, call your surgeon. ° °2. The extremity that is blocked will need to be protected until the numbness is gone and the  Strength has returned. Because you cannot feel it, you will need to take extra care to avoid injury. Because it may be weak, you may have difficulty moving it or using it. You may not know what position it is in without looking at it while the block is in effect. ° °3. For blocks in the legs and feet, returning to weight bearing and walking needs to be done carefully. You will need to wait until the numbness is entirely gone and the strength has returned. You should be able to move your leg and foot normally before you try and bear weight or walk. You will need someone to be with you when you first try to ensure you  do not fall and possibly risk injury. ° °4. Bruising and tenderness at the needle site are common side effects and will resolve in a few days. ° °5. Persistent numbness or new problems with movement should be communicated to the surgeon or the [facility] ([phone])/  Surgery Center ([phone]). °

## 2012-04-30 NOTE — Progress Notes (Signed)
Assisted Dr. Joslin with right, ankle block. Side rails up, monitors on throughout procedure. See vital signs in flow sheet. Tolerated Procedure well. 

## 2012-04-30 NOTE — Brief Op Note (Signed)
04/30/2012  11:15 AM  PATIENT:  Kerry Myers  78 y.o. female  PRE-OPERATIVE DIAGNOSIS:  right 2nd toe osteomylitis  POST-OPERATIVE DIAGNOSIS:  * No post-op diagnosis entered *  PROCEDURE:  Procedure(s) (LRB): AMPUTATION DIGIT (Right) 2nd toe @ MTP joint  SURGEON:  Surgeon(s) and Role:    * Sherri Rad, MD - Primary  PHYSICIAN ASSISTANT: Rexene Edison, PAC   ASSISTANTS: Above   ANESTHESIA:   general  EBL:  Total I/O In: 300 [I.V.:300] Out: -   BLOOD ADMINISTERED:none  DRAINS: none   LOCAL MEDICATIONS USED:  NONE  SPECIMEN:  No Specimen  DISPOSITION OF SPECIMEN:  N/A  COUNTS:  YES  TOURNIQUET:  * Missing tourniquet times found for documented tourniquets in log:  16109 *  DICTATION: .Other Dictation: Dictation Number [id number]  PLAN OF CARE: Discharge to home after PACU  PATIENT DISPOSITION:  PACU - hemodynamically stable.   Delay start of Pharmacological VTE agent (>24hrs) due to surgical blood loss or risk of bleeding: no

## 2012-05-01 NOTE — Op Note (Signed)
Kerry Myers, Kerry Myers NO.:  [id number]  MEDICAL RECORD NO.:  [id number]  LOCATION:                                 FACILITY:  PHYSICIAN:  Leonides Grills, M.D.     DATE OF BIRTH:  17-Jan-1919  DATE OF PROCEDURE:  04/30/2012 DATE OF DISCHARGE:                              OPERATIVE REPORT   PREOPERATIVE DIAGNOSIS:  Osteomyelitis right second toe.  POSTOPERATIVE DIAGNOSIS:  Osteomyelitis right second toe.  OPERATION:  Right second toe amputation through MTP joint.  ANESTHESIA:  General.  SURGEON:  Leonides Grills, M.D.  ASSISTANT:  Richardean Canal, PA-C.  ESTIMATED BLOOD LOSS:  Minimal.  TOURNIQUET:  None.  COMPLICATIONS:  None.  DISPOSITION:  Stable to PR.  INDICATION:  This is a 78 year old female, who developed osteomyelitis of right second toe from chronic ulcer.  She was consented to the above procedure.  All risks, infection, vessel injury, wound healing problems, persistent pain, worse pain, prolonged recovery, and possibility of development of hallux valgus deformity were all explained.  Questions were encouraged and answered.  OPERATION:  The patient was brought to the operative room and placed in supine position.  After adequate general anesthesia was administered with ankle block as well as Ancef 1 g IV piggyback, the right lower extremity was then prepped and draped in the sterile manner.  No tourniquet was used.  Rack shaped incision based dorsally was then made. Dissection was carried down directly to bone as a full-thickness flap. The toe was then disarticulated from the MTP joint.  Hemostasis was obtained.  The area was copiously irrigated with normal saline.  Subcu was closed with 3-0 Vicryl.  Skin was closed with 4-0 nylon.  Sterile dressing was applied.  Hard sole shoes applied.  The patient was stable to PR.     Leonides Grills, M.D.     PB/MEDQ  D:  04/30/2012  T:  04/30/2012  Job:  [id number]

## 2012-05-05 ENCOUNTER — Encounter (HOSPITAL_BASED_OUTPATIENT_CLINIC_OR_DEPARTMENT_OTHER): Payer: Self-pay | Admitting: Orthopedic Surgery

## 2012-05-06 ENCOUNTER — Institutional Professional Consult (permissible substitution): Payer: Medicare Other | Admitting: Cardiovascular Disease

## 2012-05-14 ENCOUNTER — Other Ambulatory Visit: Payer: Self-pay | Admitting: Family Medicine

## 2012-05-14 DIAGNOSIS — M8970 Major osseous defect, unspecified site: Secondary | ICD-10-CM

## 2012-05-14 DIAGNOSIS — I739 Peripheral vascular disease, unspecified: Secondary | ICD-10-CM

## 2012-05-14 DIAGNOSIS — R269 Unspecified abnormalities of gait and mobility: Secondary | ICD-10-CM

## 2012-05-14 DIAGNOSIS — Z4789 Encounter for other orthopedic aftercare: Secondary | ICD-10-CM

## 2012-05-14 MED ORDER — HYDROCODONE-ACETAMINOPHEN 5-325 MG PO TABS: 1.0000 | ORAL_TABLET | Freq: Four times a day (QID) | ORAL | Status: AC | PRN

## 2012-07-21 ENCOUNTER — Telehealth: Payer: Self-pay | Admitting: Family Medicine

## 2012-07-21 ENCOUNTER — Emergency Department (HOSPITAL_COMMUNITY)
Admission: EM | Admit: 2012-07-21 | Discharge: 2012-07-21 | Disposition: A | Discharge status: Home / Self Care | Payer: Medicare Other | Attending: Emergency Medicine | Admitting: Emergency Medicine

## 2012-07-21 ENCOUNTER — Encounter (HOSPITAL_COMMUNITY): Payer: Self-pay

## 2012-07-21 ENCOUNTER — Emergency Department (HOSPITAL_COMMUNITY): Payer: Medicare Other

## 2012-07-21 DIAGNOSIS — R109 Unspecified abdominal pain: Secondary | ICD-10-CM

## 2012-07-21 DIAGNOSIS — K219 Gastro-esophageal reflux disease without esophagitis: Secondary | ICD-10-CM | POA: Insufficient documentation

## 2012-07-21 DIAGNOSIS — G2581 Restless legs syndrome: Secondary | ICD-10-CM | POA: Insufficient documentation

## 2012-07-21 DIAGNOSIS — G589 Mononeuropathy, unspecified: Secondary | ICD-10-CM | POA: Insufficient documentation

## 2012-07-21 DIAGNOSIS — K5900 Constipation, unspecified: Secondary | ICD-10-CM

## 2012-07-21 DIAGNOSIS — Z79899 Other long term (current) drug therapy: Secondary | ICD-10-CM | POA: Insufficient documentation

## 2012-07-21 LAB — URINALYSIS, ROUTINE W REFLEX MICROSCOPIC
Bilirubin Urine: NEGATIVE
Ketones, ur: NEGATIVE mg/dL
Leukocytes, UA: NEGATIVE
Nitrite: NEGATIVE
Urobilinogen, UA: 0.2 mg/dL (ref 0.0–1.0)
pH: 7 (ref 5.0–8.0)

## 2012-07-21 LAB — CBC WITH DIFFERENTIAL/PLATELET
Basophils Relative: 0 % (ref 0–1)
Eosinophils Absolute: 0 10*3/uL (ref 0.0–0.7)
HCT: 40.3 % (ref 36.0–46.0)
Hemoglobin: 13.2 g/dL (ref 12.0–15.0)
Lymphs Abs: 1.2 10*3/uL (ref 0.7–4.0)
MCH: 31.4 pg (ref 26.0–34.0)
MCHC: 32.8 g/dL (ref 30.0–36.0)
Monocytes Absolute: 0.3 10*3/uL (ref 0.1–1.0)
Monocytes Relative: 7 % (ref 3–12)
RBC: 4.2 MIL/uL (ref 3.87–5.11)

## 2012-07-21 LAB — COMPREHENSIVE METABOLIC PANEL
Albumin: 3.8 g/dL (ref 3.5–5.2)
Alkaline Phosphatase: 62 U/L (ref 39–117)
BUN: 20 mg/dL (ref 6–23)
Chloride: 101 mEq/L (ref 96–112)
Creatinine, Ser: 0.54 mg/dL (ref 0.50–1.10)
GFR calc Af Amer: >90 mL/min (ref >90)
Glucose, Bld: 91 mg/dL (ref 70–99)
Total Bilirubin: 0.3 mg/dL (ref 0.3–1.2)
Total Protein: 7.4 g/dL (ref 6.0–8.3)

## 2012-07-21 LAB — LIPASE, BLOOD: Lipase: 22 U/L (ref 11–59)

## 2012-07-21 MED ORDER — HYDROCODONE-ACETAMINOPHEN 5-325 MG PO TABS: 1.0000 | ORAL_TABLET | ORAL | Status: AC | PRN

## 2012-07-21 MED ORDER — IOHEXOL 300 MG/ML  SOLN
80.0000 mL | Freq: Once | INTRAMUSCULAR | Status: AC | PRN
  Administered 2012-07-21: 80 mL via INTRAVENOUS

## 2012-07-21 NOTE — Telephone Encounter (Signed)
Please make sure pt went----

## 2012-07-21 NOTE — ED Provider Notes (Signed)
CT shows nothing acute, mild constipation. The patient denies pain currently. Discussed findings with family who will follow up with dr. Hulan Saas this week for recheck. Norco given for pain and instructed to use sparingly as it contributes to constipation.  Rodena Medin, PA-C 07/21/12 1538

## 2012-07-21 NOTE — ED Notes (Signed)
Pt ambulated with assistance to restroom to provide urine sample.

## 2012-07-21 NOTE — Telephone Encounter (Signed)
She went it is on the system.    KP

## 2012-07-21 NOTE — Telephone Encounter (Signed)
Caller: [name]/Child (If UTR Call [phone]); PCP: Lelon Perla.; CB#: [phone];  Call regarding Worsening Constant Pain Under L Ribcage;  Onset: 07/18/12. Afebrile.  Pain estimated to be 7/8 out of 10.  Weakness has increased. No diaphoresis or nausea.  Mild confusion. No rash, or bruising.  Denies any falls.   Advised to call 911 now for chest pain lasting 5 min or longer within the last hr per Chest Pain Guideline.

## 2012-07-21 NOTE — ED Notes (Signed)
Dr. Plunkett is at the bedside.  

## 2012-07-21 NOTE — ED Notes (Signed)
Patient was brought in by ambulance with complaint of LLQ pain onset a week that got worse today. Pt denies any N/V/D, no fever. Patient is A/A/Ox4, skin is warm and dry, respiration is even and unlabored.

## 2012-07-21 NOTE — ED Provider Notes (Signed)
History     CSN: [id number]  Arrival date & time 07/21/12  1107   First MD Initiated Contact with Patient 07/21/12 1111      Chief Complaint  Patient presents with  . Abdominal Pain    (Consider location/radiation/quality/duration/timing/severity/associated sxs/prior treatment) Patient is a 78 y.o. female presenting with abdominal pain. The history is provided by the patient.  Abdominal Pain The primary symptoms of the illness include abdominal pain. The primary symptoms of the illness do not include fever, shortness of breath, nausea, vomiting or diarrhea. Episode onset: One week ago. The onset of the illness was gradual. The problem has been gradually worsening.  The abdominal pain is located in the LUQ and LLQ. The abdominal pain does not radiate. The severity of the abdominal pain is 8/10. The abdominal pain is relieved by nothing. Exacerbated by: Does not seem to get worse with eating.  The patient has not had a change in bowel habit. Additional symptoms associated with the illness include anorexia. Symptoms associated with the illness do not include constipation, urgency, frequency or back pain. Significant associated medical issues do not include gallstones or diverticulitis.    Past Medical History  Diagnosis Date  . GERD (gastroesophageal reflux disease)   . Bladder incontinence   . Neuropathic pain   . RLS (restless legs syndrome)   . Arthritis   . LBBB (left bundle branch block)     Past Surgical History  Procedure Date  . Tonsillectomy   . Abdominal hysterectomy     TVH  . Incontinence surgery   . Excision morton's neuroma   . Eye surgery     cataracts  . Amputation 04/30/2012    Procedure: AMPUTATION DIGIT;  Surgeon: Sherri Rad, MD;  Location: [facility];  Service: Orthopedics;  Laterality: Right;  right 2nd toe amptation through MTP joint    No family history on file.  History  Substance Use Topics  . Smoking status: Never Smoker   .  Smokeless tobacco: Not on file  . Alcohol Use: No    OB History    Grav Para Term Preterm Abortions TAB SAB Ect Mult Living                  Review of Systems  Constitutional: Negative for fever.  Respiratory: Negative for shortness of breath.   Gastrointestinal: Positive for abdominal pain and anorexia. Negative for nausea, vomiting, diarrhea and constipation.  Genitourinary: Negative for urgency and frequency.  Musculoskeletal: Negative for back pain.  All other systems reviewed and are negative.    Allergies  Review of patient's allergies indicates no known allergies.  Home Medications   Current Outpatient Rx  Name Route Sig Dispense Refill  . CALCIUM CARBONATE 600 MG PO TABS Oral Take 600 mg by mouth daily.    Marland Kitchen VITAMIN D 1000 UNITS PO TABS Oral Take 1,000 Units by mouth daily.    . OMEGA-3 FATTY ACIDS 1000 MG PO CAPS Oral Take 1 g by mouth daily.    Marland Kitchen GABAPENTIN 100 MG PO CAPS Oral Take 100 mg by mouth 3 (three) times daily.     Marland Kitchen HYDROCODONE-ACETAMINOPHEN 5-325 MG PO TABS Oral Take 1 tablet by mouth every 6 (six) hours as needed. For pain    . L-METHYLFOLATE-B6-B12 3-35-2 MG PO TABS Oral Take 1 tablet by mouth daily. 90 tablet 1  . ADULT MULTIVITAMIN W/MINERALS CH Oral Take 1 tablet by mouth daily.    Marland Kitchen OMEPRAZOLE 40 MG PO  CPDR Oral Take 1 capsule (40 mg total) by mouth daily. 90 capsule 1  . PRAMIPEXOLE DIHYDROCHLORIDE 0.5 MG PO TABS Oral Take 0.5 mg by mouth 2 (two) times daily.    Marland Kitchen SOLIFENACIN SUCCINATE 10 MG PO TABS Oral Take 5 mg by mouth 2 (two) times daily. Marland Kitchen5 in the morning and .5 in the evening    . TRAMADOL HCL 50 MG PO TABS Oral Take 50 mg by mouth every 6 (six) hours as needed. For pain      BP 165/83  Pulse 85  Temp 98.4 F (36.9 C) (Oral)  Resp 15  SpO2 98%  Physical Exam  Nursing note and vitals reviewed. Constitutional: She is oriented to person, place, and time. She appears well-developed and well-nourished. No distress.  HENT:  Head:  Normocephalic and atraumatic.  Mouth/Throat: Mucous membranes are dry.  Eyes: EOM are normal. Pupils are equal, round, and reactive to light.  Cardiovascular: Normal rate, regular rhythm, normal heart sounds and intact distal pulses.  Exam reveals no friction rub.   No murmur heard. Pulmonary/Chest: Effort normal and breath sounds normal. She has no wheezes. She has no rales.  Abdominal: Soft. Bowel sounds are normal. She exhibits no distension. There is tenderness in the left lower quadrant. There is guarding. There is no rebound.  Musculoskeletal: Normal range of motion. She exhibits no tenderness.       No edema  Neurological: She is alert and oriented to person, place, and time. No cranial nerve deficit.  Skin: Skin is warm and dry. No rash noted.  Psychiatric: She has a normal mood and affect. Her behavior is normal.    ED Course  Procedures (including critical care time)   Labs Reviewed  CBC WITH DIFFERENTIAL  COMPREHENSIVE METABOLIC PANEL  LIPASE, BLOOD  URINALYSIS, ROUTINE W REFLEX MICROSCOPIC   No results found.   No diagnosis found.    MDM   Patient with a history of one week of abdominal pain. She states her pain is in the left upper quadrant however on exam she has guarding and tenderness in her left lower quadrant. Denies any vomiting or stool changes. No prior abdominal surgeries other than a hysterectomy. Patient's labs are within normal limits and she does not appear dehydrated. Turbid diverticulitis versus other abdominal pathology such as pancreatitis. CBC, CMP, lipase, UA, CT of abdomen and pelvis pending for further evaluation        Kerry Sprout, MD 07/21/12 1521

## 2012-07-22 ENCOUNTER — Telehealth: Payer: Self-pay | Admitting: Family Medicine

## 2012-07-22 NOTE — Telephone Encounter (Signed)
We are part of [facility] so we automatically get results from [facility]

## 2012-07-22 NOTE — Telephone Encounter (Signed)
Caller: [name]/Child; Phone Number: [phone]; Message from caller: He took his mom to [facility] ED yest for side pain.  He said they did a bunch of test and were going to be sending them to [place].  He would like to know when tests are in so he can schedule a follow up visit for her.

## 2012-07-23 ENCOUNTER — Ambulatory Visit (INDEPENDENT_AMBULATORY_CARE_PROVIDER_SITE_OTHER): Payer: Medicare Other | Admitting: Internal Medicine

## 2012-07-23 ENCOUNTER — Telehealth: Payer: Self-pay | Admitting: Family Medicine

## 2012-07-23 ENCOUNTER — Ambulatory Visit: Payer: Medicare Other | Admitting: Family Medicine

## 2012-07-23 ENCOUNTER — Encounter: Payer: Self-pay | Admitting: Internal Medicine

## 2012-07-23 VITALS — BP 106/68 | HR 72 | Temp 98.2°F | Wt 83.2 lb

## 2012-07-23 DIAGNOSIS — M545 Low back pain, unspecified: Secondary | ICD-10-CM

## 2012-07-23 MED ORDER — FENTANYL 25 MCG/HR TD PT72: 1.0000 | MEDICATED_PATCH | TRANSDERMAL | Status: DC

## 2012-07-23 NOTE — Patient Instructions (Addendum)
Assess response to the gabapentin one every 8 hours as needed. If it is partially beneficial, it can be increased up to a total of 3 pills every 8 hours as needed. This increase of 1 pill each dose  should take place over 72 hours at least. 

## 2012-07-23 NOTE — Progress Notes (Signed)
  Subjective:    Patient ID: Kerry Myers, female    DOB: Jul 17, 1919, 78 y.o.   MRN: [id number]  HPI She was seen in emergency room 07/21/12 with L thoracic  pain. Those records were reviewed. The CBC and differential and chemistries were normal except for mildly reduced GFR. The CAT scan showed diverticulosis without diverticulitis. There are  severe degenerative changes and scoliosis of the lumbar spine. Retained stool was present. The family was told the pain was due to constipation in context of narcotics for pain. She is taking hydrocodone half a pill 3 times a day      Review of Systems She does have 2-3 bowel movements a day with stool softeners and MiraLax.     Objective:   Physical Exam Frail with limb atrophy.  She has no increase work of breathing; she is minimal scattered rhonchi.  Regular rhythm is present with an S4  She has minimal tenderness to deep palpation of the abdomen.  She has striking thoracolumbar deformities with tenderness to palpation on the left        Assessment & Plan:  #1 musculoskeletal pain related to degenerative and scoliotic changes  #2 some stool retention undoubtedly aggravated by narcotic use  Plan: She may obtain better pain relief with Fentanyl  pain patches with less impact on the gut. She also may be a candidate for the topical Voltaren patch. Additionally increasing  gabapentin may help some of her pain and discomfort

## 2012-07-23 NOTE — Telephone Encounter (Signed)
FYI:pt to have apt with MD Alwyn Ren today 07-23-12

## 2012-07-23 NOTE — Telephone Encounter (Signed)
Caller: [name]/Child; PCP: Lelon Perla.; CB#: [phone]; Call regarding Lower Left Rib Pain;  Onset: 07/18/12.  After triage 07/21/12, was taken by ambulance to [facility] ED. CT of chest/abdomen and lab done.  ED sent info to Dr Laury Axon.  Continues to have constant pain from L lower "rib cage pushing in."  ED advised follow up this week with Dr Laury Axon. Poor appetite. Ongoing weakness.  Hx of twisted spine; rib is pushing in on her side. Pain is better. Caregiver gave Miralax &  prune juice with results.  Describes constant, deep pain.  Advised to see MD now for pain described as deep, boring or tearing per Abdominal Pain guideline. 30 minute appt scheduled with MD of PM, Dr Alwyn Ren 07/23/12 at 1515.

## 2012-07-25 ENCOUNTER — Ambulatory Visit: Payer: Medicare Other | Admitting: Family Medicine

## 2012-07-27 NOTE — ED Provider Notes (Signed)
Medical screening examination/treatment/procedure(s) were conducted as a shared visit with non-physician practitioner(s) and myself.  I personally evaluated the patient during the encounter   Gwyneth Sprout, MD 07/27/12 2120

## 2012-07-29 ENCOUNTER — Ambulatory Visit: Payer: Medicare Other | Admitting: Family Medicine

## 2012-08-01 ENCOUNTER — Telehealth: Payer: Self-pay | Admitting: *Deleted

## 2012-08-01 DIAGNOSIS — M545 Low back pain: Secondary | ICD-10-CM

## 2012-08-01 NOTE — Telephone Encounter (Signed)
Pt husband states that  fentaNYL (DURAGESIC) 25 MCG is helping with Pt pain and would like to know if Pt is continue with this if so a new Rx is needed.Please advise

## 2012-08-01 NOTE — Telephone Encounter (Signed)
Ok to refill patches x1

## 2012-08-04 MED ORDER — FENTANYL 25 MCG/HR TD PT72: 1.0000 | MEDICATED_PATCH | TRANSDERMAL | Status: DC

## 2012-08-04 NOTE — Telephone Encounter (Signed)
Son has been made aware Rx ready for pick up.    KP

## 2012-08-19 ENCOUNTER — Other Ambulatory Visit: Payer: Self-pay | Admitting: *Deleted

## 2012-08-19 ENCOUNTER — Other Ambulatory Visit: Payer: Self-pay | Admitting: Family Medicine

## 2012-08-19 DIAGNOSIS — M546 Pain in thoracic spine: Secondary | ICD-10-CM

## 2012-08-19 MED ORDER — FENTANYL 25 MCG/HR TD PT72: 1.0000 | MEDICATED_PATCH | TRANSDERMAL | Status: DC

## 2012-08-19 NOTE — Telephone Encounter (Signed)
i tried to print rx with do not fill until 9/5 on it but i am unable to print.

## 2012-08-19 NOTE — Telephone Encounter (Signed)
Last OV 07-23-12, last filled 08-04-12 #5

## 2012-08-19 NOTE — Telephone Encounter (Signed)
Too soon

## 2012-08-19 NOTE — Telephone Encounter (Signed)
Pt son indicated that Pt only has 1 patch left that she will put on tomorrow. Pt son notes that it is too soon but states that he was trying to save a drive since he would be out in our area today he though that he could just go ahead and pick up Rx and take it to the pharmacy because they will not fill Rx until it is time any way so he does not understand why Rx cant just be written now.Please advise

## 2012-08-19 NOTE — Telephone Encounter (Signed)
Rx ready for pickup Pt son aware.

## 2012-09-02 ENCOUNTER — Telehealth: Payer: Self-pay | Admitting: Family Medicine

## 2012-09-02 MED ORDER — TRAMADOL HCL 50 MG PO TABS: 50.0000 mg | ORAL_TABLET | Freq: Four times a day (QID) | ORAL | Status: DC | PRN

## 2012-09-02 NOTE — Telephone Encounter (Signed)
Refill: Tramadol hcl tab 50mg . Take 1 tablet by mouth every 6 hours for pain. Qty 180. Last fill 03-27-12

## 2012-09-02 NOTE — Telephone Encounter (Signed)
Last seen 03/19/12 and filled 03/27/12 # 180. Please advise    KP

## 2012-09-02 NOTE — Telephone Encounter (Signed)
Rx sent 

## 2012-09-02 NOTE — Telephone Encounter (Signed)
Refill x1 

## 2012-09-04 ENCOUNTER — Other Ambulatory Visit: Payer: Self-pay | Admitting: *Deleted

## 2012-09-04 DIAGNOSIS — M545 Low back pain: Secondary | ICD-10-CM

## 2012-09-04 MED ORDER — FENTANYL 25 MCG/HR TD PT72: 1.0000 | MEDICATED_PATCH | TRANSDERMAL | Status: DC

## 2012-09-04 NOTE — Telephone Encounter (Signed)
Kerry Myers is aware the RX ready for pick up.     KP

## 2012-09-04 NOTE — Telephone Encounter (Signed)
Last OV 07-23-12, last filled 08-19-12 #5.Please advise

## 2012-09-04 NOTE — Telephone Encounter (Signed)
Refill x1 

## 2012-09-12 ENCOUNTER — Telehealth: Payer: Self-pay | Admitting: Family Medicine

## 2012-09-12 MED ORDER — PROMETHAZINE HCL 25 MG PO TABS: 25.0000 mg | ORAL_TABLET | Freq: Four times a day (QID) | ORAL | Status: DC | PRN

## 2012-09-12 NOTE — Telephone Encounter (Signed)
Caller: Verline Lema; Patient Name: Kerry Myers; PCP: Lelon Perla.; Best Callback Phone Number: [phone]; Call regarding Nausea, decrease food and fluid intake after starting Fentanyl Patch end of July 2013.  Patient weighs 83 lbs, has lost 5 lbs within a month. Afebrile. Patient has urinated within 8 hours, holding down fluids. All emergent symptoms ruled out per Nausea Protocol, see in 24 hours, due to symptoms began after starting new prescription.  Appointment offered, Spouse refused, said Patient is too tried to come in today.  Last office visit was 7-24. Spouse would like anti-nausea medication called into [facility], [phone].

## 2012-09-12 NOTE — Telephone Encounter (Signed)
Discussed with son [name] and he would like to decrease if there is a smaller dose.  Please advise     KP

## 2012-09-12 NOTE — Telephone Encounter (Signed)
DR. Laury Axon is this OK?  Please advise.

## 2012-09-12 NOTE — Telephone Encounter (Signed)
Would they like to decease dose to 12.5? Phenergan 25mg  1 po qid prn

## 2012-09-14 NOTE — Telephone Encounter (Signed)
12.5 mg  fent patch-- same instructions

## 2012-09-15 MED ORDER — FENTANYL 12 MCG/HR TD PT72: 1.0000 | MEDICATED_PATCH | TRANSDERMAL | Status: DC

## 2012-09-15 NOTE — Telephone Encounter (Signed)
Rx printed an ready for pick up. Kerry Myers made aware.        KP

## 2012-10-06 ENCOUNTER — Other Ambulatory Visit: Payer: Self-pay | Admitting: Family Medicine

## 2012-10-06 MED ORDER — FENTANYL 12 MCG/HR TD PT72: 1.0000 | MEDICATED_PATCH | TRANSDERMAL | Status: DC

## 2012-10-06 NOTE — Telephone Encounter (Signed)
Last seen 03/19/12 and filled 09/15/12 #5. Please advise     KP

## 2012-10-06 NOTE — Telephone Encounter (Signed)
Caller: [name]/SON ; Patient Name: Kerry Myers; PCP: Lelon Perla.; Best Callback Phone Number: [phone].  Call regarding refill for  Fentanyl Patch.  Pt used her last patch.  No symptoms to triage. Per [name] pt is doing well on the current dose. [name] will be in town 10/07/12 in the am and is wanting to pick up the script in the morning after he gets his flu shot.  [name]'s mobile number is  [phone].  PLEASE FOLLOW UP WITH [name]/SON WHEN SCRIPT IS READY. Thank you.

## 2012-10-10 ENCOUNTER — Other Ambulatory Visit: Payer: Self-pay

## 2012-10-10 MED ORDER — PRAMIPEXOLE DIHYDROCHLORIDE 0.5 MG PO TABS: 0.5000 mg | ORAL_TABLET | Freq: Two times a day (BID) | ORAL | Status: DC

## 2012-10-10 MED ORDER — OMEPRAZOLE 40 MG PO CPDR: 40.0000 mg | DELAYED_RELEASE_CAPSULE | Freq: Every day | ORAL | Status: DC

## 2012-10-10 NOTE — Telephone Encounter (Signed)
refill Gabapentin (Cap) 100 MG Take 100 mg by mouth 3 (three) times daily. -- per fax received today from [facility]

## 2012-10-10 NOTE — Telephone Encounter (Signed)
OV 07/23/12. Last filled 03/27/12 #90 x 1 omeprazole, pramipexole never been filled by you that I can see.   Plz advise   MW

## 2012-10-13 ENCOUNTER — Other Ambulatory Visit: Payer: Self-pay | Admitting: Family Medicine

## 2012-10-13 ENCOUNTER — Telehealth: Payer: Self-pay | Admitting: Family Medicine

## 2012-10-13 DIAGNOSIS — H9190 Unspecified hearing loss, unspecified ear: Secondary | ICD-10-CM

## 2012-10-13 NOTE — Telephone Encounter (Signed)
done

## 2012-10-13 NOTE — Telephone Encounter (Signed)
Patient's son, [name], calling.  Patient is scheduled on 10/21/12, to be seen at [facility] for hearing loss.  They need a faxed referral, will you please enter?

## 2012-10-28 ENCOUNTER — Encounter (HOSPITAL_BASED_OUTPATIENT_CLINIC_OR_DEPARTMENT_OTHER): Payer: Medicare Other | Attending: General Surgery

## 2012-10-28 DIAGNOSIS — L989 Disorder of the skin and subcutaneous tissue, unspecified: Secondary | ICD-10-CM | POA: Insufficient documentation

## 2012-11-25 ENCOUNTER — Encounter (HOSPITAL_BASED_OUTPATIENT_CLINIC_OR_DEPARTMENT_OTHER): Payer: Medicare Other

## 2012-12-04 ENCOUNTER — Other Ambulatory Visit: Payer: Self-pay

## 2012-12-04 MED ORDER — L-METHYLFOLATE-B6-B12 3-35-2 MG PO TABS: 1.0000 | ORAL_TABLET | Freq: Every day | ORAL | Status: DC

## 2013-02-18 ENCOUNTER — Telehealth: Payer: Self-pay | Admitting: Family Medicine

## 2013-02-18 NOTE — Telephone Encounter (Signed)
Last OV 06/2012 with Dr.Hopper (Acute visit)

## 2013-02-18 NOTE — Telephone Encounter (Signed)
Its on her med list----12.5 mg every 3 days

## 2013-02-18 NOTE — Telephone Encounter (Signed)
Please advise on the lower dose and directions       KP

## 2013-02-18 NOTE — Telephone Encounter (Signed)
Ok to refill lower dose ---for 1 month--- needs ov

## 2013-02-18 NOTE — Telephone Encounter (Signed)
Caller: [name]/Child; Phone: [phone]; Reason for Call: Patient needs a refill on her Fentanyl patch 12mg .  Patient has a stronger patch, but does not want to use it at this time due to side effects of increased dosage.  Please call son if you have any questions regarding this refill request.  Preferred pharmacy: [facility], phone: [phone].

## 2013-02-19 MED ORDER — FENTANYL 12 MCG/HR TD PT72: 1.0000 | MEDICATED_PATCH | TRANSDERMAL | Status: DC

## 2013-02-19 NOTE — Telephone Encounter (Signed)
Son made aware that her prescription is ready for pick up and will schedule apt.       KP

## 2013-02-20 ENCOUNTER — Encounter (HOSPITAL_BASED_OUTPATIENT_CLINIC_OR_DEPARTMENT_OTHER): Payer: Medicare Other | Attending: General Surgery

## 2013-02-20 DIAGNOSIS — G309 Alzheimer's disease, unspecified: Secondary | ICD-10-CM | POA: Insufficient documentation

## 2013-02-20 DIAGNOSIS — Z79899 Other long term (current) drug therapy: Secondary | ICD-10-CM | POA: Insufficient documentation

## 2013-02-20 DIAGNOSIS — F0280 Dementia in other diseases classified elsewhere without behavioral disturbance: Secondary | ICD-10-CM | POA: Insufficient documentation

## 2013-02-20 DIAGNOSIS — L97509 Non-pressure chronic ulcer of other part of unspecified foot with unspecified severity: Secondary | ICD-10-CM | POA: Insufficient documentation

## 2013-02-20 DIAGNOSIS — I10 Essential (primary) hypertension: Secondary | ICD-10-CM | POA: Insufficient documentation

## 2013-02-21 NOTE — Progress Notes (Signed)
[facility]  NAME:  Kerry Myers, LEGNER NO.:  [id number]  MEDICAL RECORD NO.:  [id number]      DATE OF BIRTH:  [birth year]  PHYSICIAN:  Ardath Sax, M.D.      VISIT DATE:  02/20/2013                                  OFFICE VISIT   This is a 79 year old lady who is somewhat involved with Alzheimer's who comes back to [place] for treatment of a neuropathic ulcer on the dorsal aspect of her right foot.  This has been a recurrent problem for many years.  She has been taking care of by Dr. Wiliam Ke in the past.  She has a small ulcer it is 1 cm long, 2 mm wide on the dorsal aspect of her right foot.  It looks reasonably clean, and I chose to use Santyl for a week and then will plan on her coming back next week and starting collagen. She was found to have a blood pressure of 140/77, respirations 16, pulse 73, temperature 98.6.  She is a very little lady, only weighs 80 pounds. Her medicines include gabapentin, VESIcare, Requip, hydrocodone for pain, and a fentanyl patch for pain.  She was a little reluctant to have me do any debridement and since it was so small I did not do any debriding today, but we will have her come back.  She is a very familiar patient here for several years.  She is taken care of by her daughter and granddaughter, and they seemed to be very involved with her care. So, she will come back here in a month.  DIAGNOSIS:  Recurring neuropathic ulcer, dorsal right foot.  Other diagnoses, Alzheimer's and hypertension.     Ardath Sax, M.D.     PP/MEDQ  D:  02/20/2013  T:  02/20/2013  Job:  [id number]

## 2013-03-03 ENCOUNTER — Ambulatory Visit (INDEPENDENT_AMBULATORY_CARE_PROVIDER_SITE_OTHER): Payer: Medicare Other | Admitting: Internal Medicine

## 2013-03-03 ENCOUNTER — Encounter: Payer: Self-pay | Admitting: Internal Medicine

## 2013-03-03 ENCOUNTER — Telehealth: Payer: Self-pay | Admitting: Family Medicine

## 2013-03-03 ENCOUNTER — Ambulatory Visit (HOSPITAL_BASED_OUTPATIENT_CLINIC_OR_DEPARTMENT_OTHER)
Admission: RE | Admit: 2013-03-03 | Discharge: 2013-03-03 | Disposition: A | Discharge status: Home / Self Care | Payer: Medicare Other | Source: Ambulatory Visit | Attending: Internal Medicine | Admitting: Internal Medicine

## 2013-03-03 VITALS — BP 122/70 | HR 72 | Temp 97.9°F | Wt 85.0 lb

## 2013-03-03 DIAGNOSIS — R059 Cough, unspecified: Secondary | ICD-10-CM

## 2013-03-03 DIAGNOSIS — R0602 Shortness of breath: Secondary | ICD-10-CM | POA: Insufficient documentation

## 2013-03-03 DIAGNOSIS — R05 Cough: Secondary | ICD-10-CM | POA: Insufficient documentation

## 2013-03-03 MED ORDER — AZITHROMYCIN 250 MG PO TABS: ORAL_TABLET | ORAL | Status: DC

## 2013-03-03 NOTE — Telephone Encounter (Signed)
Seen today. 

## 2013-03-03 NOTE — Telephone Encounter (Signed)
Pt has appt with Dr. Drue Novel today @ 3:45.

## 2013-03-03 NOTE — Progress Notes (Signed)
  Subjective:    Patient ID: Kerry Myers, female    DOB: [birth year], 79 y.o.   MRN: [id number]  HPI Acute visit, here with her son. One-day history of "chest congestion", cough. The son reports that her cough is weak and she is unable to bring up anything. She has home oxygen, chart is reviewed, I don't see the diagnosis of COPD or emphysema, she does have mild pulmonary hypertension.  Past Medical History  Diagnosis Date  . GERD (gastroesophageal reflux disease)   . Bladder incontinence   . Neuropathic pain   . RLS (restless legs syndrome)   . Arthritis   . LBBB (left bundle branch block)    Past Surgical History  Procedure Laterality Date  . Tonsillectomy    . Abdominal hysterectomy      TVH  . Incontinence surgery    . Excision morton's neuroma    . Eye surgery      cataracts  . Amputation  04/30/2012    Procedure: AMPUTATION DIGIT;  Surgeon: Sherri Rad, MD;  Location: [facility];  Service: Orthopedics;  Laterality: Right;  right 2nd toe amptation through MTP joint     Review of Systems No fever chills No chest or shortness of breath Appetite normal.     Objective:   Physical Exam  General -- alert, vital signs stable, no apparent physical distress .   HEENT --  throat w/o redness, face symmetric and not tender to palpation Lungs --  respiratory rate normal, few crackles at the right base?. Few rhonchi bilaterally. No increased work of breathing.  Heart-- normal rate, regular rhythm, mild systolic murmur    Extremities-- no pretibial edema bilaterally Neurologic-- alert & oriented X3 and strength normal in all extremities. Psych--   not anxious appearing and not depressed appearing.      Assessment & Plan:  Cough, One-day history of cough along with chest congestion. O2 sat is not picking up  however does not seem to be in distress, vital signs stable. Plan: Chest x-ray Z-Pak Continue home O2 See instructions

## 2013-03-03 NOTE — Telephone Encounter (Signed)
Patient Information:  Caller Name: Kerry Myers  Phone: [phone]  Patient: Kerry Myers, Kerry Myers  Gender: Female  DOB: 11-Mar-1919  Age: 79 Years  PCP: Lelon Perla.  Office Follow Up:  Does the office need to follow up with this patient?: No  Instructions For The Office: N/A  RN Note:  More short of breath than usual; appears to be working harder to breath. Prefers to have Halsey seen in office due to office is open.  Symptoms  Reason For Call & Symptoms: Emergent Call: Increased cough with chest rattle like something in chest or throat; does not have the strength to cough up the phlem.  Reviewed Health History In EMR: Yes  Reviewed Medications In EMR: Yes  Reviewed Allergies In EMR: Yes  Reviewed Surgeries / Procedures: Yes  Date of Onset of Symptoms: 03/02/2013  Treatments Tried: Hall's mentholated cough drops  Treatments Tried Worked: Yes  Guideline(s) Used:  Cough  Disposition Per Guideline:   Go to ED Now  Reason For Disposition Reached:   Difficulty breathing  Advice Given:  Reassurance  Coughing is the way that our lungs remove irritants and mucus. It helps protect our lungs from getting pneumonia.  Cough Medicines:  Home Remedy - Honey: This old home remedy has been shown to help decrease coughing at night. The adult dosage is 2 teaspoons (10 ml) at bedtime. Honey should not be given to infants under one year of age.  Coughing Spasms:  Drink warm fluids. Inhale warm mist (Reason: both relax the airway and loosen up the phlegm).  Suck on cough drops or hard candy to coat the irritated throat.  Prevent Dehydration:  Drink adequate liquids.  This will help soothe an irritated or dry throat and loosen up the phlegm.  Call Back If:  Difficulty breathing  Fever lasts > 3 days  You become worse.  RN Overrode Recommendation:  Make Appointment  Prefers to be seen in office, if possible.  Appointment Scheduled:  03/03/2013 15:45:00 Appointment Scheduled Provider:  Willow Ora

## 2013-03-03 NOTE — Patient Instructions (Signed)
Get a chest XR at [facility], corner of [address] (10 minutes form here); they are open 24/7 [address]  [place], [place] [address] [phone]  --- Rest, fluids , tylenol For cough, takeRobitussin DM  as needed for cough Take the antibiotic as prescribed  (zithromax) Call if no better in few days Call anytime if the symptoms are severe, you have high fever, short of breath, chest pain ---- Please schedule a routine visit with Dr Laury Axon in 2 weeks

## 2013-03-06 ENCOUNTER — Encounter (HOSPITAL_BASED_OUTPATIENT_CLINIC_OR_DEPARTMENT_OTHER): Payer: Medicare Other | Attending: General Surgery

## 2013-03-06 DIAGNOSIS — L97509 Non-pressure chronic ulcer of other part of unspecified foot with unspecified severity: Secondary | ICD-10-CM | POA: Insufficient documentation

## 2013-03-06 DIAGNOSIS — G589 Mononeuropathy, unspecified: Secondary | ICD-10-CM | POA: Insufficient documentation

## 2013-03-07 ENCOUNTER — Emergency Department (HOSPITAL_COMMUNITY)
Admission: EM | Admit: 2013-03-07 | Discharge: 2013-03-07 | Discharge status: Against Medical Advice | Payer: Medicare Other | Attending: Emergency Medicine | Admitting: Emergency Medicine

## 2013-03-07 ENCOUNTER — Emergency Department (HOSPITAL_COMMUNITY): Payer: Medicare Other

## 2013-03-07 ENCOUNTER — Encounter (HOSPITAL_COMMUNITY): Payer: Self-pay | Admitting: Emergency Medicine

## 2013-03-07 DIAGNOSIS — R059 Cough, unspecified: Secondary | ICD-10-CM | POA: Insufficient documentation

## 2013-03-07 DIAGNOSIS — R05 Cough: Secondary | ICD-10-CM | POA: Insufficient documentation

## 2013-03-07 LAB — BASIC METABOLIC PANEL
Calcium: 9.3 mg/dL (ref 8.4–10.5)
GFR calc Af Amer: >90 mL/min (ref >90)
GFR calc non Af Amer: 82 mL/min — ABNORMAL LOW (ref >90)
Glucose, Bld: 98 mg/dL (ref 70–99)
Potassium: 4.2 mEq/L (ref 3.5–5.1)
Sodium: 141 mEq/L (ref 135–145)

## 2013-03-07 LAB — CBC WITH DIFFERENTIAL/PLATELET
Basophils Absolute: 0 10*3/uL (ref 0.0–0.1)
Basophils Relative: 0 % (ref 0–1)
Eosinophils Absolute: 0.1 10*3/uL (ref 0.0–0.7)
Hemoglobin: 12.8 g/dL (ref 12.0–15.0)
Lymphocytes Relative: 20 % (ref 12–46)
MCH: 31.7 pg (ref 26.0–34.0)
MCHC: 34.3 g/dL (ref 30.0–36.0)
Monocytes Absolute: 0.7 10*3/uL (ref 0.1–1.0)
Neutrophils Relative %: 68 % (ref 43–77)
Platelets: 190 10*3/uL (ref 150–400)
RDW: 13.3 % (ref 11.5–15.5)

## 2013-03-07 LAB — POCT I-STAT TROPONIN I

## 2013-03-07 NOTE — ED Notes (Signed)
Spoke with son in depth, apologized for delay, states he has sent his mother home and they will follow up with another facility.

## 2013-03-07 NOTE — ED Notes (Signed)
Onset 3 days ago shortness of breath and weak non productive cough. Seen primary Doctor chest x-ray taken and given antibiotic.  Not feeling any better and shortness of breath worsening and used oxygen [place] 2.5 liters.

## 2013-03-09 ENCOUNTER — Ambulatory Visit (INDEPENDENT_AMBULATORY_CARE_PROVIDER_SITE_OTHER): Payer: Medicare Other | Admitting: Family Medicine

## 2013-03-09 ENCOUNTER — Telehealth: Payer: Self-pay | Admitting: Family Medicine

## 2013-03-09 ENCOUNTER — Encounter: Payer: Self-pay | Admitting: Family Medicine

## 2013-03-09 VITALS — BP 102/68 | HR 65 | Temp 98.2°F | Wt 81.4 lb

## 2013-03-09 DIAGNOSIS — G894 Chronic pain syndrome: Secondary | ICD-10-CM

## 2013-03-09 DIAGNOSIS — J189 Pneumonia, unspecified organism: Secondary | ICD-10-CM

## 2013-03-09 MED ORDER — AZITHROMYCIN 250 MG PO TABS: ORAL_TABLET | ORAL | Status: DC

## 2013-03-09 MED ORDER — FENTANYL 12 MCG/HR TD PT72: 1.0000 | MEDICATED_PATCH | TRANSDERMAL | Status: DC

## 2013-03-09 NOTE — Telephone Encounter (Signed)
[facility] Triage Call Report Triage Record Num: [id number] Operator: Tomasita Crumble Patient Name: Kerry Myers Call Date & Time: 03/07/2013 4:17:22PM Patient Phone: [phone] PCP: Lelon Perla Patient Gender: Female PCP Fax : [phone] Patient DOB: 12-12-1919 Practice Name: [facility] Reason for Call: Caller: [name]/Other; PCP: Loreen Freud; CB#: [phone]; Call regarding Cough/Congestion; Onset symptoms estimated 03/02/13; seen by Dr. Drue Novel on 03/03/13. Caller relates that his mom c/o feeling very weak and they realized with power off that her oxygen concentrator would not work. Started oxygen at 2.5 liters; she usually has it on just at night with 2 liters ordered. Caller reports breathing does not sound any better than it did before she took the cough syrup or Zithromax - completed 03/07/13. "She sounds congested in lungs. " Caller also relates that patient "felt warmer than usual this am"/ subjective or tactile fever. Caller is poor historian. Contacted provider on call. Dr. Milinda Cave ordered that patient be taken to ED for evaluation. Caller instructed to take patient to [facility] ED. Protocol(s) Used: Cough - Adult Recommended Outcome per Protocol: Call Provider within 8 Hours Reason for Outcome: Being treated by a provider for a secondary infection AND no improvement in symptoms, symptoms have worsened OR has new symptoms after following treatment plan for the time specified by provider. Care Advice: ~ Continue to follow treatment plan, including medications, until evaluated by provider. ~ SYMPTOM / CONDITION MANAGEMENT

## 2013-03-09 NOTE — Telephone Encounter (Signed)
Appointment Scheduled:  03/09/2013 13:30:00  Appointment Scheduled Provider:  Lelon Perla

## 2013-03-09 NOTE — Progress Notes (Signed)
  Subjective:     Kerry Myers is a 79 y.o. female here for evaluation of a cough. Onset of symptoms was 1 week ago. Symptoms have been gradually worsening since that time. The cough is productive and is aggravated by infection. Associated symptoms include: chills, fever, shortness of breath and sputum production. Patient does not have a history of asthma. Patient does not have a history of environmental allergens. Patient has not traveled recently. Patient does not have a history of smoking. Patient has had a previous chest x-ray. Patient has not had a PPD done.  Pt was seen here 1x by Dr Drue Novel and then 2 days later went to ER but left after cxr done but before being seen because they were there for hours.  Developing pneumonia RLL.  The following portions of the patient's history were reviewed and updated as appropriate: allergies, current medications, past family history, past medical history, past social history, past surgical history and problem list.  Review of Systems Pertinent items are noted in HPI.    Objective:    Oxygen saturation 95% on room air BP 102/68  Pulse 65  Temp(Src) 98.2 F (36.8 C) (Oral)  Wt 81 lb 6.4 oz (36.923 kg)  BMI 17.93 kg/m2  SpO2 95% General appearance: alert, cooperative, appears stated age and no distress Ears: normal TM's and external ear canals both ears Nose: Nares normal. Septum midline. Mucosa normal. No drainage or sinus tenderness. Throat: lips, mucosa, and tongue normal; teeth and gums normal Neck: no adenopathy and thyroid not enlarged, symmetric, no tenderness/mass/nodules Lungs: rales RLL Heart: S1, S2 normal    Assessment:    Pneumonia    Plan:    Antibiotics per medication orders. Antitussives per medication orders. Avoid exposure to tobacco smoke and fumes. Call if shortness of breath worsens, blood in sputum, change in character of cough, development of fever or chills, inability to maintain nutrition and hydration. Avoid exposure  to tobacco smoke and fumes. Chest x-ray. f/u 2 weeks or sooner prn

## 2013-03-09 NOTE — Patient Instructions (Signed)

## 2013-03-09 NOTE — Telephone Encounter (Signed)
Patient Information:  Caller Name: [name]  Phone: [phone]  Patient: Kerry Myers, Kerry Myers  Gender: Female  DOB: 09-Jul-1919  Age: 79 Years  PCP: Lelon Perla.  Office Follow Up:  Does the office need to follow up with this patient?: No  Instructions For The Office: N/A   Symptoms  Reason For Call & Symptoms: Son/ [name] states, "Her chest is full of congestion, she is weak and can't breathe real good." Short of breath at times.   She has been on Z-pack.  Seen in ED on 03/07/13 from 18:30 until 22:30 when he decided to leave; tests were done and the ED told him there was no bed available.  She did not receive any Rx there - "not even a drink of water, we had to get that ourself"  Reviewed Health History In EMR: Yes  Reviewed Medications In EMR: Yes  Reviewed Allergies In EMR: Yes  Reviewed Surgeries / Procedures: Yes  Date of Onset of Symptoms: Unknown  Treatments Tried: "Every day medicines", Robitussin DM, Tylenol prn  Treatments Tried Worked: No  Guideline(s) Used:  Cough  Disposition Per Guideline:   See Today or Tomorrow in Office  Reason For Disposition Reached:   Patient wants to be seen  Advice Given:  N/A  RN Overrode Recommendation:  Make Appointment  Caller gives poor history/ infomation. States patient is currently resting/ sleeping with her oxygen on.  Left ED on 03/07/13 before getting test results or any treatment, though he states she had CXR and blood tests.  Appointment Scheduled:  03/09/2013 13:30:00 Appointment Scheduled Provider:  Lelon Perla

## 2013-03-18 ENCOUNTER — Telehealth: Payer: Self-pay | Admitting: Family Medicine

## 2013-03-18 NOTE — Telephone Encounter (Signed)
Spoke with Pt son who states that issue has already been resolved.

## 2013-03-18 NOTE — Telephone Encounter (Signed)
Order is in there

## 2013-03-18 NOTE — Telephone Encounter (Signed)
Patient Information:  Caller Name: [name]  Phone: [phone]  Patient: Kerry Myers, Kerry Myers  Gender: Female  DOB: 1919-11-11  Age: 79 Years  PCP: Lelon Perla.  Office Follow Up:  Does the office need to follow up with this patient?: No  Instructions For The Office: N/A   Symptoms  Reason For Call & Symptoms: Pt's son calling to cancel appt. scheduled with Dr Laury Axon 03/19/13 at 1500.  Appt cancelled.  Pt's son/[name] also states pt was instructed to go to [facility] location to radiology for chest xray 03/09/13.  He reports he took her there and was advised no orders in the system and xray not done at that time.  Orders for chest xray in the system via [facility], spoke with [place] and she states she will call radiology and verify then call pt's sone back to reschedule appt with Dr Laury Axon.  Reviewed Health History In EMR: Yes  Reviewed Medications In EMR: Yes  Reviewed Allergies In EMR: Yes  Reviewed Surgeries / Procedures: Yes  Date of Onset of Symptoms: 03/18/2013  Guideline(s) Used:  No Protocol Available - Information Only  Disposition Per Guideline:   Discuss with PCP and Callback by Nurse Today  Reason For Disposition Reached:   Nursing judgment  Advice Given:  N/A  Patient Will Follow Care Advice:  YES

## 2013-03-19 ENCOUNTER — Ambulatory Visit: Payer: Medicare Other | Admitting: Family Medicine

## 2013-03-20 ENCOUNTER — Ambulatory Visit (INDEPENDENT_AMBULATORY_CARE_PROVIDER_SITE_OTHER)
Admission: RE | Admit: 2013-03-20 | Discharge: 2013-03-20 | Disposition: A | Discharge status: Home / Self Care | Payer: Medicare Other | Source: Ambulatory Visit | Attending: Family Medicine | Admitting: Family Medicine

## 2013-03-20 ENCOUNTER — Telehealth: Payer: Self-pay | Admitting: *Deleted

## 2013-03-20 DIAGNOSIS — J189 Pneumonia, unspecified organism: Secondary | ICD-10-CM

## 2013-03-20 MED ORDER — MOXIFLOXACIN HCL 400 MG PO TABS: 400.0000 mg | ORAL_TABLET | Freq: Every day | ORAL | Status: DC

## 2013-03-20 NOTE — Telephone Encounter (Signed)
Pt POA states that Pt is feeling a little better but is very winded and has a lot of rattling in her chest. Pt son decline to take Pt to ED stated that the only way she will go to the ED is if he calls EMS. Pt son notes that their last experience was awful and the physician did not know what to do and was no help to his mom. Pt son advise again to take Pt to ED if symptom continue to worsen Pt son again states that he will not go to ED but will call EMS if his mom gets bad because if he has to go there again some one is going to jail and it not going to just be him. Rx sent, Pt son aware and will go pick up Rx.

## 2013-03-20 NOTE — Telephone Encounter (Signed)
Message copied by Verdene Rio on Fri Mar 20, 2013  6:17 PM ------      Message from: Lelon Perla      Created: Fri Mar 20, 2013  3:15 PM       How is pt feeling--- if very sob --- she should go to ER      If feeling a little better---she still has pneumonia--- needs avelox 400 mg 1 po qd for 10 days       Ov 7-10 days ------

## 2013-03-21 ENCOUNTER — Telehealth: Payer: Self-pay | Admitting: Family Medicine

## 2013-03-21 MED ORDER — GUAIFENESIN-CODEINE 100-10 MG/5ML PO SYRP: 5.0000 mL | ORAL_SOLUTION | Freq: Two times a day (BID) | ORAL | Status: DC | PRN

## 2013-03-21 NOTE — Telephone Encounter (Signed)
Patients son [name] called in stating that patient was seen on 3/10 for cough and had a chest x-ray yesterday. He is requesting that we call in a prescription for cough medicine to [facility] pharmacy.

## 2013-03-21 NOTE — Telephone Encounter (Signed)
Plz clarify with family - avelox prescription should have been called last night by Dr. Cordella Register nurse.

## 2013-03-21 NOTE — Telephone Encounter (Signed)
Pt's son just wants something for cough, Dr. Reece Agar advise me that he will send in a Rx and I advise pt to only use at night and if she is having a really bad coughing attach, pt's son verbalized understanding

## 2013-03-21 NOTE — Telephone Encounter (Signed)
See prev note, please advise  

## 2013-03-21 NOTE — Telephone Encounter (Signed)
Rx called in as prescribed 

## 2013-03-21 NOTE — Telephone Encounter (Signed)
plz phone in cheratussin for cough.  Discussed mainly use at night.

## 2013-03-23 ENCOUNTER — Encounter (HOSPITAL_BASED_OUTPATIENT_CLINIC_OR_DEPARTMENT_OTHER): Payer: Self-pay | Admitting: Family Medicine

## 2013-03-23 ENCOUNTER — Emergency Department (HOSPITAL_BASED_OUTPATIENT_CLINIC_OR_DEPARTMENT_OTHER): Payer: Medicare Other

## 2013-03-23 ENCOUNTER — Inpatient Hospital Stay (HOSPITAL_BASED_OUTPATIENT_CLINIC_OR_DEPARTMENT_OTHER)
Admission: EM | Admit: 2013-03-23 | Discharge: 2013-03-27 | DRG: 177 | Disposition: A | Discharge status: Home Health | Payer: Medicare Other | Attending: Internal Medicine | Admitting: Internal Medicine

## 2013-03-23 ENCOUNTER — Ambulatory Visit (INDEPENDENT_AMBULATORY_CARE_PROVIDER_SITE_OTHER): Payer: Medicare Other | Admitting: Family Medicine

## 2013-03-23 ENCOUNTER — Encounter: Payer: Self-pay | Admitting: Family Medicine

## 2013-03-23 ENCOUNTER — Telehealth: Payer: Self-pay | Admitting: Family Medicine

## 2013-03-23 VITALS — BP 120/62 | HR 96 | Temp 98.2°F | Wt 86.0 lb

## 2013-03-23 DIAGNOSIS — L97509 Non-pressure chronic ulcer of other part of unspecified foot with unspecified severity: Secondary | ICD-10-CM | POA: Diagnosis present

## 2013-03-23 DIAGNOSIS — R1319 Other dysphagia: Secondary | ICD-10-CM

## 2013-03-23 DIAGNOSIS — L03119 Cellulitis of unspecified part of limb: Secondary | ICD-10-CM

## 2013-03-23 DIAGNOSIS — R51 Headache: Secondary | ICD-10-CM

## 2013-03-23 DIAGNOSIS — R5381 Other malaise: Secondary | ICD-10-CM | POA: Diagnosis present

## 2013-03-23 DIAGNOSIS — R1310 Dysphagia, unspecified: Secondary | ICD-10-CM

## 2013-03-23 DIAGNOSIS — R1312 Dysphagia, oropharyngeal phase: Secondary | ICD-10-CM

## 2013-03-23 DIAGNOSIS — I2789 Other specified pulmonary heart diseases: Secondary | ICD-10-CM

## 2013-03-23 DIAGNOSIS — I2510 Atherosclerotic heart disease of native coronary artery without angina pectoris: Secondary | ICD-10-CM

## 2013-03-23 DIAGNOSIS — R609 Edema, unspecified: Secondary | ICD-10-CM

## 2013-03-23 DIAGNOSIS — M810 Age-related osteoporosis without current pathological fracture: Secondary | ICD-10-CM | POA: Diagnosis present

## 2013-03-23 DIAGNOSIS — J189 Pneumonia, unspecified organism: Secondary | ICD-10-CM

## 2013-03-23 DIAGNOSIS — R531 Weakness: Secondary | ICD-10-CM

## 2013-03-23 DIAGNOSIS — J9620 Acute and chronic respiratory failure, unspecified whether with hypoxia or hypercapnia: Secondary | ICD-10-CM

## 2013-03-23 DIAGNOSIS — G2581 Restless legs syndrome: Secondary | ICD-10-CM | POA: Diagnosis present

## 2013-03-23 DIAGNOSIS — M79609 Pain in unspecified limb: Secondary | ICD-10-CM

## 2013-03-23 DIAGNOSIS — R0902 Hypoxemia: Secondary | ICD-10-CM

## 2013-03-23 DIAGNOSIS — L84 Corns and callosities: Secondary | ICD-10-CM

## 2013-03-23 DIAGNOSIS — M4120 Other idiopathic scoliosis, site unspecified: Secondary | ICD-10-CM

## 2013-03-23 DIAGNOSIS — L02419 Cutaneous abscess of limb, unspecified: Secondary | ICD-10-CM

## 2013-03-23 DIAGNOSIS — N39498 Other specified urinary incontinence: Secondary | ICD-10-CM

## 2013-03-23 DIAGNOSIS — D649 Anemia, unspecified: Secondary | ICD-10-CM | POA: Diagnosis present

## 2013-03-23 DIAGNOSIS — K219 Gastro-esophageal reflux disease without esophagitis: Secondary | ICD-10-CM | POA: Diagnosis present

## 2013-03-23 DIAGNOSIS — R0602 Shortness of breath: Secondary | ICD-10-CM | POA: Insufficient documentation

## 2013-03-23 DIAGNOSIS — M1990 Unspecified osteoarthritis, unspecified site: Secondary | ICD-10-CM

## 2013-03-23 DIAGNOSIS — J690 Pneumonitis due to inhalation of food and vomit: Principal | ICD-10-CM | POA: Insufficient documentation

## 2013-03-23 DIAGNOSIS — R5383 Other fatigue: Secondary | ICD-10-CM | POA: Diagnosis present

## 2013-03-23 DIAGNOSIS — G609 Hereditary and idiopathic neuropathy, unspecified: Secondary | ICD-10-CM

## 2013-03-23 LAB — CBC WITH DIFFERENTIAL/PLATELET
Basophils Absolute: 0 10*3/uL (ref 0.0–0.1)
Basophils Relative: 0 % (ref 0–1)
Eosinophils Relative: 1 % (ref 0–5)
HCT: 31.9 % — ABNORMAL LOW (ref 36.0–46.0)
MCHC: 32.3 g/dL (ref 30.0–36.0)
MCV: 97.9 fL (ref 78.0–100.0)
Monocytes Absolute: 0.9 10*3/uL (ref 0.1–1.0)
Platelets: 207 10*3/uL (ref 150–400)
RDW: 13.5 % (ref 11.5–15.5)

## 2013-03-23 LAB — COMPREHENSIVE METABOLIC PANEL
ALT: 13 U/L (ref 0–35)
AST: 24 U/L (ref 0–37)
Albumin: 2.9 g/dL — ABNORMAL LOW (ref 3.5–5.2)
Calcium: 9.2 mg/dL (ref 8.4–10.5)
Creatinine, Ser: 0.5 mg/dL (ref 0.50–1.10)
Sodium: 144 mEq/L (ref 135–145)
Total Protein: 6.6 g/dL (ref 6.0–8.3)

## 2013-03-23 MED ORDER — FENTANYL 12 MCG/HR TD PT72
12.5000 ug | MEDICATED_PATCH | TRANSDERMAL | Status: DC
  Administered 2013-03-26: 12.5 ug via TRANSDERMAL
  Filled 2013-03-23: qty 1

## 2013-03-23 MED ORDER — ONDANSETRON HCL 4 MG PO TABS: 4.0000 mg | ORAL_TABLET | Freq: Four times a day (QID) | ORAL | Status: DC | PRN

## 2013-03-23 MED ORDER — PIPERACILLIN-TAZOBACTAM 3.375 G IVPB 30 MIN: 3.3750 g | Freq: Three times a day (TID) | INTRAVENOUS | Status: DC

## 2013-03-23 MED ORDER — GABAPENTIN 100 MG PO CAPS
100.0000 mg | ORAL_CAPSULE | Freq: Every day | ORAL | Status: DC
  Administered 2013-03-24 – 2013-03-27 (×4): 100 mg via ORAL
  Filled 2013-03-23 (×4): qty 1

## 2013-03-23 MED ORDER — DOCUSATE SODIUM 100 MG PO CAPS
100.0000 mg | ORAL_CAPSULE | Freq: Two times a day (BID) | ORAL | Status: DC
  Administered 2013-03-23 – 2013-03-27 (×8): 100 mg via ORAL
  Filled 2013-03-23 (×9): qty 1

## 2013-03-23 MED ORDER — PRAMIPEXOLE DIHYDROCHLORIDE 0.25 MG PO TABS
0.5000 mg | ORAL_TABLET | Freq: Every day | ORAL | Status: DC
Start: 2013-03-23 — End: 2013-03-23

## 2013-03-23 MED ORDER — IOHEXOL 350 MG/ML SOLN
80.0000 mL | Freq: Once | INTRAVENOUS | Status: AC | PRN
  Administered 2013-03-23: 80 mL via INTRAVENOUS

## 2013-03-23 MED ORDER — PRAMIPEXOLE DIHYDROCHLORIDE 0.25 MG PO TABS
0.5000 mg | ORAL_TABLET | Freq: Every day | ORAL | Status: DC
  Administered 2013-03-23 – 2013-03-26 (×4): 0.5 mg via ORAL
  Filled 2013-03-23 (×5): qty 2

## 2013-03-23 MED ORDER — GUAIFENESIN-CODEINE 100-10 MG/5ML PO SOLN: 5.0000 mL | ORAL | Status: DC | PRN

## 2013-03-23 MED ORDER — LEVOFLOXACIN 750 MG PO TABS
750.0000 mg | ORAL_TABLET | Freq: Once | ORAL | Status: AC
  Administered 2013-03-23: 750 mg via ORAL
  Filled 2013-03-23: qty 1

## 2013-03-23 MED ORDER — ACETAMINOPHEN 325 MG PO TABS: 650.0000 mg | ORAL_TABLET | Freq: Four times a day (QID) | ORAL | Status: DC | PRN

## 2013-03-23 MED ORDER — SODIUM CHLORIDE 0.9 % IJ SOLN
3.0000 mL | Freq: Two times a day (BID) | INTRAMUSCULAR | Status: DC
Start: 2013-03-23 — End: 2013-03-27
  Administered 2013-03-25 – 2013-03-26 (×3): 3 mL via INTRAVENOUS

## 2013-03-23 MED ORDER — ENOXAPARIN SODIUM 30 MG/0.3ML ~~LOC~~ SOLN
30.0000 mg | SUBCUTANEOUS | Status: DC
  Administered 2013-03-23 – 2013-03-25 (×3): 30 mg via SUBCUTANEOUS
  Filled 2013-03-23 (×5): qty 0.3

## 2013-03-23 MED ORDER — ONDANSETRON HCL 4 MG/2ML IJ SOLN: 4.0000 mg | Freq: Four times a day (QID) | INTRAMUSCULAR | Status: DC | PRN

## 2013-03-23 MED ORDER — DEXTROSE-NACL 5-0.45 % IV SOLN
INTRAVENOUS | Status: DC
  Administered 2013-03-23 – 2013-03-25 (×2): via INTRAVENOUS

## 2013-03-23 MED ORDER — GUAIFENESIN-CODEINE 100-10 MG/5ML PO SYRP: 5.0000 mL | ORAL_SOLUTION | ORAL | Status: DC | PRN

## 2013-03-23 MED ORDER — OMEGA-3-ACID ETHYL ESTERS 1 G PO CAPS
1.0000 g | ORAL_CAPSULE | Freq: Every day | ORAL | Status: DC
  Administered 2013-03-24 – 2013-03-27 (×4): 1 g via ORAL
  Filled 2013-03-23 (×4): qty 1

## 2013-03-23 MED ORDER — SODIUM CHLORIDE 0.9 % IV SOLN
INTRAVENOUS | Status: DC
  Administered 2013-03-23: 17:00:00 via INTRAVENOUS

## 2013-03-23 MED ORDER — ACETAMINOPHEN 650 MG RE SUPP: 650.0000 mg | Freq: Four times a day (QID) | RECTAL | Status: DC | PRN

## 2013-03-23 MED ORDER — IPRATROPIUM BROMIDE 0.02 % IN SOLN
0.5000 mg | Freq: Once | RESPIRATORY_TRACT | Status: AC
  Administered 2013-03-23: 0.5 mg via RESPIRATORY_TRACT
  Filled 2013-03-23: qty 2.5

## 2013-03-23 MED ORDER — DARIFENACIN HYDROBROMIDE ER 15 MG PO TB24
15.0000 mg | ORAL_TABLET | Freq: Every day | ORAL | Status: DC
  Administered 2013-03-24 – 2013-03-27 (×4): 15 mg via ORAL
  Filled 2013-03-23 (×4): qty 1

## 2013-03-23 MED ORDER — ADULT MULTIVITAMIN W/MINERALS CH
1.0000 | ORAL_TABLET | Freq: Every day | ORAL | Status: DC
  Administered 2013-03-24 – 2013-03-27 (×4): 1 via ORAL
  Filled 2013-03-23 (×4): qty 1

## 2013-03-23 MED ORDER — POLYETHYLENE GLYCOL 3350 17 G PO PACK
17.0000 g | PACK | Freq: Every day | ORAL | Status: DC
  Administered 2013-03-24 – 2013-03-26 (×3): 17 g via ORAL
  Filled 2013-03-23 (×4): qty 1

## 2013-03-23 MED ORDER — ALBUTEROL SULFATE (5 MG/ML) 0.5% IN NEBU
2.5000 mg | INHALATION_SOLUTION | Freq: Once | RESPIRATORY_TRACT | Status: AC
  Administered 2013-03-23: 2.5 mg via RESPIRATORY_TRACT
  Filled 2013-03-23: qty 0.5

## 2013-03-23 MED ORDER — LEVOFLOXACIN 500 MG PO TABS
500.0000 mg | ORAL_TABLET | ORAL | Status: DC
  Administered 2013-03-25: 500 mg via ORAL
  Filled 2013-03-23: qty 1

## 2013-03-23 MED ORDER — PIPERACILLIN-TAZOBACTAM 3.375 G IVPB
3.3750 g | Freq: Three times a day (TID) | INTRAVENOUS | Status: DC
  Administered 2013-03-23 – 2013-03-25 (×6): 3.375 g via INTRAVENOUS
  Filled 2013-03-23 (×7): qty 50

## 2013-03-23 MED ORDER — GABAPENTIN 100 MG PO CAPS
200.0000 mg | ORAL_CAPSULE | Freq: Two times a day (BID) | ORAL | Status: DC
  Administered 2013-03-23 – 2013-03-27 (×8): 200 mg via ORAL
  Filled 2013-03-23 (×9): qty 2

## 2013-03-23 MED ORDER — PANTOPRAZOLE SODIUM 40 MG PO TBEC
80.0000 mg | DELAYED_RELEASE_TABLET | Freq: Every day | ORAL | Status: DC
  Administered 2013-03-23 – 2013-03-27 (×5): 80 mg via ORAL
  Filled 2013-03-23 (×5): qty 2

## 2013-03-23 MED ORDER — ALBUTEROL SULFATE (5 MG/ML) 0.5% IN NEBU
2.5000 mg | INHALATION_SOLUTION | Freq: Four times a day (QID) | RESPIRATORY_TRACT | Status: DC
  Administered 2013-03-23 – 2013-03-24 (×4): 2.5 mg via RESPIRATORY_TRACT
  Filled 2013-03-23 (×4): qty 0.5

## 2013-03-23 MED ORDER — ONDANSETRON HCL 4 MG/2ML IJ SOLN: 4.0000 mg | Freq: Three times a day (TID) | INTRAMUSCULAR | Status: AC | PRN

## 2013-03-23 MED ORDER — CALCIUM CARBONATE 1250 (500 CA) MG PO TABS
1250.0000 mg | ORAL_TABLET | Freq: Every day | ORAL | Status: DC
  Administered 2013-03-23 – 2013-03-27 (×5): 1250 mg via ORAL
  Filled 2013-03-23 (×5): qty 1

## 2013-03-23 MED ORDER — TRAMADOL HCL 50 MG PO TABS
50.0000 mg | ORAL_TABLET | Freq: Four times a day (QID) | ORAL | Status: DC | PRN
  Administered 2013-03-25 – 2013-03-26 (×2): 50 mg via ORAL
  Filled 2013-03-23 (×2): qty 1

## 2013-03-23 MED ORDER — POLYETHYLENE GLYCOL 3350 17 GM/SCOOP PO POWD
17.0000 g | Freq: Every morning | ORAL | Status: DC
  Filled 2013-03-23: qty 255

## 2013-03-23 NOTE — ED Notes (Signed)
MD at bedside. 

## 2013-03-23 NOTE — ED Notes (Signed)
Family aware of Plan for the Pt. To be admitted.

## 2013-03-23 NOTE — ED Provider Notes (Signed)
History     CSN: [id number]  Arrival date & time 03/23/13  1010   First MD Initiated Contact with Patient 03/23/13 1120      Chief Complaint  Patient presents with  . Cough    (Consider location/radiation/quality/duration/timing/severity/associated sxs/prior treatment) Patient is a 79 y.o. female presenting with cough. The history is provided by the patient and the spouse.  Cough She has been sick for the last 3 weeks with nonproductive cough. She's also complaining of dyspnea. She denies chest pain. There've been no fever, chills, sweats. She has been generally weaker than normal. She is intubated with 2 courses of azithromycin and one of moxifloxacin with no improvement. She went to see her PCP today and they were unable to get a pulse oximetry reading and send her to the ED. Family relates that she normally uses her oxygen generator only at night but for this. She has been using it all day long. She denies chest pain. Cough medications have not been effective.  Past Medical History  Diagnosis Date  . GERD (gastroesophageal reflux disease)   . Bladder incontinence   . Neuropathic pain   . RLS (restless legs syndrome)   . Arthritis   . LBBB (left bundle branch block)     Past Surgical History  Procedure Laterality Date  . Tonsillectomy    . Abdominal hysterectomy      TVH  . Incontinence surgery    . Excision morton's neuroma    . Eye surgery      cataracts  . Amputation  04/30/2012    Procedure: AMPUTATION DIGIT;  Surgeon: Sherri Rad, MD;  Location: [facility];  Service: Orthopedics;  Laterality: Right;  right 2nd toe amptation through MTP joint    No family history on file.  History  Substance Use Topics  . Smoking status: Never Smoker   . Smokeless tobacco: Not on file  . Alcohol Use: No    OB History   Grav Para Term Preterm Abortions TAB SAB Ect Mult Living                  Review of Systems  Respiratory: Positive for cough.   All  other systems reviewed and are negative.    Allergies  Review of patient's allergies indicates no known allergies.  Home Medications   Current Outpatient Rx  Name  Route  Sig  Dispense  Refill  . Ascorbic Acid (VITAMIN C) 1000 MG tablet   Oral   Take 1,000 mg by mouth daily.         Marland Kitchen azithromycin (ZITHROMAX) 250 MG tablet      As directed   6 tablet   0   . calcium carbonate (OS-CAL) 600 MG TABS   Oral   Take 600 mg by mouth daily.         Jennette Banker Sodium 30-100 MG CAPS   Oral   Take by mouth.         . Cholecalciferol (VITAMIN D3) 5000 UNITS CAPS   Oral   Take 1 capsule by mouth daily.         . fentaNYL (DURAGESIC - DOSED MCG/HR) 12 MCG/HR   Transdermal   Place 1 patch (12.5 mcg total) onto the skin every 3 (three) days.   5 patch   0   . fish oil-omega-3 fatty acids 1000 MG capsule   Oral   Take 1 g by mouth daily.         Marland Kitchen  gabapentin (NEURONTIN) 100 MG capsule   Oral   Take 100 mg by mouth 3 (three) times daily. 2 cap in the morning, 1 at noon and 2 at dinner--Dr Love         . guaiFENesin-codeine (ROBITUSSIN AC) 100-10 MG/5ML syrup   Oral   Take 5 mLs by mouth 2 (two) times daily as needed for cough.   180 mL   0   . l-methylfolate-B6-B12 (METANX) 3-35-2 MG TABS   Oral   Take 2 tablets by mouth daily.         Marland Kitchen moxifloxacin (AVELOX) 400 MG tablet   Oral   Take 1 tablet (400 mg total) by mouth daily.   10 tablet   0   . Multiple Vitamin (MULTIVITAMIN WITH MINERALS) TABS   Oral   Take 1 tablet by mouth daily.         Marland Kitchen omeprazole (PRILOSEC) 40 MG capsule   Oral   Take 1 capsule (40 mg total) by mouth daily.   90 capsule   1   . polyethylene glycol powder (GLYCOLAX/MIRALAX) powder   Oral   Take 17 g by mouth daily.         . pramipexole (MIRAPEX) 0.5 MG tablet   Oral   Take 1 tablet (0.5 mg total) by mouth 2 (two) times daily.   60 tablet   5   . solifenacin (VESICARE) 10 MG tablet   Oral   Take  5 mg by mouth 2 (two) times daily. Marland Kitchen5 in the morning and .5 in the evening         . traMADol (ULTRAM) 50 MG tablet   Oral   Take 50 mg by mouth every 6 (six) hours as needed for pain.           BP 127/86  Pulse 70  Temp(Src) 98.4 F (36.9 C) (Oral)  Resp 20  Ht 4\' 6"  (1.372 m)  Wt 81 lb (36.741 kg)  BMI 19.52 kg/m2  SpO2 97%  Physical Exam  Nursing note and vitals reviewed.  79 year old female, resting comfortably and in no acute distress. Vital signs are normal. Oxygen saturation is 78%, which is hypoxic but increased to 98% when placed on nasal oxygen. Head is normocephalic and atraumatic. PERRLA, EOMI. Oropharynx is clear. Neck is nontender and supple without adenopathy or JVD. Back is nontender and there is no CVA tenderness. Lungs have distant breath sounds with very poor air movement throughout. Chest is nontender. Heart has regular rate and rhythm without murmur. Abdomen is soft, flat, nontender without masses or hepatosplenomegaly and peristalsis is normoactive. Extremities have no cyanosis or edema, full range of motion is present. Skin is warm and dry without rash. Neurologic: Mental status is normal, cranial nerves are intact, there are no motor or sensory deficits.  ED Course  Procedures (including critical care time)  Results for orders placed during the hospital encounter of 03/23/13  CBC WITH DIFFERENTIAL      Result Value Range   WBC 6.5  4.0 - 10.5 K/uL   RBC 3.26 (*) 3.87 - 5.11 MIL/uL   Hemoglobin 10.3 (*) 12.0 - 15.0 g/dL   HCT 16.1 (*) 09.6 - 04.5 %   MCV 97.9  78.0 - 100.0 fL   MCH 31.6  26.0 - 34.0 pg   MCHC 32.3  30.0 - 36.0 g/dL   RDW 40.9  81.1 - 91.4 %   Platelets 207  150 - 400 K/uL  Neutrophils Relative 64  43 - 77 %   Neutro Abs 4.2  1.7 - 7.7 K/uL   Lymphocytes Relative 21  12 - 46 %   Lymphs Abs 1.3  0.7 - 4.0 K/uL   Monocytes Relative 14 (*) 3 - 12 %   Monocytes Absolute 0.9  0.1 - 1.0 K/uL   Eosinophils Relative 1  0 - 5 %    Eosinophils Absolute 0.1  0.0 - 0.7 K/uL   Basophils Relative 0  0 - 1 %   Basophils Absolute 0.0  0.0 - 0.1 K/uL  COMPREHENSIVE METABOLIC PANEL      Result Value Range   Sodium 144  135 - 145 mEq/L   Potassium 3.9  3.5 - 5.1 mEq/L   Chloride 105  96 - 112 mEq/L   CO2 31  19 - 32 mEq/L   Glucose, Bld 83  70 - 99 mg/dL   BUN 20  6 - 23 mg/dL   Creatinine, Ser 9.14  0.50 - 1.10 mg/dL   Calcium 9.2  8.4 - 78.2 mg/dL   Total Protein 6.6  6.0 - 8.3 g/dL   Albumin 2.9 (*) 3.5 - 5.2 g/dL   AST 24  0 - 37 U/L   ALT 13  0 - 35 U/L   Alkaline Phosphatase 60  39 - 117 U/L   Total Bilirubin 0.3  0.3 - 1.2 mg/dL   GFR calc non Af Amer 82 (*) >90 mL/min   GFR calc Af Amer >90  >90 mL/min  PRO B NATRIURETIC PEPTIDE      Result Value Range   Pro B Natriuretic peptide (BNP) 169.4  0 - 450 pg/mL   Dg Chest 2 View  03/20/2013  *RADIOLOGY REPORT*  Clinical Data: Pneumonia.  CHEST - 2 VIEW  Comparison: 03/07/2013.  Findings: The heart is borderline enlarged but stable.  The mediastinal and hilar contours are stable.  A hiatal hernia is again demonstrated.  There is persistent density right lung base which could reflect infiltrate.  IMPRESSION: Persistent right basilar density could reflect an infiltrate.   Original Report Authenticated By: Rudie Meyer, M.D.    Dg Chest 2 View  03/07/2013  *RADIOLOGY REPORT*  Clinical Data: Short of breath, cough  CHEST - 2 VIEW  Comparison: Prior chest x-ray 03/03/2013  Findings: Slightly increased patchy opacity in the right base. Otherwise, similar appearance of hyperexpansion and central bronchitic changes consistent with underlying COPD.  Moderate sized hiatal hernia.  Cardiac and mediastinal contours remain within normal limits.  The bones are diffusely osteopenic.  There is exaggerated thoracic kyphosis with probable multiple compression fractures.  No acute osseous abnormality identified.  IMPRESSION:  Developing patchy opacity in the right lower lobe may represent  infiltrate, or possibly a small amount of aspiration.  Background changes of COPD.  Moderate hiatal hernia.   Original Report Authenticated By: Malachy Moan, M.D.    Dg Chest 2 View  03/03/2013  *RADIOLOGY REPORT*  Clinical Data: History of coughing.  Shortness of breath.  CHEST - 2 VIEW  Comparison: 02/23/2009.  Findings: There is borderline cardiac silhouette enlargement. Ectasia, calcification, and tortuosity of the descending aorta is seen.  There is scoliosis convexity to the left with slight rotation of the mediastinum.  Left hilar prominence most likely is vascular.  There is a moderately large hiatal hernia in the retrocardiac region.  There is osteopenic appearance of the bones. Changes of degenerative disc disease and degenerative spondylosis are seen.  There is  slight loss of height involving the upper mid thoracic vertebral body.  No pleural effusion is seen.  IMPRESSION: Borderline cardiac silhouette enlargement.  Scoliosis distorts the mediastinal structures.  The prominent left hilum is probably stable and is probably vascular.  Moderately large hiatal hernia. No pulmonary edema, consolidation, or pleural effusion.  Chronic bony abnormalities are detailed above.   Original Report Authenticated By: Onalee Hua Call    Ct Angio Chest W/cm &/or Wo Cm  03/23/2013  *RADIOLOGY REPORT*  Clinical Data: Cough for 3 weeks, history of pulmonary hypertension  CT ANGIOGRAPHY CHEST  Technique:  Multidetector CT imaging of the chest using the standard protocol during bolus administration of intravenous contrast. Multiplanar reconstructed images including MIPs were obtained and reviewed to evaluate the vascular anatomy.  Contrast: 80mL OMNIPAQUE IOHEXOL 350 MG/ML SOLN  Comparison: None.  Findings: Scattered atherosclerotic calcifications aorta and coronary arteries. No aortic aneurysm or dissection. Pulmonary arteries well opacified and patent. No evidence of pulmonary embolism. Large hiatal hernia. Elevation of  right diaphragm with right basilar atelectasis. Mild patchy infiltrate in right lower lobe and right middle lobe. Tiny bibasilar pleural effusions. No pneumothorax. Visualized portion upper abdomen unremarkable.  IMPRESSION: No evidence of pulmonary embolism. Right basilar atelectasis with mild patchy infiltrate in right middle and lower lobes. Tiny bibasilar pleural effusions. Large hiatal hernia.   Original Report Authenticated By: Ulyses Southward, M.D.       Date: 03/23/2013  Rate: 60  Rhythm: normal sinus rhythm  QRS Axis: left  Intervals: normal  ST/T Wave abnormalities: normal  Conduction Disutrbances:left anterior fascicular block  Narrative Interpretation: Left anterior fascicular block. When compared with ECG of 03/07/2013, no significant changes are seen although significant baseline artifact was present today.  Old EKG Reviewed: unchanged    1. Community acquired pneumonia   2. Hypoxia       MDM  Cough with dyspnea and weakness which has not responded to 3 courses of antibiotics. She has had several chest x-rays which show a questionable right lower lobe infiltrate. I am concerned that there is another behind her symptoms. She will be screened for congestive heart failure with a BNP and she'll be sent for a CT angiogram of her chest to rule out pulmonary embolism. She will also be given his therapeutic trial of albuterol with Atrovent. Old records were reviewed and recent office visits have been as family has described above.  Chest CT shows persistent pneumonia but no evidence of pulmonary embolism. BNP is normal. She needs to be admitted as a failure of outpatient management of community-acquired pneumonia. Case is discussed with Dr. Elisabeth Pigeon of [facility] who agrees to admit the patient.      Dione Booze, MD 03/23/13 [phone]

## 2013-03-23 NOTE — Progress Notes (Signed)
  Subjective:    Patient ID: Kerry Myers, female    DOB: 08-25-1919, 79 y.o.   MRN: [id number]  HPI Pt here with son and daughter in law.  She had worsening sob over week last week and avelox was added Friday because family refused to bring her to ER.  The feel like she is slightly better today but has used O2 all day at home over weekend.   We had trouble getting accurate Pulse ox --- and with worsening symptoms --- and ? Pneumonia on cxr --pt sent to Er for further evaluation.     Review of Systems As above    Objective:   Physical Exam  BP 120/62  Pulse 96  Temp(Src) 98.2 F (36.8 C) (Oral)  Wt 86 lb (39.009 kg)  BMI 18.94 kg/m2  SpO2 78% General appearance: alert, cooperative and mild distress Lungs: diminished breath sounds bilaterally       Assessment & Plan:

## 2013-03-23 NOTE — Telephone Encounter (Signed)
[facility] Triage Call Report Triage Record Num: [id number] Operator: Levora Angel Patient Name: Margel Joens Call Date & Time: 03/20/2013 5:52:45PM Patient Phone: [phone] PCP: Lelon Perla Patient Gender: Female PCP Fax : [phone] Patient DOB: 1919-11-13 Practice Name: [facility] Reason for Call: Caller: [name]/Other; PCP: Lelon Perla.; CB#: [phone]; Call regarding Cough; Afebrile. Onset: 03/09/13 patient was seen in the office and placed on Z-Pak. 03/20/13 Son/Caregiver is calling due to feedback from Dr. Jimmey Ralph at the wound clinic. Patient was seen today and Dr. Jimmey Ralph listened to patients lungs and informed patient caregiver that she needed more than a Z-Pak for treatment. Patient is not strong enough to cough up anythng productive. Patient has oxygen and is set at 2.5 liters via nasal cannula. States cough is worse. Patient completed Z-Pak on 03/14/13. Urgent symptom of "Being treated by a provider for a secondary infection and no improvement in symtpoms, symptoms have worsened or has new symptoms after following treatment plan for the time spedified by provider" per Cough Adult protocol. Disposition call Provider within 8 hours. Notified Dr. Sharen Hones and informed of update and that Dr. Jimmey Ralph per patient was willing to treat however wanted patient to check with office first. Dr. Sharen Hones stated that if Dr. Jimmey Ralph was willing to treat since he listened to patients lungs then that would be the best avenue if now have patient come into the clinic in the morning for evaluation and treatment. Son states understanding and will call Dr. Jimmey Ralph if unable to treat will call back for follow up evaluation in the morning. Protocol(s) Used: Cough - Adult Recommended Outcome per Protocol: Call Provider within 8 Hours Reason for Outcome: Being treated by a provider for a secondary infection AND no improvement in symptoms, symptoms have worsened OR has new  symptoms after following treatment plan for the time specified by provider. Care Advice: ~ Continue to follow treatment plan, including medications, until evaluated by provider. ~ SYMPTOM / CONDITION MANAGEMENT 03/20/2013 6:19:45PM Page 1 of 1 [email or web address]

## 2013-03-23 NOTE — Progress Notes (Signed)
Patient transferred from [facility] for worsening shortness of breath/. Patient was treated with antibiotics outpatient but with little symptomatic improvement. Patient will be transferred to [facility] for acute COPD exacerbation and/or pneumonia.  [facility] [phone] or [phone]

## 2013-03-23 NOTE — Assessment & Plan Note (Signed)
Pt sent to ER for further evaluation since she is not improving with abx and the difficulty we had gettting pulse ox

## 2013-03-23 NOTE — ED Notes (Signed)
Pt. Report given to RN at [facility] long by [employer]

## 2013-03-23 NOTE — Telephone Encounter (Signed)
Appointment scheduled for this morning  [facility] Triage Call Report Triage Record Num: [id number] Operator: April Finney Patient Name: Kerry Myers Call Date & Time: 03/22/2013 12:36:01PM Patient Phone: [phone] PCP: Lelon Perla Patient Gender: Female PCP Fax : [phone] Patient DOB: [birth year] Practice Name: [facility] Reason for Call: Caller: [name]/Other; PCP: Lelon Perla.; CB#: [phone]; Call regarding needing an appt for tomorrow and declines triage. Appt scheduled for 9am on 03/23/2013. Protocol(s) Used: Office Note Recommended Outcome per Protocol: Information Noted and Sent to Office Reason for Outcome: Caller information to office

## 2013-03-23 NOTE — H&P (Signed)
[facility] History and Physical  MAYSON STERBENZ WNU:[id number] DOB: 05/22/1919 DOA: 03/23/2013   PCP: Loreen Freud, DO   Chief Complaint: Coughing and shortness of breath  HPI:  79 year old female with history of GERD, restless leg syndrome, peripheral neuropathy, and bladder incontinence presents with a three-week history of cough, congestion, and shortness of breath. The patient was initially given azithromycin on 03/03/2013 and again on 03/09/2013 when there was no significant improvement. When her symptoms began, the patient had a fever of 100.87F. Since that time, the patient has not had any significant fevers. The patient wears supplemental oxygen 2 L at nighttime, but in the past 2-1/2 weeks, she has had to wear supplemental oxygen 24/7 because of her coughing and shortness of breath. There was no pre-existing diagnosis COPD, but the patient was exposed to significant amount of secondhand smoke from her late husband for at least 30 years. Chest x-ray was initially obtained on 03/07/2013 showed developing right lower lobe opacity. Repeat chest x-ray on 03/20/2013 showed mild persistent right lower lobe opacity. Both of these x-rays suggested hyperinflation. The patient's son called the primary care physician on Friday, 03/20/2013 with continued coughing and shortness of breath. The patient was given a prescription for moxifloxacin which she began taking that night through this morning. Her symptoms did not improve.  These past 3 weeks, the patient has not had any significant fever except for the first day or 2. The biggest complaint is that of unrelenting coughing resulting in shortness of breath. The patient does not have any dyspnea at rest, PND, orthopnea, worsening peripheral edema. There has been no hemoptysis, vomiting, diarrhea, abdominal pain, dysuria, hematuria, syncope. The patient has tried Robitussin and Mucinex D. for her cough which has not helped. She does not use any inhalers.  The patient went to see her primary care provider today for the above symptoms. She was noted to have a pulse ox reading of 78% in the office. As a result she was sent to the emergency department at [facility] point.  Now the pt is a direct admit from there.  Blood work reveals WBC 6.5, hemoglobin 10.3, proBNP 169, BUN 20, creatinine 0.50, sodium 144. CT angiogram chest negative for pulmonary embolus but shows right lower lobe patchy infiltrate. Assessment/Plan: Right lower lobe infiltrate/pneumonia -Clinically, it does not appear that the patient has a multi-drug-resistant organism or overwhelming infection as the patient does not appear clinically toxic and she is hemodynamically stable -Therefore my overall suspicion of a drug-resistant organisms is low as the patient has no fever and no significant leukocytosis -Nevertheless, I will start the patient on broad-spectrum antibiotics--Zosyn and levofloxacin until her clinical situation is clarified -Blood cultures x2 sets, urine Legionella antigen, urine Streptococcus pneumoniae antigen -It appears that the patient's overwhelming complaint is that of a cough which may very well be due to airway hyperreactivity versus underlying COPD/cough variant asthma -Start the patient on aerosolized albuterol -May consider short course of steroids if no improvement generalized weakness -Check UA and urine culture -Check TSH -Likely due to deconditioning Gastroesophageal reflux disease  -Continue proton pump inhibitor  Peripheral neuropathy  -Continue gabapentin, fentanyl patch, tramadol  CODE STATUS -Full code, verified with the patient's son       Past Medical History  Diagnosis Date  . GERD (gastroesophageal reflux disease)   . Bladder incontinence   . Neuropathic pain   . RLS (restless legs syndrome)   . Arthritis   . LBBB (left bundle branch block)  Past Surgical History  Procedure Laterality Date  . Tonsillectomy    . Abdominal  hysterectomy      TVH  . Incontinence surgery    . Excision morton's neuroma    . Eye surgery      cataracts  . Amputation  04/30/2012    Procedure: AMPUTATION DIGIT;  Surgeon: Sherri Rad, MD;  Location: [facility];  Service: Orthopedics;  Laterality: Right;  right 2nd toe amptation through MTP joint   Social History:  reports that she has never smoked. She has never used smokeless tobacco. She reports that she does not drink alcohol. Her drug history is not on file.   History reviewed. No pertinent family history.   No Known Allergies    Prior to Admission medications   Medication Sig Start Date End Date Taking? Authorizing Provider  Ascorbic Acid (VITAMIN C) 1000 MG tablet Take 1,000 mg by mouth daily.   Yes Historical Provider, MD  calcium carbonate (OS-CAL) 600 MG TABS Take 600 mg by mouth daily.   Yes Historical Provider, MD  Cholecalciferol (VITAMIN D3) 5000 UNITS CAPS Take 1 capsule by mouth daily.   Yes Historical Provider, MD  Docusate Calcium (STOOL SOFTENER PO) Take 2 tablets by mouth 2 (two) times daily.   Yes Historical Provider, MD  fentaNYL (DURAGESIC - DOSED MCG/HR) 12 MCG/HR Place 1 patch onto the skin every 3 (three) days.   Yes Historical Provider, MD  gabapentin (NEURONTIN) 100 MG capsule Take 100-200 mg by mouth 3 (three) times daily. Takes 200mg  every morning, 100mg  every day at noon, and 200mg  every night.   Yes Historical Provider, MD  guaiFENesin-codeine (ROBITUSSIN AC) 100-10 MG/5ML syrup Take 5 mLs by mouth 2 (two) times daily as needed for cough.   Yes Historical Provider, MD  l-methylfolate-B6-B12 (METANX) 3-35-2 MG TABS Take 1 tablet by mouth 2 (two) times daily.   Yes Historical Provider, MD  moxifloxacin (AVELOX) 400 MG tablet Take 400 mg by mouth every morning. First dose 03/21/2013.  Takes for 10 days.   Yes Historical Provider, MD  Multiple Vitamin (MULTIVITAMIN WITH MINERALS) TABS Take 1 tablet by mouth daily.   Yes Historical  Provider, MD  omega-3 acid ethyl esters (LOVAZA) 1 G capsule Take 1 g by mouth daily at 12 noon.   Yes Historical Provider, MD  omeprazole (PRILOSEC) 40 MG capsule Take 40 mg by mouth at bedtime.   Yes Historical Provider, MD  polyethylene glycol powder (GLYCOLAX/MIRALAX) powder Take 17 g by mouth every morning.    Yes Historical Provider, MD  pramipexole (MIRAPEX) 0.5 MG tablet Take 0.5 mg by mouth at bedtime.   Yes Historical Provider, MD  solifenacin (VESICARE) 10 MG tablet Take 5 mg by mouth 2 (two) times daily.   Yes Historical Provider, MD  traMADol (ULTRAM) 50 MG tablet Take 50 mg by mouth 2 (two) times daily as needed for pain.    Yes Historical Provider, MD  UNABLE TO FIND Apply 1 application topically daily. Med Name: Silvercel Non-Adherent Antimicrobial Aliginate Dressing 2in x 2in.  Pack into wound daily and MUST remain dry.   Yes Historical Provider, MD    Review of Systems:  Constitutional:  No weight loss, night sweats,  Head&Eyes: No headache.  No vision loss.  No eye pain or scotoma ENT:  No Difficulty swallowing,Tooth/dental problems,Sore throat,   Cardio-vascular:  No chest pain, Orthopnea, PND, swelling in lower extremities,  dizziness, palpitations  GI:  No  abdominal pain, nausea, vomiting,  diarrhea, loss of appetite, hematochezia, melena, heartburn, indigestion, Resp:   No coughing up of blood .No wheezing.No chest wall deformity  Skin:  no rash or lesions.  GU:  no dysuria, change in color of urine, no urgency or frequency. No flank pain.  Musculoskeletal:  Chronic back and leg pains Psych:  No change in mood or affect. No depression or anxiety. Neurologic: No headache, no dysesthesia, no focal weakness, no vision loss. No syncope  Physical Exam: Filed Vitals:   03/23/13 1024 03/23/13 1143 03/23/13 1410 03/23/13 1615  BP:   135/72 130/79  Pulse:   67 70  Temp:   97.9 F (36.6 C) 98 F (36.7 C)  TempSrc:   Oral Oral  Resp:   16 18  Height:       Weight:      SpO2: 97% 100% 100% 94%   General:  A&O x 3, NAD, nontoxic, pleasant/cooperative Head/Eye: No conjunctival hemorrhage, no icterus, [place]/AT, No nystagmus ENT:  No icterus,  No thrush, edentulous, no pharyngeal exudate Neck:  No masses, no lymphadenpathy, no bruits CV:  RRR, no rub, no gallop, no S3 Lung:  Diminished breath sounds right base. Bibasilar crackles right greater than left. No wheezes. Good air movement. Abdomen: soft/NT, +BS, nondistended, no peritoneal signs Ext: No cyanosis, No rashes, No petechiae, No lymphangitis, No edema   Labs on Admission:  Basic Metabolic Panel:  Recent Labs Lab 03/23/13 1150  NA 144  K 3.9  CL 105  CO2 31  GLUCOSE 83  BUN 20  CREATININE 0.50  CALCIUM 9.2   Liver Function Tests:  Recent Labs Lab 03/23/13 1150  AST 24  ALT 13  ALKPHOS 60  BILITOT 0.3  PROT 6.6  ALBUMIN 2.9*   No results found for this basename: LIPASE, AMYLASE,  in the last 168 hours No results found for this basename: AMMONIA,  in the last 168 hours CBC:  Recent Labs Lab 03/23/13 1150  WBC 6.5  NEUTROABS 4.2  HGB 10.3*  HCT 31.9*  MCV 97.9  PLT 207   Cardiac Enzymes: No results found for this basename: CKTOTAL, CKMB, CKMBINDEX, TROPONINI,  in the last 168 hours BNP: No components found with this basename: POCBNP,  CBG: No results found for this basename: GLUCAP,  in the last 168 hours  Radiological Exams on Admission: Ct Angio Chest W/cm &/or Wo Cm  03/23/2013  *RADIOLOGY REPORT*  Clinical Data: Cough for 3 weeks, history of pulmonary hypertension  CT ANGIOGRAPHY CHEST  Technique:  Multidetector CT imaging of the chest using the standard protocol during bolus administration of intravenous contrast. Multiplanar reconstructed images including MIPs were obtained and reviewed to evaluate the vascular anatomy.  Contrast: 80mL OMNIPAQUE IOHEXOL 350 MG/ML SOLN  Comparison: None.  Findings: Scattered atherosclerotic calcifications aorta and  coronary arteries. No aortic aneurysm or dissection. Pulmonary arteries well opacified and patent. No evidence of pulmonary embolism. Large hiatal hernia. Elevation of right diaphragm with right basilar atelectasis. Mild patchy infiltrate in right lower lobe and right middle lobe. Tiny bibasilar pleural effusions. No pneumothorax. Visualized portion upper abdomen unremarkable.  IMPRESSION: No evidence of pulmonary embolism. Right basilar atelectasis with mild patchy infiltrate in right middle and lower lobes. Tiny bibasilar pleural effusions. Large hiatal hernia.   Original Report Authenticated By: Ulyses Southward, M.D.         Time spent:70 minutes Code Status:   FULL Family Communication:   Spoke with son on telephone   Meeah Totino, DO  [facility]  Pager [phone]  If 7PM-7AM, please contact night-coverage [email or web address] Password [facility] 03/23/2013, 6:02 PM

## 2013-03-23 NOTE — ED Notes (Addendum)
Pt sent here from Dr. Laury Axon office for low oxygen sats. Pt alert and talking, denies pain, O2 98%, cough present. Pt denies feeling short of breath, pt on 2.5L [place] at home.  Pt caregiver sts pt was diagnosed with pneumonia and is taking abx for same.

## 2013-03-24 DIAGNOSIS — G2581 Restless legs syndrome: Secondary | ICD-10-CM

## 2013-03-24 LAB — BASIC METABOLIC PANEL
BUN: 14 mg/dL (ref 6–23)
Calcium: 8.4 mg/dL (ref 8.4–10.5)
GFR calc non Af Amer: 83 mL/min — ABNORMAL LOW (ref >90)
Glucose, Bld: 109 mg/dL — ABNORMAL HIGH (ref 70–99)
Potassium: 3.7 mEq/L (ref 3.5–5.1)

## 2013-03-24 LAB — CBC
Hemoglobin: 9.5 g/dL — ABNORMAL LOW (ref 12.0–15.0)
MCH: 31.8 pg (ref 26.0–34.0)
MCHC: 33 g/dL (ref 30.0–36.0)
MCV: 96.3 fL (ref 78.0–100.0)

## 2013-03-24 LAB — PREALBUMIN: Prealbumin: 13.4 mg/dL — ABNORMAL LOW (ref 17.0–34.0)

## 2013-03-24 LAB — TSH: TSH: 0.866 u[IU]/mL (ref 0.350–4.500)

## 2013-03-24 MED ORDER — ALBUTEROL SULFATE (5 MG/ML) 0.5% IN NEBU
2.5000 mg | INHALATION_SOLUTION | RESPIRATORY_TRACT | Status: DC | PRN
Start: 2013-03-24 — End: 2013-03-27
  Administered 2013-03-25: 2.5 mg via RESPIRATORY_TRACT
  Filled 2013-03-24: qty 0.5

## 2013-03-24 MED ORDER — ALBUTEROL SULFATE (5 MG/ML) 0.5% IN NEBU
2.5000 mg | INHALATION_SOLUTION | Freq: Three times a day (TID) | RESPIRATORY_TRACT | Status: DC
  Administered 2013-03-24 – 2013-03-27 (×7): 2.5 mg via RESPIRATORY_TRACT
  Filled 2013-03-24 (×8): qty 0.5

## 2013-03-24 MED ORDER — BOOST / RESOURCE BREEZE PO LIQD
1.0000 | Freq: Two times a day (BID) | ORAL | Status: DC
Start: 2013-03-24 — End: 2013-03-27
  Administered 2013-03-25 – 2013-03-27 (×4): 1 via ORAL

## 2013-03-24 NOTE — Progress Notes (Signed)
[facility] PROGRESS NOTE  VANIYA AUGSPURGER NFA:[id number] DOB: 1919/04/25 DOA: 03/23/2013 PCP: Loreen Freud, DO  HISTORY 79 year old female with history of GERD, restless leg syndrome, peripheral neuropathy, and bladder incontinence presents with a three-week history of cough, congestion, and shortness of breath. The patient was initially given azithromycin on 03/03/2013 and again on 03/09/2013 when there was no significant improvement. When her symptoms began, the patient had a fever of 100.46F. Since that time, the patient has not had any significant fevers. The patient wears supplemental oxygen 2 L at nighttime, but in the past 2-1/2 weeks, she has had to wear supplemental oxygen 24/7 because of her coughing and shortness of breath. There was no pre-existing diagnosis COPD, but the patient was exposed to significant amount of secondhand smoke from her late husband for at least 30 years. Chest x-ray was initially obtained on 03/07/2013 showed developing right lower lobe opacity. Repeat chest x-ray on 03/20/2013 showed mild persistent right lower lobe opacity. Both of these x-rays suggested hyperinflation. The patient's son called the primary care physician on Friday, 03/20/2013 with continued coughing and shortness of breath. The patient was given a prescription for moxifloxacin which she began taking that night through the morning of admission. Her symptoms did not improve.  These past 3 weeks, the patient has not had any significant fever except for the first day or 2. The biggest complaint is that of unrelenting coughing resulting in shortness of breath. The patient does not have any dyspnea at rest, PND, orthopnea, worsening peripheral edema. There has been no hemoptysis, vomiting, diarrhea, abdominal pain, dysuria, hematuria, syncope. She does not use any inhalers. The patient went to see her primary care provider 03/23/13 for the above symptoms. She was noted to have a pulse ox reading of 78% in the  office-->ED.   Assessment/Plan: Right lower lobe infiltrate/pneumonia  -CTA chest negative for PE -Clinically, it does not appear that the patient has a multi-drug-resistant organism or overwhelming infection as the patient does not appear clinically toxic and she is hemodynamically stable  -Therefore my overall suspicion of a drug-resistant organisms is low as the patient has no fever and no significant leukocytosis  -Nevertheless, I will continue the patient on broad-spectrum antibiotics--Zosyn and levofloxacin until her clinical situation is clarified  -Blood cultures x2 sets, urine Legionella antigen, urine Streptococcus pneumoniae antigen  -It appears that the patient's overwhelming complaint is that of a cough which may very well be due to airway hyperreactivity versus underlying COPD/cough variant asthma  -Start the patient on aerosolized albuterol  -May consider short course of steroids if no improvement  -cough is slightly better than yesterday generalized weakness  -Check UA and urine culture  -Check TSH--0.866 -Likely due to deconditioning  -EKG--sinus, unchanged LAFB Gastroesophageal reflux disease  -Continue proton pump inhibitor  Peripheral neuropathy  -Continue gabapentin, fentanyl patch, tramadol  CODE STATUS  -Full code, verified with the patient's son    Family Communication:   [email or web address] at beside Disposition Plan:   Home when medically stable    Antibiotics:  Zosyn 03/23/13>>>  Levofloxacin  03/23/13>>>    Procedures/Studies: Dg Chest 2 View  03/20/2013  *RADIOLOGY REPORT*  Clinical Data: Pneumonia.  CHEST - 2 VIEW  Comparison: 03/07/2013.  Findings: The heart is borderline enlarged but stable.  The mediastinal and hilar contours are stable.  A hiatal hernia is again demonstrated.  There is persistent density right lung base which could reflect infiltrate.  IMPRESSION: Persistent right basilar density could reflect an infiltrate.  Original Report  Authenticated By: Rudie Meyer, M.D.    Dg Chest 2 View  03/07/2013  *RADIOLOGY REPORT*  Clinical Data: Short of breath, cough  CHEST - 2 VIEW  Comparison: Prior chest x-ray 03/03/2013  Findings: Slightly increased patchy opacity in the right base. Otherwise, similar appearance of hyperexpansion and central bronchitic changes consistent with underlying COPD.  Moderate sized hiatal hernia.  Cardiac and mediastinal contours remain within normal limits.  The bones are diffusely osteopenic.  There is exaggerated thoracic kyphosis with probable multiple compression fractures.  No acute osseous abnormality identified.  IMPRESSION:  Developing patchy opacity in the right lower lobe may represent infiltrate, or possibly a small amount of aspiration.  Background changes of COPD.  Moderate hiatal hernia.   Original Report Authenticated By: Malachy Moan, M.D.    Dg Chest 2 View  03/03/2013  *RADIOLOGY REPORT*  Clinical Data: History of coughing.  Shortness of breath.  CHEST - 2 VIEW  Comparison: 02/23/2009.  Findings: There is borderline cardiac silhouette enlargement. Ectasia, calcification, and tortuosity of the descending aorta is seen.  There is scoliosis convexity to the left with slight rotation of the mediastinum.  Left hilar prominence most likely is vascular.  There is a moderately large hiatal hernia in the retrocardiac region.  There is osteopenic appearance of the bones. Changes of degenerative disc disease and degenerative spondylosis are seen.  There is slight loss of height involving the upper mid thoracic vertebral body.  No pleural effusion is seen.  IMPRESSION: Borderline cardiac silhouette enlargement.  Scoliosis distorts the mediastinal structures.  The prominent left hilum is probably stable and is probably vascular.  Moderately large hiatal hernia. No pulmonary edema, consolidation, or pleural effusion.  Chronic bony abnormalities are detailed above.   Original Report Authenticated By: Onalee Hua Call     Ct Angio Chest W/cm &/or Wo Cm  03/23/2013  *RADIOLOGY REPORT*  Clinical Data: Cough for 3 weeks, history of pulmonary hypertension  CT ANGIOGRAPHY CHEST  Technique:  Multidetector CT imaging of the chest using the standard protocol during bolus administration of intravenous contrast. Multiplanar reconstructed images including MIPs were obtained and reviewed to evaluate the vascular anatomy.  Contrast: 80mL OMNIPAQUE IOHEXOL 350 MG/ML SOLN  Comparison: None.  Findings: Scattered atherosclerotic calcifications aorta and coronary arteries. No aortic aneurysm or dissection. Pulmonary arteries well opacified and patent. No evidence of pulmonary embolism. Large hiatal hernia. Elevation of right diaphragm with right basilar atelectasis. Mild patchy infiltrate in right lower lobe and right middle lobe. Tiny bibasilar pleural effusions. No pneumothorax. Visualized portion upper abdomen unremarkable.  IMPRESSION: No evidence of pulmonary embolism. Right basilar atelectasis with mild patchy infiltrate in right middle and lower lobes. Tiny bibasilar pleural effusions. Large hiatal hernia.   Original Report Authenticated By: Ulyses Southward, M.D.          Subjective: Patient feels that the cough is low but yesterday. Denies any fevers, chills, shortness of breath, nausea, vomiting, diarrhea, abdominal pain. No dysuria or hematuria.  Objective: Filed Vitals:   03/24/13 0630 03/24/13 0845 03/24/13 1417 03/24/13 1449  BP: 99/56  145/68   Pulse: 57  76   Temp: 97.1 F (36.2 C)  97.2 F (36.2 C)   TempSrc: Oral  Oral   Resp: 18  18   Height:      Weight:      SpO2: 99% 98% 95% 96%    Intake/Output Summary (Last 24 hours) at 03/24/13 1607 Last data filed at 03/24/13 1417  Gross per  24 hour  Intake 1539.25 ml  Output   1122 ml  Net 417.25 ml   Weight change:  Exam:   General:  Pt is alert, follows commands appropriately, not in acute distress  HEENT: No icterus, No thrush, No neck mass,  [place]/AT  Cardiovascular: RRR, S1/S2, no rubs, no gallops  Respiratory: Bibasilar crackles. No wheezes or rhonchi. Good air movement.  Abdomen: Soft/+BS, non tender, non distended, no guarding  Extremities: No edema, No lymphangitis, No petechiae, No rashes, no synovitis  Data Reviewed: Basic Metabolic Panel:  Recent Labs Lab 03/23/13 1150 03/24/13 0530  NA 144 139  K 3.9 3.7  CL 105 104  CO2 31 30  GLUCOSE 83 109*  BUN 20 14  CREATININE 0.50 0.48*  CALCIUM 9.2 8.4   Liver Function Tests:  Recent Labs Lab 03/23/13 1150  AST 24  ALT 13  ALKPHOS 60  BILITOT 0.3  PROT 6.6  ALBUMIN 2.9*   No results found for this basename: LIPASE, AMYLASE,  in the last 168 hours No results found for this basename: AMMONIA,  in the last 168 hours CBC:  Recent Labs Lab 03/23/13 1150 03/24/13 0530  WBC 6.5 4.0  NEUTROABS 4.2  --   HGB 10.3* 9.5*  HCT 31.9* 28.8*  MCV 97.9 96.3  PLT 207 189   Cardiac Enzymes: No results found for this basename: CKTOTAL, CKMB, CKMBINDEX, TROPONINI,  in the last 168 hours BNP: No components found with this basename: POCBNP,  CBG: No results found for this basename: GLUCAP,  in the last 168 hours  No results found for this or any previous visit (from the past 240 hour(s)).   Scheduled Meds: . albuterol  2.5 mg Nebulization TID  . calcium carbonate  1,250 mg Oral Daily  . darifenacin  15 mg Oral Daily  . docusate sodium  100 mg Oral BID  . enoxaparin (LOVENOX) injection  30 mg Subcutaneous Q24H  . [START ON 03/26/2013] fentaNYL  12.5 mcg Transdermal Q72H  . gabapentin  100 mg Oral Q1400  . gabapentin  200 mg Oral BID  . [START ON 03/25/2013] levofloxacin  500 mg Oral Q48H  . multivitamin with minerals  1 tablet Oral Daily  . omega-3 acid ethyl esters  1 g Oral Q1200  . pantoprazole  80 mg Oral Daily  . piperacillin-tazobactam (ZOSYN)  IV  3.375 g Intravenous Q8H  . polyethylene glycol  17 g Oral Daily  . pramipexole  0.5 mg Oral QHS  .  sodium chloride  3 mL Intravenous Q12H   Continuous Infusions: . dextrose 5 % and 0.45% NaCl 75 mL/hr at 03/23/13 1955     Ilina Xu, DO  [facility] Pager [phone]  If 7PM-7AM, please contact night-coverage [email or web address] Password [email or web address] 03/24/2013, 4:07 PM   LOS: 1 day

## 2013-03-24 NOTE — Progress Notes (Signed)
Nutrition Brief Note  Patient identified on the Malnutrition Screening Tool (MST) Report  Body mass index is 19.52 kg/(m^2). Patient meets criteria for normal weight based on current BMI.   Current diet order is regular, patient is consuming approximately 90% of meals at this time. Labs and medications reviewed. Met with pt and daughter who report pt eating well PTA, pt eating lots of meals/snacks throughout the day. Pt reports usual body weight of 82 pounds. Pt denies any problems chewing/swallowing. Pt reports she has been told to drink 3 Ensure/day however she does like Ensure. Discussed [employer] which pt is agreeable to trying. Pt currently eating well without any nutrition concerns.   No nutrition interventions warranted at this time. If nutrition issues arise, please consult RD.   Kerry Hedger MS, RD, LDN [phone] Pager [phone] After Hours Pager

## 2013-03-24 NOTE — Progress Notes (Signed)
Visited with patient at request of nursing staff. She seemed to want to meet the chaplain's service dog. She expressed that she is feeling somewhat better and is looking forward to getting back home.  I offered support and friendship and patience for her continuing recovery. Had a short prayer with the patient at the end of my visit. Patient relates that she has difficulty hearing and that her hearing aids don't seem to work as well as she would like.

## 2013-03-25 DIAGNOSIS — R0602 Shortness of breath: Secondary | ICD-10-CM

## 2013-03-25 LAB — CBC
Hemoglobin: 9.6 g/dL — ABNORMAL LOW (ref 12.0–15.0)
MCH: 31.5 pg (ref 26.0–34.0)
MCV: 95.7 fL (ref 78.0–100.0)
Platelets: 206 10*3/uL (ref 150–400)
RBC: 3.05 MIL/uL — ABNORMAL LOW (ref 3.87–5.11)
WBC: 5.3 10*3/uL (ref 4.0–10.5)

## 2013-03-25 LAB — BASIC METABOLIC PANEL
CO2: 30 mEq/L (ref 19–32)
Glucose, Bld: 104 mg/dL — ABNORMAL HIGH (ref 70–99)
Potassium: 3.5 mEq/L (ref 3.5–5.1)
Sodium: 142 mEq/L (ref 135–145)

## 2013-03-25 LAB — URINALYSIS, MICROSCOPIC ONLY
Leukocytes, UA: NEGATIVE
Nitrite: NEGATIVE
Specific Gravity, Urine: 1.005 (ref 1.005–1.030)
Urobilinogen, UA: 0.2 mg/dL (ref 0.0–1.0)
pH: 6 (ref 5.0–8.0)

## 2013-03-25 LAB — STREP PNEUMONIAE URINARY ANTIGEN: Strep Pneumo Urinary Antigen: NEGATIVE

## 2013-03-25 MED ORDER — PREDNISONE 50 MG PO TABS
60.0000 mg | ORAL_TABLET | Freq: Every day | ORAL | Status: DC
  Administered 2013-03-25: 60 mg via ORAL
  Filled 2013-03-25 (×2): qty 1

## 2013-03-25 MED ORDER — PREDNISONE 50 MG PO TABS
50.0000 mg | ORAL_TABLET | Freq: Every day | ORAL | Status: DC
  Administered 2013-03-26 – 2013-03-27 (×2): 50 mg via ORAL
  Filled 2013-03-25 (×3): qty 1

## 2013-03-25 MED ORDER — IPRATROPIUM BROMIDE 0.02 % IN SOLN
0.5000 mg | Freq: Three times a day (TID) | RESPIRATORY_TRACT | Status: DC
  Administered 2013-03-25 – 2013-03-27 (×6): 0.5 mg via RESPIRATORY_TRACT
  Filled 2013-03-25 (×7): qty 2.5

## 2013-03-25 NOTE — Discharge Summary (Addendum)
Physician Discharge Summary  Kerry Myers ZOX:[id number] DOB: 08/28/1919 DOA: 03/23/2013  PCP: Loreen Freud, DO  Admit date: 03/23/2013 Discharge date: 03/27/2013  Recommendations for Outpatient Follow-up:  1. Follow up with primary care doctor within 5 days of discharge.  Pls follow up final results of blood cultures.   2. Continue antibiotics as prescribed 3. Continue strict aspiration precautions 4. Home PT/OT/RN have been arranged  Discharge Diagnoses:  Active Problems:   OSTEOPOROSIS   GERD   Anemia   Aspiration pneumonia   Generalized weakness   Hypoxemia   Acute and chronic respiratory failure   Dysphagia, unspecified   Discharge Condition: stable, improved  Diet recommendation: regular  Wt Readings from Last 3 Encounters:  03/23/13 36.741 kg (81 lb)  03/23/13 39.009 kg (86 lb)  03/09/13 36.923 kg (81 lb 6.4 oz)    History of present illness:   79 year old female with history of GERD, restless leg syndrome, peripheral neuropathy, and bladder incontinence presents with a three-week history of cough, congestion, and shortness of breath. The patient was initially given azithromycin on 03/03/2013 and again on 03/09/2013 when there was no significant improvement. When her symptoms began, the patient had a fever of 100.34F. Since that time, the patient has not had any significant fevers. The patient wears supplemental oxygen 2 L at nighttime, but in the past 2-1/2 weeks, she has had to wear supplemental oxygen 24/7 because of her coughing and shortness of breath. There was no pre-existing diagnosis COPD, but the patient was exposed to significant amount of secondhand smoke from her late husband for at least 30 years. Chest x-ray was initially obtained on 03/07/2013 showed developing right lower lobe opacity. Repeat chest x-ray on 03/20/2013 showed mild persistent right lower lobe opacity. Both of these x-rays suggested hyperinflation. The patient's son called the primary care  physician on Friday, 03/20/2013 with continued coughing and shortness of breath. The patient was given a prescription for moxifloxacin which she began taking that night through this morning. Her symptoms did not improve.   These past 3 weeks, the patient has not had any significant fever except for the first day or 2. The biggest complaint is that of unrelenting coughing resulting in shortness of breath. The patient does not have any dyspnea at rest, PND, orthopnea, worsening peripheral edema. There has been no hemoptysis, vomiting, diarrhea, abdominal pain, dysuria, hematuria, syncope. The patient has tried Robitussin and Mucinex D. for her cough which has not helped. She does not use any inhalers. The patient went to see her primary care provider today for the above symptoms. She was noted to have a pulse ox reading of 78% in the office. As a result she was sent to the emergency department at [facility] point. Now the pt is a direct admit from there. Blood work reveals WBC 6.5, hemoglobin 10.3, proBNP 169, BUN 20, creatinine 0.50, sodium 144. CT angiogram chest negative for pulmonary embolus but shows right lower lobe patchy infiltrate.   Hospital Course:   Right lower lobe infiltrate/pneumonia.  Per history, she has had problems with swallowing and coughing related to meal times.  She was initially started on broad spectrum antibiotics, but after this history was revealed, she was switched to zosyn monotherapy and will be discharged on augmentin.  She was seen by speech therapy, see below.  She initially required continuous 2L O2, however, on the day of discharge, she was able to ambulate down the hall on room air and maintain her oxygen saturations in  the mid 90s.  She was continued on cough suppressants and expectorants and tessalon was added.  Urine Legionella antigen neg, Streptococcus pneumoniae antigen neg.  She may continue albuterol as needed.  I also added a Masao Junker steroid taper to see if that  may help her cough also.    Generalized weakness:  Likely related to poor nutritional status, deconditionaing and ongoing cough/pneumonia.  UA neg and urine culture neg.  TSH was 0.866   Gastroesophageal reflux disease stable.  Continue high dose PPI.    Peripheral neuropathy Stable.  Continued gabapentin, fentanyl patch, tramadol   Dysphagia:  Speech therapy evaluated the patient.  She underwent MBS which demonstrated risk factors for aspiration.  She was given strict instructions on prevention of aspiration, but may continue a regular diet and thin.    Foot ulcer, healing.  Seen by wound therapy and results reported to wound care clinic.    Procedures:  CT a chest   Consultations:  None  Antibiotics:  Zosyn 03/23/13>>> 3/26  Levofloxacin 03/23/13>>> 3/27   Discharge Exam: Filed Vitals:   03/27/13 1419  BP: 147/75  Pulse: 98  Temp: 98.2 F (36.8 C)  Resp:    Filed Vitals:   03/27/13 0830 03/27/13 1100 03/27/13 1201 03/27/13 1419  BP: 137/70 127/70  147/75  Pulse: 74 75 89 98  Temp: 98.1 F (36.7 C) 98.1 F (36.7 C)  98.2 F (36.8 C)  TempSrc: Oral Oral  Oral  Resp: 18 18    Height:      Weight:      SpO2: 92% 94% 94% 98%   Patient states that her cough is better and she was able to ambulate up and down the hall with fatigue but without SOB.  Denies any fevers, chills, nausea, vomiting, diarrhea, abdominal pain.   General: Caucasian female, thin, no in acute distress, hearing impairment  HEENT: No icterus, No thrush, No neck mass, [place]/AT  Cardiovascular: RRR, S1/S2, no rubs, no gallops  Respiratory: Bibasilar diminished BS. No wheezes, rales, or rhonchi. No increased WOB Abdomen: Soft/+BS, non tender, non distended, no guarding  Extremities: No edema, healing right dorsum of foot ulcer.   Discharge Instructions      Discharge Orders   Future Orders Complete By Expires     Call MD for:  difficulty breathing, headache or visual disturbances  As directed      Call MD for:  extreme fatigue  As directed     Call MD for:  hives  As directed     Call MD for:  persistant dizziness or light-headedness  As directed     Call MD for:  persistant nausea and vomiting  As directed     Call MD for:  severe uncontrolled pain  As directed     Call MD for:  temperature >100.4  As directed     Diet general  As directed     Comments:      Regular;Thin liquid  Liquid Administration via: Cup Medication Administration: Whole meds with puree Supervision: Patient able to self feed Compensations: Slow rate;Small sips/bites;Follow solids with liquid Postural Changes and/or Swallow Maneuvers: Seated upright 90 degrees;  Upright 30-60 min after meal    Discharge instructions  As directed     Comments:      You were hospitalized with persistent cough and were found to have persistent pneumonia.  You may have aspiration pneumonia and were started on zosyn IV and then transitioned to augmentin to continue a total  of 10 days of antibiotics.  Please take your next dose this evening and continue them twice daily until they are gone.  You may add tessalon cough medication as needed to your current cough medications.  After reviewing your medications, I recommend you stop your fish oil for now and see if they may also help your cough.  You may also try using an albuterol inhaler and a Keiera Strathman prednisone taper to suppress inflammation.  Please follow up with your primary care doctor next week.  You will have physical and occupational therapy and a home health nurse come to your house.  You may use your oxygen at night at before.  Finally, please follow Tammy's instructions to prevent aspiration.    Increase activity slowly  As directed         Medication List    STOP taking these medications       moxifloxacin 400 MG tablet  Commonly known as:  AVELOX     omega-3 acid ethyl esters 1 G capsule  Commonly known as:  LOVAZA     UNABLE TO FIND      TAKE these medications        albuterol 108 (90 BASE) MCG/ACT inhaler  Commonly known as:  PROVENTIL HFA;VENTOLIN HFA  Inhale 2 puffs into the lungs every 6 (six) hours as needed for wheezing.     amoxicillin-clavulanate 875-125 MG per tablet  Commonly known as:  AUGMENTIN  Take 1 tablet by mouth every 12 (twelve) hours.     benzonatate 100 MG capsule  Commonly known as:  TESSALON  Take 1 capsule (100 mg total) by mouth 3 (three) times daily as needed for cough.     calcium carbonate 600 MG Tabs  Commonly known as:  OS-CAL  Take 600 mg by mouth daily.     feeding supplement Liqd  Take 1 Container by mouth 2 (two) times daily between meals.     fentaNYL 12 MCG/HR  Commonly known as:  DURAGESIC - dosed mcg/hr  Place 1 patch onto the skin every 3 (three) days.     gabapentin 100 MG capsule  Commonly known as:  NEURONTIN  Take 100-200 mg by mouth 3 (three) times daily. Takes 200mg  every morning, 100mg  every day at noon, and 200mg  every night.     guaiFENesin-codeine 100-10 MG/5ML syrup  Commonly known as:  ROBITUSSIN AC  Take 5 mLs by mouth 2 (two) times daily as needed for cough.     l-methylfolate-B6-B12 3-35-2 MG Tabs  Commonly known as:  METANX  Take 1 tablet by mouth 2 (two) times daily.     multivitamin with minerals Tabs  Take 1 tablet by mouth daily.     omeprazole 40 MG capsule  Commonly known as:  PRILOSEC  Take 40 mg by mouth at bedtime.     polyethylene glycol powder powder  Commonly known as:  GLYCOLAX/MIRALAX  Take 17 g by mouth every morning.     pramipexole 0.5 MG tablet  Commonly known as:  MIRAPEX  Take 0.5 mg by mouth at bedtime.     predniSONE 10 MG tablet  Commonly known as:  DELTASONE  Take 3 tabs on 3/29, 2 tabs on 3/30, and 1 tab on 3/31, then stop     solifenacin 10 MG tablet  Commonly known as:  VESICARE  Take 5 mg by mouth 2 (two) times daily.     STOOL SOFTENER PO  Take 2 tablets by mouth 2 (two) times daily.  traMADol 50 MG tablet  Commonly known as:   ULTRAM  Take 50 mg by mouth 2 (two) times daily as needed for pain.     vitamin C 1000 MG tablet  Take 1,000 mg by mouth daily.     Vitamin D3 5000 UNITS Caps  Take 1 capsule by mouth daily.       Follow-up Information   Follow up with Loreen Freud, DO. Schedule an appointment as soon as possible for a visit in 1 week.   Contact information:   [address] [facility] [address] [place] [address] [phone]        The results of significant diagnostics from this hospitalization (including imaging, microbiology, ancillary and laboratory) are listed below for reference.    Significant Diagnostic Studies: Dg Chest 2 View  03/20/2013  *RADIOLOGY REPORT*  Clinical Data: Pneumonia.  CHEST - 2 VIEW  Comparison: 03/07/2013.  Findings: The heart is borderline enlarged but stable.  The mediastinal and hilar contours are stable.  A hiatal hernia is again demonstrated.  There is persistent density right lung base which could reflect infiltrate.  IMPRESSION: Persistent right basilar density could reflect an infiltrate.   Original Report Authenticated By: Rudie Meyer, M.D.    Dg Chest 2 View  03/07/2013  *RADIOLOGY REPORT*  Clinical Data: Darely Becknell of breath, cough  CHEST - 2 VIEW  Comparison: Prior chest x-ray 03/03/2013  Findings: Slightly increased patchy opacity in the right base. Otherwise, similar appearance of hyperexpansion and central bronchitic changes consistent with underlying COPD.  Moderate sized hiatal hernia.  Cardiac and mediastinal contours remain within normal limits.  The bones are diffusely osteopenic.  There is exaggerated thoracic kyphosis with probable multiple compression fractures.  No acute osseous abnormality identified.  IMPRESSION:  Developing patchy opacity in the right lower lobe may represent infiltrate, or possibly a small amount of aspiration.  Background changes of COPD.  Moderate hiatal hernia.   Original Report Authenticated By: Malachy Moan,  M.D.    Dg Chest 2 View  03/03/2013  *RADIOLOGY REPORT*  Clinical Data: History of coughing.  Shortness of breath.  CHEST - 2 VIEW  Comparison: 02/23/2009.  Findings: There is borderline cardiac silhouette enlargement. Ectasia, calcification, and tortuosity of the descending aorta is seen.  There is scoliosis convexity to the left with slight rotation of the mediastinum.  Left hilar prominence most likely is vascular.  There is a moderately large hiatal hernia in the retrocardiac region.  There is osteopenic appearance of the bones. Changes of degenerative disc disease and degenerative spondylosis are seen.  There is slight loss of height involving the upper mid thoracic vertebral body.  No pleural effusion is seen.  IMPRESSION: Borderline cardiac silhouette enlargement.  Scoliosis distorts the mediastinal structures.  The prominent left hilum is probably stable and is probably vascular.  Moderately large hiatal hernia. No pulmonary edema, consolidation, or pleural effusion.  Chronic bony abnormalities are detailed above.   Original Report Authenticated By: Onalee Hua Call    Ct Angio Chest W/cm &/or Wo Cm  03/23/2013  *RADIOLOGY REPORT*  Clinical Data: Cough for 3 weeks, history of pulmonary hypertension  CT ANGIOGRAPHY CHEST  Technique:  Multidetector CT imaging of the chest using the standard protocol during bolus administration of intravenous contrast. Multiplanar reconstructed images including MIPs were obtained and reviewed to evaluate the vascular anatomy.  Contrast: 80mL OMNIPAQUE IOHEXOL 350 MG/ML SOLN  Comparison: None.  Findings: Scattered atherosclerotic calcifications aorta and coronary arteries. No aortic aneurysm or  dissection. Pulmonary arteries well opacified and patent. No evidence of pulmonary embolism. Large hiatal hernia. Elevation of right diaphragm with right basilar atelectasis. Mild patchy infiltrate in right lower lobe and right middle lobe. Tiny bibasilar pleural effusions. No  pneumothorax. Visualized portion upper abdomen unremarkable.  IMPRESSION: No evidence of pulmonary embolism. Right basilar atelectasis with mild patchy infiltrate in right middle and lower lobes. Tiny bibasilar pleural effusions. Large hiatal hernia.   Original Report Authenticated By: Ulyses Southward, M.D.     Microbiology: Recent Results (from the past 240 hour(s))  CULTURE, BLOOD (ROUTINE X 2)     Status: None   Collection Time    03/23/13  6:55 PM      Result Value Range Status   Specimen Description BLOOD LEFT ARM   Final   Special Requests BOTTLES DRAWN AEROBIC AND ANAEROBIC 5CC   Final   Culture  Setup Time 03/24/2013 02:46   Final   Culture     Final   Value:        BLOOD CULTURE RECEIVED NO GROWTH TO DATE CULTURE WILL BE HELD FOR 5 DAYS BEFORE ISSUING A FINAL NEGATIVE REPORT   Report Status PENDING   Incomplete  CULTURE, BLOOD (ROUTINE X 2)     Status: None   Collection Time    03/23/13  7:02 PM      Result Value Range Status   Specimen Description BLOOD LEFT WRIST   Final   Special Requests BOTTLES DRAWN AEROBIC ONLY 2CC   Final   Culture  Setup Time 03/24/2013 02:46   Final   Culture     Final   Value:        BLOOD CULTURE RECEIVED NO GROWTH TO DATE CULTURE WILL BE HELD FOR 5 DAYS BEFORE ISSUING A FINAL NEGATIVE REPORT   Report Status PENDING   Incomplete  URINE CULTURE     Status: None   Collection Time    03/25/13  1:12 AM      Result Value Range Status   Specimen Description URINE, CLEAN CATCH   Final   Special Requests NONE   Final   Culture  Setup Time 03/25/2013 05:52   Final   [place] Count NO GROWTH   Final   Culture NO GROWTH   Final   Report Status 03/26/2013 FINAL   Final     Labs: Basic Metabolic Panel:  Recent Labs Lab 03/23/13 1150 03/24/13 0530 03/25/13 0519  NA 144 139 142  K 3.9 3.7 3.5  CL 105 104 107  CO2 31 30 30   GLUCOSE 83 109* 104*  BUN 20 14 11   CREATININE 0.50 0.48* 0.52  CALCIUM 9.2 8.4 8.4   Liver Function Tests:  Recent  Labs Lab 03/23/13 1150  AST 24  ALT 13  ALKPHOS 60  BILITOT 0.3  PROT 6.6  ALBUMIN 2.9*   No results found for this basename: LIPASE, AMYLASE,  in the last 168 hours No results found for this basename: AMMONIA,  in the last 168 hours CBC:  Recent Labs Lab 03/23/13 1150 03/24/13 0530 03/25/13 0519  WBC 6.5 4.0 5.3  NEUTROABS 4.2  --   --   HGB 10.3* 9.5* 9.6*  HCT 31.9* 28.8* 29.2*  MCV 97.9 96.3 95.7  PLT 207 189 206   Cardiac Enzymes: No results found for this basename: CKTOTAL, CKMB, CKMBINDEX, TROPONINI,  in the last 168 hours BNP: BNP (last 3 results)  Recent Labs  03/23/13 1150  PROBNP 169.4   CBG:  No results found for this basename: GLUCAP,  in the last 168 hours  Time coordinating discharge: 45 minutes  Signed:  Wess Baney  [facility] 03/27/2013, 3:30 PM

## 2013-03-25 NOTE — Progress Notes (Signed)
Patient requesting a breathing treatment, respiratory notified.

## 2013-03-25 NOTE — Progress Notes (Addendum)
[facility] PROGRESS NOTE  Kerry Myers NWG:[id number] DOB: 1919-01-06 DOA: 03/23/2013 PCP: Loreen Freud, DO  HISTORY 79 year old female with history of GERD, restless leg syndrome, peripheral neuropathy, and bladder incontinence presents with a three-week history of cough, congestion, and shortness of breath. The patient was initially given azithromycin on 03/03/2013 and again on 03/09/2013 when there was no significant improvement. When her symptoms began, the patient had a fever of 100.36F. Since that time, the patient has not had any significant fevers. The patient wears supplemental oxygen 2 L at nighttime, but in the past 2-1/2 weeks, she has had to wear supplemental oxygen 24/7 because of her coughing and shortness of breath. There was no pre-existing diagnosis COPD, but the patient was exposed to significant amount of secondhand smoke from her late husband for at least 30 years. Chest x-ray was initially obtained on 03/07/2013 showed developing right lower lobe opacity. Repeat chest x-ray on 03/20/2013 showed mild persistent right lower lobe opacity. Both of these x-rays suggested hyperinflation. The patient's son called the primary care physician on Friday, 03/20/2013 with continued coughing and shortness of breath. The patient was given a prescription for moxifloxacin which she began taking that night through the morning of admission. Her symptoms did not improve.  These past 3 weeks, the patient has not had any significant fever except for the first day or 2. The biggest complaint is that of unrelenting coughing resulting in shortness of breath. The patient does not have any dyspnea at rest, PND, orthopnea, worsening peripheral edema. There has been no hemoptysis, vomiting, diarrhea, abdominal pain, dysuria, hematuria, syncope. She does not use any inhalers. The patient went to see her primary care provider 03/23/13 for the above symptoms. She was noted to have a pulse ox reading of 78% in the  office-->ED.   Assessment/Plan: Right lower lobe infiltrate/pneumonia with acute on chronic respiratory failure.  Clinically improving, however, still requiring 2L O2.  Breathing treatments have been helping her cough and SOB.   -  CTA chest negative for PE - Continue FQ  -  d/c zosyn.  (No evidence of aspiration and no risk factor for HCAP/PSA infection.) - Blood cultures x2 sets NGTD - Urine Legionella antigen neg -  urine Streptococcus pneumoniae antigen neg - continue albuterol and add ipratropium - Add Tanasia Budzinski course of steroids 60mg  on 3/26, then 50mg  on 3/27, etc. -  Wean daytime oxygen as tolerated.  generalized weakness  -UA neg and urine culture pending -Check TSH--0.866 -Likely due to deconditioning and PNA -EKG--sinus, unchanged LAFB  Gastroesophageal reflux disease  -Continue proton pump inhibitor   Peripheral neuropathy  -Continue gabapentin, fentanyl patch, tramadol   CODE STATUS  -Full code, verified with the patient's son  Family Communication:   Daughter  at beside Disposition Plan:   Home when medically stable   Antibiotics:  Zosyn 03/23/13>>> 3/26  Levofloxacin  03/23/13>>>    Procedures/Studies: Dg Chest 2 View  03/20/2013  *RADIOLOGY REPORT*  Clinical Data: Pneumonia.  CHEST - 2 VIEW  Comparison: 03/07/2013.  Findings: The heart is borderline enlarged but stable.  The mediastinal and hilar contours are stable.  A hiatal hernia is again demonstrated.  There is persistent density right lung base which could reflect infiltrate.  IMPRESSION: Persistent right basilar density could reflect an infiltrate.   Original Report Authenticated By: Rudie Meyer, M.D.    Dg Chest 2 View  03/07/2013  *RADIOLOGY REPORT*  Clinical Data: Vicci Reder of breath, cough  CHEST - 2 VIEW  Comparison:  Prior chest x-ray 03/03/2013  Findings: Slightly increased patchy opacity in the right base. Otherwise, similar appearance of hyperexpansion and central bronchitic changes consistent  with underlying COPD.  Moderate sized hiatal hernia.  Cardiac and mediastinal contours remain within normal limits.  The bones are diffusely osteopenic.  There is exaggerated thoracic kyphosis with probable multiple compression fractures.  No acute osseous abnormality identified.  IMPRESSION:  Developing patchy opacity in the right lower lobe may represent infiltrate, or possibly a small amount of aspiration.  Background changes of COPD.  Moderate hiatal hernia.   Original Report Authenticated By: Malachy Moan, M.D.    Dg Chest 2 View  03/03/2013  *RADIOLOGY REPORT*  Clinical Data: History of coughing.  Shortness of breath.  CHEST - 2 VIEW  Comparison: 02/23/2009.  Findings: There is borderline cardiac silhouette enlargement. Ectasia, calcification, and tortuosity of the descending aorta is seen.  There is scoliosis convexity to the left with slight rotation of the mediastinum.  Left hilar prominence most likely is vascular.  There is a moderately large hiatal hernia in the retrocardiac region.  There is osteopenic appearance of the bones. Changes of degenerative disc disease and degenerative spondylosis are seen.  There is slight loss of height involving the upper mid thoracic vertebral body.  No pleural effusion is seen.  IMPRESSION: Borderline cardiac silhouette enlargement.  Scoliosis distorts the mediastinal structures.  The prominent left hilum is probably stable and is probably vascular.  Moderately large hiatal hernia. No pulmonary edema, consolidation, or pleural effusion.  Chronic bony abnormalities are detailed above.   Original Report Authenticated By: Onalee Hua Call    Ct Angio Chest W/cm &/or Wo Cm  03/23/2013  *RADIOLOGY REPORT*  Clinical Data: Cough for 3 weeks, history of pulmonary hypertension  CT ANGIOGRAPHY CHEST  Technique:  Multidetector CT imaging of the chest using the standard protocol during bolus administration of intravenous contrast. Multiplanar reconstructed images including MIPs  were obtained and reviewed to evaluate the vascular anatomy.  Contrast: 80mL OMNIPAQUE IOHEXOL 350 MG/ML SOLN  Comparison: None.  Findings: Scattered atherosclerotic calcifications aorta and coronary arteries. No aortic aneurysm or dissection. Pulmonary arteries well opacified and patent. No evidence of pulmonary embolism. Large hiatal hernia. Elevation of right diaphragm with right basilar atelectasis. Mild patchy infiltrate in right lower lobe and right middle lobe. Tiny bibasilar pleural effusions. No pneumothorax. Visualized portion upper abdomen unremarkable.  IMPRESSION: No evidence of pulmonary embolism. Right basilar atelectasis with mild patchy infiltrate in right middle and lower lobes. Tiny bibasilar pleural effusions. Large hiatal hernia.   Original Report Authenticated By: Ulyses Southward, M.D.          Subjective: Patient feels that the cough is stable, however, she feels less SOB.  Cough productive of yellow sputum, no hemoptyisis.  Denies any fevers, chills, nausea, vomiting, diarrhea, abdominal pain. No dysuria or hematuria.  Was able to ambulate down hall with PT.    Objective: Filed Vitals:   03/24/13 1449 03/24/13 2030 03/25/13 0540 03/25/13 1218  BP:  169/48 114/64   Pulse:  82 59   Temp:  98 F (36.7 C) 97.8 F (36.6 C)   TempSrc:  Oral Oral   Resp:  16 20   Height:      Weight:      SpO2: 96% 96% 98% 100%    Intake/Output Summary (Last 24 hours) at 03/25/13 1332 Last data filed at 03/25/13 0535  Gross per 24 hour  Intake    240 ml  Output  1100 ml  Net   -860 ml   Weight change:  Exam:   General:  Caucasian female, thin, no in acute distress, hearing impairment  HEENT: No icterus, No thrush, No neck mass, [place]/AT  Cardiovascular: RRR, S1/S2, no rubs, no gallops  Respiratory: Bibasilar diminished BS with faint rales. No wheezes or rhonchi. Good air movement.  Abdomen: Soft/+BS, non tender, non distended, no guarding  Extremities: No edema, No  lymphangitis, No petechiae, No rashes, no synovitis  Data Reviewed: Basic Metabolic Panel:  Recent Labs Lab 03/23/13 1150 03/24/13 0530 03/25/13 0519  NA 144 139 142  K 3.9 3.7 3.5  CL 105 104 107  CO2 31 30 30   GLUCOSE 83 109* 104*  BUN 20 14 11   CREATININE 0.50 0.48* 0.52  CALCIUM 9.2 8.4 8.4   Liver Function Tests:  Recent Labs Lab 03/23/13 1150  AST 24  ALT 13  ALKPHOS 60  BILITOT 0.3  PROT 6.6  ALBUMIN 2.9*   No results found for this basename: LIPASE, AMYLASE,  in the last 168 hours No results found for this basename: AMMONIA,  in the last 168 hours CBC:  Recent Labs Lab 03/23/13 1150 03/24/13 0530 03/25/13 0519  WBC 6.5 4.0 5.3  NEUTROABS 4.2  --   --   HGB 10.3* 9.5* 9.6*  HCT 31.9* 28.8* 29.2*  MCV 97.9 96.3 95.7  PLT 207 189 206   Cardiac Enzymes: No results found for this basename: CKTOTAL, CKMB, CKMBINDEX, TROPONINI,  in the last 168 hours BNP: No components found with this basename: POCBNP,  CBG: No results found for this basename: GLUCAP,  in the last 168 hours  Recent Results (from the past 240 hour(s))  CULTURE, BLOOD (ROUTINE X 2)     Status: None   Collection Time    03/23/13  6:55 PM      Result Value Range Status   Specimen Description BLOOD LEFT ARM   Final   Special Requests BOTTLES DRAWN AEROBIC AND ANAEROBIC 5CC   Final   Culture  Setup Time 03/24/2013 02:46   Final   Culture     Final   Value:        BLOOD CULTURE RECEIVED NO GROWTH TO DATE CULTURE WILL BE HELD FOR 5 DAYS BEFORE ISSUING A FINAL NEGATIVE REPORT   Report Status PENDING   Incomplete  CULTURE, BLOOD (ROUTINE X 2)     Status: None   Collection Time    03/23/13  7:02 PM      Result Value Range Status   Specimen Description BLOOD LEFT WRIST   Final   Special Requests BOTTLES DRAWN AEROBIC ONLY 2CC   Final   Culture  Setup Time 03/24/2013 02:46   Final   Culture     Final   Value:        BLOOD CULTURE RECEIVED NO GROWTH TO DATE CULTURE WILL BE HELD FOR 5 DAYS  BEFORE ISSUING A FINAL NEGATIVE REPORT   Report Status PENDING   Incomplete     Scheduled Meds: . albuterol  2.5 mg Nebulization TID  . calcium carbonate  1,250 mg Oral Daily  . darifenacin  15 mg Oral Daily  . docusate sodium  100 mg Oral BID  . enoxaparin (LOVENOX) injection  30 mg Subcutaneous Q24H  . feeding supplement  1 Container Oral BID BM  . [START ON 03/26/2013] fentaNYL  12.5 mcg Transdermal Q72H  . gabapentin  100 mg Oral Q1400  . gabapentin  200 mg  Oral BID  . ipratropium  0.5 mg Nebulization TID  . levofloxacin  500 mg Oral Q48H  . multivitamin with minerals  1 tablet Oral Daily  . omega-3 acid ethyl esters  1 g Oral Q1200  . pantoprazole  80 mg Oral Daily  . polyethylene glycol  17 g Oral Daily  . pramipexole  0.5 mg Oral QHS  . predniSONE  60 mg Oral Q breakfast  . sodium chloride  3 mL Intravenous Q12H   Continuous Infusions:     Renae Fickle, DO  [facility] Pager [phone]  If 7PM-7AM, please contact night-coverage [email or web address] Password [email or web address] 03/25/2013, 1:32 PM   LOS: 2 days

## 2013-03-26 ENCOUNTER — Inpatient Hospital Stay (HOSPITAL_COMMUNITY): Payer: Medicare Other

## 2013-03-26 DIAGNOSIS — R1310 Dysphagia, unspecified: Secondary | ICD-10-CM

## 2013-03-26 DIAGNOSIS — J690 Pneumonitis due to inhalation of food and vomit: Principal | ICD-10-CM

## 2013-03-26 DIAGNOSIS — J9620 Acute and chronic respiratory failure, unspecified whether with hypoxia or hypercapnia: Secondary | ICD-10-CM

## 2013-03-26 LAB — URINE CULTURE

## 2013-03-26 LAB — LEGIONELLA ANTIGEN, URINE

## 2013-03-26 MED ORDER — PIPERACILLIN-TAZOBACTAM IN DEX 2-0.25 GM/50ML IV SOLN
2.2500 g | Freq: Three times a day (TID) | INTRAVENOUS | Status: DC
  Administered 2013-03-26 – 2013-03-27 (×3): 2.25 g via INTRAVENOUS
  Filled 2013-03-26 (×5): qty 50

## 2013-03-26 NOTE — Progress Notes (Signed)
ANTIBIOTIC CONSULT NOTE - INITIAL  Pharmacy Consult for Zosyn Indication: aspiration pneumonia  No Known Allergies  Patient Measurements: Height: 4\' 6"  (137.2 cm) Weight: 81 lb (36.741 kg) IBW/kg (Calculated) : 31.7 Adjusted Body Weight:   Vital Signs: Temp: 97.6 F (36.4 C) (03/27 0645) Temp src: Oral (03/27 0645) BP: 124/62 mmHg (03/27 0645) Pulse Rate: 66 (03/27 0645) Intake/Output from previous day: 03/26 0701 - 03/27 0700 In: -  Out: 300 [Urine:300] Intake/Output from this shift:    Labs:  Recent Labs  03/24/13 0530 03/25/13 0519  WBC 4.0 5.3  HGB 9.5* 9.6*  PLT 189 206  CREATININE 0.48* 0.52   Estimated Creatinine Clearance: 22.9 ml/min (by C-G formula based on Cr of 0.52). No results found for this basename: VANCOTROUGH, VANCOPEAK, VANCORANDOM, GENTTROUGH, GENTPEAK, GENTRANDOM, TOBRATROUGH, TOBRAPEAK, TOBRARND, AMIKACINPEAK, AMIKACINTROU, AMIKACIN,  in the last 72 hours   Microbiology: Recent Results (from the past 720 hour(s))  CULTURE, BLOOD (ROUTINE X 2)     Status: None   Collection Time    03/23/13  6:55 PM      Result Value Range Status   Specimen Description BLOOD LEFT ARM   Final   Special Requests BOTTLES DRAWN AEROBIC AND ANAEROBIC 5CC   Final   Culture  Setup Time 03/24/2013 02:46   Final   Culture     Final   Value:        BLOOD CULTURE RECEIVED NO GROWTH TO DATE CULTURE WILL BE HELD FOR 5 DAYS BEFORE ISSUING A FINAL NEGATIVE REPORT   Report Status PENDING   Incomplete  CULTURE, BLOOD (ROUTINE X 2)     Status: None   Collection Time    03/23/13  7:02 PM      Result Value Range Status   Specimen Description BLOOD LEFT WRIST   Final   Special Requests BOTTLES DRAWN AEROBIC ONLY 2CC   Final   Culture  Setup Time 03/24/2013 02:46   Final   Culture     Final   Value:        BLOOD CULTURE RECEIVED NO GROWTH TO DATE CULTURE WILL BE HELD FOR 5 DAYS BEFORE ISSUING A FINAL NEGATIVE REPORT   Report Status PENDING   Incomplete  URINE CULTURE      Status: None   Collection Time    03/25/13  1:12 AM      Result Value Range Status   Specimen Description URINE, CLEAN CATCH   Final   Special Requests NONE   Final   Culture  Setup Time 03/25/2013 05:52   Final   Colony Count NO GROWTH   Final   Culture NO GROWTH   Final   Report Status 03/26/2013 FINAL   Final    Medical History: Past Medical History  Diagnosis Date  . GERD (gastroesophageal reflux disease)   . Bladder incontinence   . Neuropathic pain   . RLS (restless legs syndrome)   . Arthritis   . LBBB (left bundle branch block)     Medications:  Scheduled:  . albuterol  2.5 mg Nebulization TID  . calcium carbonate  1,250 mg Oral Daily  . darifenacin  15 mg Oral Daily  . docusate sodium  100 mg Oral BID  . enoxaparin (LOVENOX) injection  30 mg Subcutaneous Q24H  . feeding supplement  1 Container Oral BID BM  . fentaNYL  12.5 mcg Transdermal Q72H  . gabapentin  100 mg Oral Q1400  . gabapentin  200 mg Oral BID  .  ipratropium  0.5 mg Nebulization TID  . multivitamin with minerals  1 tablet Oral Daily  . omega-3 acid ethyl esters  1 g Oral Q1200  . pantoprazole  80 mg Oral Daily  . piperacillin-tazobactam (ZOSYN)  IV  2.25 g Intravenous Q8H  . polyethylene glycol  17 g Oral Daily  . pramipexole  0.5 mg Oral QHS  . predniSONE  50 mg Oral Q breakfast  . sodium chloride  3 mL Intravenous Q12H  . [DISCONTINUED] levofloxacin  500 mg Oral Q48H   Infusions:   PRN: acetaminophen, acetaminophen, albuterol, guaiFENesin-codeine, ondansetron (ZOFRAN) IV, ondansetron, traMADol Assessment: 79 yo F with right lower lobe infiltrate/pneumonia. Evidence of aspiration by review of symptoms. Had been on Zosyn earlier in admission. Changed from                   To restart Zosyn  Goal of Therapy:  Eradication of infection  Plan:  Given this elderly patient's small size and decreased clearance (62ml/min), will proceed with Zosyn 2.25 IV q 8 hours rather than Zosyn 3.375 Gm  EI. Follow up culture results  Kerry Myers 03/26/2013,3:33 PM

## 2013-03-26 NOTE — Progress Notes (Signed)
Pulse ox 79% while ambulating

## 2013-03-26 NOTE — Procedures (Addendum)
Objective Swallowing Evaluation: Modified Barium Swallowing Study  Patient Details  Name: Kerry Myers MRN: [id number] Date of Birth: August 27, 1919  Today's Date: 03/26/2013 Time: 1478-2956 SLP Time Calculation (min): 25 min  Past Medical History:  Past Medical History  Diagnosis Date  . GERD (gastroesophageal reflux disease)   . Bladder incontinence   . Neuropathic pain   . RLS (restless legs syndrome)   . Arthritis   . LBBB (left bundle branch block)    Past Surgical History:  Past Surgical History  Procedure Laterality Date  . Tonsillectomy    . Abdominal hysterectomy      TVH  . Incontinence surgery    . Excision morton's neuroma    . Eye surgery      cataracts  . Amputation  04/30/2012    Procedure: AMPUTATION DIGIT;  Surgeon: Sherri Rad, MD;  Location: [facility];  Service: Orthopedics;  Laterality: Right;  right 2nd toe amptation through MTP joint   HPI:    Pt PMH + for GERD, restless leg syndrome, peripheral neuropathy, and bladder incontinence.   Pt presents with a three-week history of cough, congestion, and shortness of breath. The patient was initially given azithromycin on 03/03/2013 and again on 03/09/2013 when there was no significant improvement. When her symptoms began, the patient had a fever of 100.107F. Since that time, the patient has not had any significant fevers. The patient wears supplemental oxygen 2 L at nighttime, but in the past 2-1/2 weeks, she has had to wear supplemental oxygen 24/7 because of her coughing and shortness of breath. There was no pre-existing diagnosis COPD, but the patient was exposed to significant amount of secondhand smoke from her late husband for at least 30 years. Repeat chest x-ray on 03/20/2013 showed mild persistent right lower lobe opacity. Both of these x-rays suggested hyperinflation.  H/o HH repair noted and previous MBS in 2006 - results not available.   MD ordered swallow evaluation due to concerns pt may have  component of aspiration and pt complaint of dysphagia to toast today.  Pt denies any h/o pna, weight is approximately 94 pounds.  Pt reports she was weak and rested often prior to this admission, she uses oxygen prn at home.  Family and pt desired MBS after discussion from bedside swallow evaluation revealing concerns for aspiration.       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 No aspiration nor penetration present during MBS. Pt observed to cough during testing without barium visualized in larynx/trachea. Oral deficits characterized by mild weakness resulting in delayed oral transit and bolus cohesion as well as minimal oral residuals. Tongue base residuals of pudding noted spilling into pharynx (adhering to tongue base) without pt awareness - as she was talking. Both reflexive and occasional cued dry swallow helped to clear stasis on tongue base, but sensory deficits to residuals may contribute to pt's aspiration risk.   Pharyngeal dysphagia characterized by mild residuals present that mixed with secretions with poor awareness. Again cued dry swallows helpful to minimize stasis. Appearance of prominent CP noted intermittently during MBS with mild episodic stasis of barium that cleared with further swallows. Pt taking pills with applesauce likely compensates for CP issues. Esophageal clearance appeared mildly delayed at times without backflow.  Esophagus appeared tortuous distally and pt is kyphotic, which  may also increase aspiration risk.    Recommend pt continue her diet with strict aspiration and reflux precautions to mitigate aspiration risk. Daughter [name] and pt educated to findings and recommendations.    SLP to follow up x1 to  assure family/pt is completely educated to results/precautions/strategies to mitigate aspiration risk.      Treatment Recommendation                        No follow up beyond this acute setting.   Diet Recommendation Regular;Thin liquid   Liquid Administration via: Cup Medication Administration: Whole meds with puree Supervision: Patient able to self feed Compensations: Slow rate;Small sips/bites;Follow solids with liquid Postural Changes and/or Swallow Maneuvers: Seated upright 90 degrees;Upright 30-60 min after meal    Other  Recommendations Oral Care Recommendations: Oral care QID   Follow Up Recommendations  None    Frequency and Duration min 1 x/week  1 week   Pertinent Vitals/Pain Afebrile, decreased    SLP Swallow Goals Patient will utilize recommended strategies during swallow to increase swallowing safety with: Supervision/safety Goal #3: Pt and family will verbalize results of swallow evaluation, factors coorelating to aspiration risk, and compensatory strategies to mitigate aspiration risk with minimum assist.      General Date of Onset: 03/19/13 HPI:   Pt PMH + for GERD, restless leg syndrome, peripheral neuropathy, and bladder incontinence.   Pt presents with a three-week history of cough, congestion, and shortness of breath. The patient was initially given azithromycin on 03/03/2013 and again on 03/09/2013 when there was no significant improvement. When her symptoms began, the patient had a fever of 100.75F. Since that time, the patient has not had any significant fevers. The patient wears supplemental oxygen 2 L at nighttime, but in the past 2-1/2 weeks, she has had to wear supplemental oxygen 24/7 because of her coughing and shortness of breath. There was no pre-existing diagnosis COPD, but the patient was exposed to significant amount of secondhand smoke from her late husband for at least 30 years. Repeat chest x-ray on 03/20/2013 showed mild persistent right lower lobe  opacity. Both of these x-rays suggested hyperinflation.  H/o HH repair noted and previous MBS in 2006 - results not available.   MD ordered swallow evaluation due to concerns pt may have component of aspiration and pt complaint of dysphagia to toast today.  Pt denies any h/o pna, weight is approximately 94 pounds.  Pt reports she was weak and rested often prior to this admission, she uses oxygen prn at home.  Family and pt desired MBS after discussion from bedside swallow evaluation revealing concerns for aspiration.   Type of Study: Modified Barium Swallowing Study Reason for Referral: Objectively evaluate swallowing function Previous Swallow Assessment: bedside evaluation today, h/o HH Diet Prior to this Study: Regular;Thin liquids Respiratory Status: Room air History of Recent Intubation: No Behavior/Cognition: Alert;Cooperative Oral Cavity - Dentition: Dentures, top;Adequate natural dentition Oral Motor / Sensory Function: Impaired - see Bedside swallow eval Baseline Vocal Quality: Clear Volitional Cough: Strong Volitional Swallow: Able to elicit Anatomy:  (pt is kyphotic)    Reason for Referral Objectively evaluate swallowing function   Oral Phase Oral Preparation/Oral Phase Oral Phase: Impaired Oral - Nectar Oral - Nectar Cup: Weak lingual manipulation;Reduced posterior propulsion;Piecemeal swallowing;Lingual/palatal residue Oral - Thin Oral - Thin Teaspoon: Weak lingual manipulation;Reduced posterior propulsion;Piecemeal swallowing;Lingual/palatal residue Oral - Thin Cup: Weak lingual manipulation;Reduced posterior propulsion;Piecemeal swallowing;Lingual/palatal residue Oral - Thin Straw: Weak lingual manipulation;Reduced  posterior propulsion;Piecemeal swallowing;Lingual/palatal residue Oral - Solids Oral - Puree: Weak lingual manipulation;Reduced posterior propulsion;Piecemeal swallowing;Lingual/palatal residue Oral - Regular: Weak lingual manipulation;Reduced posterior  propulsion;Piecemeal swallowing;Lingual/palatal residue Oral Phase - Comment Oral Phase - Comment: base of tongue resdiuals noted spilling into pharynx without pt sensation- as pt was talking, both cued and reflexive DELAYED swallow cleared   Pharyngeal Phase Pharyngeal Phase Pharyngeal Phase: Impaired Pharyngeal - Nectar Pharyngeal - Nectar Cup: Premature spillage to valleculae;Premature spillage to pyriform sinuses;Reduced pharyngeal peristalsis;Reduced tongue base retraction;Delayed swallow initiation;Pharyngeal residue - cp segment Pharyngeal - Thin Pharyngeal - Thin Cup: Premature spillage to valleculae;Premature spillage to pyriform sinuses;Reduced tongue base retraction;Pharyngeal residue - cp segment Pharyngeal - Thin Straw: Reduced tongue base retraction;Premature spillage to valleculae;Pharyngeal residue - cp segment Pharyngeal - Solids Pharyngeal - Puree: Premature spillage to valleculae Pharyngeal - Regular: Premature spillage to valleculae  Cervical Esophageal Phase    GO    Cervical Esophageal Phase Cervical Esophageal Phase: Impaired Cervical Esophageal Phase - Nectar Nectar Cup: Prominent cricopharyngeal segment;Reduced cricopharyngeal relaxation Cervical Esophageal Phase - Thin Thin Cup: Prominent cricopharyngeal segment;Reduced cricopharyngeal relaxation Thin Straw: Reduced cricopharyngeal relaxation;Prominent cricopharyngeal segment Cervical Esophageal Phase - Solids Puree: Reduced cricopharyngeal relaxation;Prominent cricopharyngeal segment Regular: Prominent cricopharyngeal segment;Reduced cricopharyngeal relaxation Cervical Esophageal Phase - Comment Cervical Esophageal Comment: pt is kyphotic         Donavan Burnet, MS [facility] SLP [phone]

## 2013-03-26 NOTE — Progress Notes (Signed)
SLP Note 1420-1445   MBS completed, Full report to follow. Pt presents with mild oropharyngeal, pharyngoesophageal and suspected esophageal dysphagia (known HH).   No aspiration nor penetration present during MBS.  Pt observed to cough during testing without barium visualized in larynx/trachea.   Oral deficits characterized by mild weakness resulting in delayed oral transit and bolus cohesion as well as minimal oral residuals.  Tongue base residuals of pudding noted spilling into pharynx (adhering to tongue base) without pt awareness - as she was talking.  Both reflexive and occasional cued dry swallow helped to clear stasis on tongue base, but sensory deficits to residuals may contribute to pt's aspiration risk.    Pharyngeal dysphagia characterized by mild residuals present that mixed with secretions with poor awareness.  Again cued dry swallows helpful to minimize stasis.   Appearance of prominent CP noted intermittently during MBS with mild episodic stasis of barium that cleared with further swallows.  Pt taking pills with applesauce likely compensates for CP issues.     Esophageal clearance appeared mildly delayed at times without backflow.   Esophagus appeared tortuous distally and pt is kyphotic, which may also increase aspiration risk.     Recommend pt continue her diet with strict aspiration and reflux precautions to mitigate aspiration risk.   Daughter [name] and pt educated to findings and recommendations.         Donavan Burnet, MS [facility] SLP [phone]

## 2013-03-26 NOTE — Clinical Documentation Improvement (Signed)
RESPIRATORY FAILURE DOCUMENTATION CLARIFICATION QUERY   THIS DOCUMENT IS NOT A PERMANENT PART OF THE MEDICAL RECORD  TO RESPOND TO THE THIS QUERY, FOLLOW THE INSTRUCTIONS BELOW:  1. If needed, update documentation for the patient's encounter via the notes activity.  2. Access this query again and click edit on the In [employer].  3. After updating, or not, click F2 to complete all highlighted (required) fields concerning your review. Select "additional documentation in the medical record" OR "no additional documentation provided".  4. Click Sign note button.  5. The deficiency will fall out of your In Basket *Please let us know if you are not able to complete this workflow by phone or e-mail (listed below).  Please update your documentation within the medical record to reflect your response to this query.                                                                                    03/26/13  Dear Dr. Malachi Bonds Marton Redwood,  In a better effort to capture your patient's severity of illness, reflect appropriate length of stay and utilization of resources, a review of the patient medical record has revealed the following indicators.    Based on your clinical judgment, please clarify and document in a progress note and/or discharge summary the clinical condition associated with the following supporting information:  In responding to this query please exercise your independent judgment.  The fact that a query is asked, does not imply that any particular answer is desired or expected.  Possible Clinical Conditions?  _______Acute Respiratory Failure _______Acute on Chronic Respiratory Failure _______Chronic Respiratory Failure _______Acute Respiratory Insufficiency  _______Other Condition________________ _______Cannot Clinically Determine    Supporting Information:  Risk Factors:  Signs&Symptoms: MD progress note 03/25/13 : "Noted to have pulse ox reading of 78% in ED .Marland Kitchen.  wears  supplemental O2 @2L  at nighttime"                                           Radiology:  Treatment: Oxygen: O2 concentrations:  O2 Mode: ([place], mask, Ventimask, Non rebreather mask, BiPAP, Vent)  Respiratory Treatment:   Treatment: (O2/L/min, Venti mask O2%, Non rebreather mask O2%, BiPAP, Vent)                   You may use possible, probable, or suspect with inpatient documentation. possible, probable, suspected diagnoses MUST be documented at the time of discharge  Reviewed: additional documentation in the medical record  Thank You,  Andy Gauss  Clinical Documentation Specialist:  Pager  Health Information Management [place]

## 2013-03-26 NOTE — Evaluation (Signed)
Clinical/Bedside Swallow Evaluation Patient Details  Name: Kerry Myers MRN: [id number] Date of Birth: [birth year]  Today's Date: 03/26/2013 Time: 1201-1253 SLP Time Calculation (min): 52 min  Past Medical History:  Past Medical History  Diagnosis Date  . GERD (gastroesophageal reflux disease)   . Bladder incontinence   . Neuropathic pain   . RLS (restless legs syndrome)   . Arthritis   . LBBB (left bundle branch block)    Past Surgical History:  Past Surgical History  Procedure Laterality Date  . Tonsillectomy    . Abdominal hysterectomy      TVH  . Incontinence surgery    . Excision morton's neuroma    . Eye surgery      cataracts  . Amputation  04/30/2012    Procedure: AMPUTATION DIGIT;  Surgeon: Sherri Rad, MD;  Location: [facility];  Service: Orthopedics;  Laterality: Right;  right 2nd toe amptation through MTP joint   HPI:    Pt PMH + for GERD, restless leg syndrome, peripheral neuropathy, and bladder incontinence.   Pt presents with a three-week history of cough, congestion, and shortness of breath. The patient was initially given azithromycin on 03/03/2013 and again on 03/09/2013 when there was no significant improvement. When her symptoms began, the patient had a fever of 100.59F. Since that time, the patient has not had any significant fevers. The patient wears supplemental oxygen 2 L at nighttime, but in the past 2-1/2 weeks, she has had to wear supplemental oxygen 24/7 because of her coughing and shortness of breath. There was no pre-existing diagnosis COPD, but the patient was exposed to significant amount of secondhand smoke from her late husband for at least 30 years. Repeat chest x-ray on 03/20/2013 showed mild persistent right lower lobe opacity. Both of these x-rays suggested hyperinflation.  H/o HH repair noted and previous MBS in 2006 - results not available.   MD ordered swallow evaluation due to concerns pt may have component of aspiration and pt  complaint of dysphagia to toast today.  Pt denies any h/o pna, weight is approximately 94 pounds.  Pt reports she was weak and rested often prior to this admission, she uses oxygen prn at home.     Assessment / Plan / Recommendation Clinical Impression  Pt presents with clinical indications of multifactorial dysphagia (including cervical esophageal issues) and possible aspiration.  Dysphagia characterized by audible swallow, decr laryngeal elevation and delayed weak cough/throat clearing after swallowing - most notably with thin liquids.  Pt with cough at baseline that is exacerbated with po.  Daughter admits to pt "strangling" on liquids/water at times.  Suspect component of mild chronic dysphagia - possible low grade aspiration for which she manages when well.     MBS may add to clinical picture of pharyngeal swallow ability and when presented with options of deferring test to see if pt improves at home, family/pt desired to proceed with MBS.  Order received, will conduct.  DIscussed with pt/daughter to multiple risk factors for asp pna and comorbidities, as well as suspicion of low grade chronic aspiration.   Both pt and daughter [name] verbalized understanding to information provided.       Aspiration Risk  Moderate    Diet Recommendation  (reg/thin until test completed)        Other  Recommendations Recommended Consults: MBS   Follow Up Recommendations       Frequency and Duration        Pertinent Vitals/Pain Afebrile, decreased,  weak cough    SLP Swallow Goals Patient will consume recommended diet without observed clinical signs of aspiration with:  (pending mbs)   Swallow Study Prior Functional Status   son lives with pt, caregiver present for five hours, feeds self regular diet    General Date of Onset: 03/19/13 HPI:   Pt PMH + for GERD, restless leg syndrome, peripheral neuropathy, and bladder incontinence.   Pt presents with a three-week history of cough, congestion, and  shortness of breath. The patient was initially given azithromycin on 03/03/2013 and again on 03/09/2013 when there was no significant improvement. When her symptoms began, the patient had a fever of 100.10F. Since that time, the patient has not had any significant fevers. The patient wears supplemental oxygen 2 L at nighttime, but in the past 2-1/2 weeks, she has had to wear supplemental oxygen 24/7 because of her coughing and shortness of breath. There was no pre-existing diagnosis COPD, but the patient was exposed to significant amount of secondhand smoke from her late husband for at least 30 years. Repeat chest x-ray on 03/20/2013 showed mild persistent right lower lobe opacity. Both of these x-rays suggested hyperinflation.  H/o HH repair noted and previous MBS in 2006 - results not available.   MD ordered swallow evaluation due to concerns pt may have component of aspiration and pt complaint of dysphagia to toast today.  Pt denies any h/o pna, weight is approximately 94 pounds.  Pt reports she was weak and rested often prior to this admission, she uses oxygen prn at home.   Type of Study: Bedside swallow evaluation Diet Prior to this Study: Regular;Thin liquids Respiratory Status: Room air History of Recent Intubation: No Behavior/Cognition: Alert;Cooperative Oral Cavity - Dentition: Dentures, top;Adequate natural dentition Self-Feeding Abilities: Able to feed self Patient Positioning: Upright in chair Baseline Vocal Quality: Clear Volitional Cough: Strong Volitional Swallow: Able to elicit    Oral/Motor/Sensory Function Overall Oral Motor/Sensory Function: Appears within functional limits for tasks assessed Facial ROM: Reduced left Velum:  (pulls to right upon phonation)   Ice Chips Ice chips: Not tested   Thin Liquid Thin Liquid: Impaired Presentation: Self Fed;Cup;Straw Oral Phase Impairments: Reduced lingual movement/coordination Pharyngeal  Phase Impairments: Suspected delayed  Swallow;Decreased hyoid-laryngeal movement;Multiple swallows;Throat Clearing - Immediate;Cough - Delayed Other Comments: pt with audible swallow, ? component of cp dysfunction?    Nectar Thick Nectar Thick Liquid: Not tested   Honey Thick Honey Thick Liquid: Not tested   Puree Puree: Not tested   Solid   GO    Solid: Impaired Presentation: Self Fed Oral Phase Impairments: Reduced lingual movement/coordination Pharyngeal Phase Impairments: Suspected delayed Swallow;Decreased hyoid-laryngeal movement;Throat Clearing - Immediate       Donavan Burnet, MS [facility] SLP [phone]

## 2013-03-26 NOTE — Progress Notes (Signed)
[facility] PROGRESS NOTE  Kerry Myers ZOX:[id number] DOB: 05-15-1919 DOA: 03/23/2013 PCP: Kerry Freud, DO   Assessment/Plan: Right lower lobe infiltrate/pneumonia with acute on chronic respiratory failure.  Clinically improving, however, still requiring 2L O2.  Breathing treatments have been helping her cough and SOB.  Patient does have evidence of aspiration on review of symptoms and by speech therapy evaluation.  Most likely, her outpatient failure of treatment was because azithromycin and FQ are not first line treatment for OP flora/aspiration pneumonia.   -  CTA chest negative for PE -  D/C FQ and restart zosyn  -  Blood cultures x2 sets NGTD -  Urine Legionella antigen neg -  urine Streptococcus pneumoniae antigen neg - continue albuterol and add ipratropium -  Continue Nakshatra Klose course of steroids 60mg  on 3/26, then 50mg  on 3/27, etc. -  Wean daytime oxygen as tolerated. -  Walking pulse ox:  Desaturated to 70s with exertion on room air.  Repeat on oxygen  generalized weakness  -UA neg and urine culture neg -Check TSH--0.866 -Likely due to deconditioning and PNA -EKG--sinus, unchanged LAFB  Gastroesophageal reflux disease  -Continue proton pump inhibitor   Peripheral neuropathy  -Continue gabapentin, fentanyl patch, tramadol   Dysphagia:  -  Speech therapy evaluation -  MBS pending  CODE STATUS  -Full code, verified with the patient's son  Family Communication:   Daughter  at beside Disposition Plan:   Home when medically stable   Antibiotics:  Zosyn 03/23/13>>> 3/26, 3/27 >>  Plan to transition to augmentin at discharge  Levofloxacin  03/23/13>>> 3/27   Procedures/Studies: Dg Chest 2 View  03/20/2013  *RADIOLOGY REPORT*  Clinical Data: Pneumonia.  CHEST - 2 VIEW  Comparison: 03/07/2013.  Findings: The heart is borderline enlarged but stable.  The mediastinal and hilar contours are stable.  A hiatal hernia is again demonstrated.  There is persistent  density right lung base which could reflect infiltrate.  IMPRESSION: Persistent right basilar density could reflect an infiltrate.   Original Report Authenticated By: Rudie Meyer, M.D.    Dg Chest 2 View  03/07/2013  *RADIOLOGY REPORT*  Clinical Data: [name] of breath, cough  CHEST - 2 VIEW  Comparison: Prior chest x-ray 03/03/2013  Findings: Slightly increased patchy opacity in the right base. Otherwise, similar appearance of hyperexpansion and central bronchitic changes consistent with underlying COPD.  Moderate sized hiatal hernia.  Cardiac and mediastinal contours remain within normal limits.  The bones are diffusely osteopenic.  There is exaggerated thoracic kyphosis with probable multiple compression fractures.  No acute osseous abnormality identified.  IMPRESSION:  Developing patchy opacity in the right lower lobe may represent infiltrate, or possibly a small amount of aspiration.  Background changes of COPD.  Moderate hiatal hernia.   Original Report Authenticated By: Malachy Moan, M.D.    Dg Chest 2 View  03/03/2013  *RADIOLOGY REPORT*  Clinical Data: History of coughing.  Shortness of breath.  CHEST - 2 VIEW  Comparison: 02/23/2009.  Findings: There is borderline cardiac silhouette enlargement. Ectasia, calcification, and tortuosity of the descending aorta is seen.  There is scoliosis convexity to the left with slight rotation of the mediastinum.  Left hilar prominence most likely is vascular.  There is a moderately large hiatal hernia in the retrocardiac region.  There is osteopenic appearance of the bones. Changes of degenerative disc disease and degenerative spondylosis are seen.  There is slight loss of height involving the upper mid thoracic vertebral body.  No pleural effusion is  seen.  IMPRESSION: Borderline cardiac silhouette enlargement.  Scoliosis distorts the mediastinal structures.  The prominent left hilum is probably stable and is probably vascular.  Moderately large hiatal hernia. No  pulmonary edema, consolidation, or pleural effusion.  Chronic bony abnormalities are detailed above.   Original Report Authenticated By: Onalee Hua Call    Ct Angio Chest W/cm &/or Wo Cm  03/23/2013  *RADIOLOGY REPORT*  Clinical Data: Cough for 3 weeks, history of pulmonary hypertension  CT ANGIOGRAPHY CHEST  Technique:  Multidetector CT imaging of the chest using the standard protocol during bolus administration of intravenous contrast. Multiplanar reconstructed images including MIPs were obtained and reviewed to evaluate the vascular anatomy.  Contrast: 80mL OMNIPAQUE IOHEXOL 350 MG/ML SOLN  Comparison: None.  Findings: Scattered atherosclerotic calcifications aorta and coronary arteries. No aortic aneurysm or dissection. Pulmonary arteries well opacified and patent. No evidence of pulmonary embolism. Large hiatal hernia. Elevation of right diaphragm with right basilar atelectasis. Mild patchy infiltrate in right lower lobe and right middle lobe. Tiny bibasilar pleural effusions. No pneumothorax. Visualized portion upper abdomen unremarkable.  IMPRESSION: No evidence of pulmonary embolism. Right basilar atelectasis with mild patchy infiltrate in right middle and lower lobes. Tiny bibasilar pleural effusions. Large hiatal hernia.   Original Report Authenticated By: Ulyses Southward, M.D.       Subjective: Patient feels that the cough is better and she feels less SOB.  No hemoptyisis.  Denies any fevers, chills, nausea, vomiting, diarrhea, abdominal pain. No dysuria or hematuria.  Was able to ambulate down hall with PT.    Objective: Filed Vitals:   03/25/13 2047 03/25/13 2054 03/26/13 0645 03/26/13 0811  BP: 141/73  124/62   Pulse: 73 89 66   Temp: 98 F (36.7 C)  97.6 F (36.4 C)   TempSrc: Oral  Oral   Resp: 18 18 18    Height:      Weight:      SpO2: 99% 9% 100% 99%   No intake or output data in the 24 hours ending 03/26/13 1506 Weight change:  Exam:   General:  Caucasian female, thin, no in  acute distress, hearing impairment  HEENT: No icterus, No thrush, No neck mass, [place]/AT  Cardiovascular: RRR, S1/S2, no rubs, no gallops  Respiratory: Diminished BS at the bilateral bases. No wheezes or rhonchi. Good air movement.  Abdomen:  Soft/+BS, non tender, non distended, no guarding  Extremities: No edema, No lymphangitis, No petechiae, No rashes, no synovitis  Data Reviewed: Basic Metabolic Panel:  Recent Labs Lab 03/23/13 1150 03/24/13 0530 03/25/13 0519  NA 144 139 142  K 3.9 3.7 3.5  CL 105 104 107  CO2 31 30 30   GLUCOSE 83 109* 104*  BUN 20 14 11   CREATININE 0.50 0.48* 0.52  CALCIUM 9.2 8.4 8.4   Liver Function Tests:  Recent Labs Lab 03/23/13 1150  AST 24  ALT 13  ALKPHOS 60  BILITOT 0.3  PROT 6.6  ALBUMIN 2.9*   No results found for this basename: LIPASE, AMYLASE,  in the last 168 hours No results found for this basename: AMMONIA,  in the last 168 hours CBC:  Recent Labs Lab 03/23/13 1150 03/24/13 0530 03/25/13 0519  WBC 6.5 4.0 5.3  NEUTROABS 4.2  --   --   HGB 10.3* 9.5* 9.6*  HCT 31.9* 28.8* 29.2*  MCV 97.9 96.3 95.7  PLT 207 189 206   Cardiac Enzymes: No results found for this basename: CKTOTAL, CKMB, CKMBINDEX, TROPONINI,  in the last  168 hours BNP: No components found with this basename: POCBNP,  CBG: No results found for this basename: GLUCAP,  in the last 168 hours  Recent Results (from the past 240 hour(s))  CULTURE, BLOOD (ROUTINE X 2)     Status: None   Collection Time    03/23/13  6:55 PM      Result Value Range Status   Specimen Description BLOOD LEFT ARM   Final   Special Requests BOTTLES DRAWN AEROBIC AND ANAEROBIC 5CC   Final   Culture  Setup Time 03/24/2013 02:46   Final   Culture     Final   Value:        BLOOD CULTURE RECEIVED NO GROWTH TO DATE CULTURE WILL BE HELD FOR 5 DAYS BEFORE ISSUING A FINAL NEGATIVE REPORT   Report Status PENDING   Incomplete  CULTURE, BLOOD (ROUTINE X 2)     Status: None   Collection  Time    03/23/13  7:02 PM      Result Value Range Status   Specimen Description BLOOD LEFT WRIST   Final   Special Requests BOTTLES DRAWN AEROBIC ONLY 2CC   Final   Culture  Setup Time 03/24/2013 02:46   Final   Culture     Final   Value:        BLOOD CULTURE RECEIVED NO GROWTH TO DATE CULTURE WILL BE HELD FOR 5 DAYS BEFORE ISSUING A FINAL NEGATIVE REPORT   Report Status PENDING   Incomplete  URINE CULTURE     Status: None   Collection Time    03/25/13  1:12 AM      Result Value Range Status   Specimen Description URINE, CLEAN CATCH   Final   Special Requests NONE   Final   Culture  Setup Time 03/25/2013 05:52   Final   [place] Count NO GROWTH   Final   Culture NO GROWTH   Final   Report Status 03/26/2013 FINAL   Final     Scheduled Meds: . albuterol  2.5 mg Nebulization TID  . calcium carbonate  1,250 mg Oral Daily  . darifenacin  15 mg Oral Daily  . docusate sodium  100 mg Oral BID  . enoxaparin (LOVENOX) injection  30 mg Subcutaneous Q24H  . feeding supplement  1 Container Oral BID BM  . fentaNYL  12.5 mcg Transdermal Q72H  . gabapentin  100 mg Oral Q1400  . gabapentin  200 mg Oral BID  . ipratropium  0.5 mg Nebulization TID  . levofloxacin  500 mg Oral Q48H  . multivitamin with minerals  1 tablet Oral Daily  . omega-3 acid ethyl esters  1 g Oral Q1200  . pantoprazole  80 mg Oral Daily  . polyethylene glycol  17 g Oral Daily  . pramipexole  0.5 mg Oral QHS  . predniSONE  50 mg Oral Q breakfast  . sodium chloride  3 mL Intravenous Q12H   Continuous Infusions:     Renae Fickle, MD  [facility] Pager [phone]  If 7PM-7AM, please contact night-coverage [email or web address] Password [email or web address] 03/26/2013, 3:06 PM   LOS: 3 days

## 2013-03-26 NOTE — Progress Notes (Signed)
Stopped in to see patient today. She was in good spirits. Offered a prayer for her continuing recovery and offered support to her.  She was happy to have another visit with my service dog.

## 2013-03-26 NOTE — Progress Notes (Signed)
SLP to pt's room to post swallow precaution sign, re-educate pt and pt's daughter Kerry Myers to clinical reasoning for compensatory strategies.   SLP to follow briefly for education/tolerance.  Kerry Myers and pt reiterated swallow precautions and purpose. Donavan Burnet, MS [facility] SLP [phone]

## 2013-03-27 DIAGNOSIS — R1312 Dysphagia, oropharyngeal phase: Secondary | ICD-10-CM

## 2013-03-27 MED ORDER — BENZONATATE 100 MG PO CAPS
100.0000 mg | ORAL_CAPSULE | Freq: Three times a day (TID) | ORAL | Status: DC
  Administered 2013-03-27: 100 mg via ORAL
  Filled 2013-03-27 (×2): qty 1

## 2013-03-27 MED ORDER — PREDNISONE 10 MG PO TABS: ORAL_TABLET | ORAL | Status: DC

## 2013-03-27 MED ORDER — PREDNISONE 20 MG PO TABS
40.0000 mg | ORAL_TABLET | Freq: Every day | ORAL | Status: DC
  Filled 2013-03-27: qty 2

## 2013-03-27 MED ORDER — AMOXICILLIN-POT CLAVULANATE 875-125 MG PO TABS
1.0000 | ORAL_TABLET | Freq: Two times a day (BID) | ORAL | Status: DC
  Administered 2013-03-27: 1 via ORAL
  Filled 2013-03-27 (×4): qty 1

## 2013-03-27 MED ORDER — ALBUTEROL SULFATE HFA 108 (90 BASE) MCG/ACT IN AERS: 2.0000 | INHALATION_SPRAY | Freq: Four times a day (QID) | RESPIRATORY_TRACT | Status: AC | PRN

## 2013-03-27 MED ORDER — AMOXICILLIN-POT CLAVULANATE 875-125 MG PO TABS: 1.0000 | ORAL_TABLET | Freq: Two times a day (BID) | ORAL | Status: DC

## 2013-03-27 MED ORDER — BOOST / RESOURCE BREEZE PO LIQD: 1.0000 | Freq: Two times a day (BID) | ORAL | Status: DC

## 2013-03-27 MED ORDER — BENZONATATE 100 MG PO CAPS: 100.0000 mg | ORAL_CAPSULE | Freq: Three times a day (TID) | ORAL | Status: DC | PRN

## 2013-03-27 NOTE — Progress Notes (Signed)
Foam dressing applied to right foot and back as ordered.

## 2013-03-27 NOTE — Progress Notes (Signed)
Patients daughter requested that WOC nurse see patient about 1cm x 1cm skin tare on right foot, I cleaned and covered with pink foam, will continue to monitor

## 2013-03-27 NOTE — Progress Notes (Signed)
Iv site unable to flush, removed and spoke with Dr Malachi Bonds, states ok to leave iv out.  Family  And pt informed.

## 2013-03-27 NOTE — Evaluation (Signed)
Physical Therapy Evaluation Patient Details Name: Kerry Myers MRN: [id number] DOB: 11/14/1919 Today's Date: 03/27/2013 Time: 0902-0940 PT Time Calculation (min): 38 min  PT Assessment / Plan / Recommendation Clinical Impression  79 y.o. female who lives with son and has 24* assist at home admitted with PNA. She walked 150' x 2 with RW and min A. HHPT recommended. She would benefit from acute PT to maximize safety and independence with mobility.     PT Assessment  Patient needs continued PT services    Follow Up Recommendations  Home health PT    Does the patient have the potential to tolerate intense rehabilitation      Barriers to Discharge None      Equipment Recommendations  None recommended by PT    Recommendations for Other Services     Frequency Min 3X/week    Precautions / Restrictions Precautions Precautions: Fall Restrictions Weight Bearing Restrictions: No   Pertinent Vitals/Pain **SaO2 91% on RA walking, HR 67 0/10 pain*      Mobility  Bed Mobility Bed Mobility: Not assessed Transfers Transfers: Sit to Stand;Stand to Sit Sit to Stand: 3: Mod assist Stand to Sit: 4: Min guard Details for Transfer Assistance: assist to rise, VCs hand placement Ambulation/Gait Ambulation/Gait Assistance: 4: Min guard Ambulation Distance (Feet): 300 Feet (150'x2 with seated rest break of 4') Assistive device: Rolling walker Ambulation/Gait Assistance Details: min/guard assist for safety, no LOB Gait Pattern: Step-through pattern Gait velocity: mildly decreased    Exercises     PT Diagnosis: Generalized weakness  PT Problem List: Decreased mobility;Decreased activity tolerance PT Treatment Interventions: Gait training;Functional mobility training;Therapeutic exercise;Patient/family education   PT Goals Acute Rehab PT Goals PT Goal Formulation: With patient/family Time For Goal Achievement: 04/10/13 Potential to Achieve Goals: Good Pt will go Supine/Side to Sit:  Independently PT Goal: Supine/Side to Sit - Progress: Goal set today Pt will go Sit to Stand: with supervision PT Goal: Sit to Stand - Progress: Goal set today Pt will Ambulate: >150 feet;with supervision;with rolling walker PT Goal: Ambulate - Progress: Goal set today Pt will Perform Home Exercise Program: Independently PT Goal: Perform Home Exercise Program - Progress: Goal set today  Visit Information  Last PT Received On: 03/27/13 Assistance Needed: +1    Subjective Data  Subjective: I think I'm running out of energy. (while walking) Patient Stated Goal: to get some rest at home   Prior Functioning  Home Living Lives With: Son Available Help at Discharge: Family;Personal care attendant Type of Home: House Home Access: Ramped entrance Home Layout: Two level;Able to live on main level with bedroom/bathroom Alternate Level Stairs-Number of Steps: flight to basement, but pt doesn't use Bathroom Toilet: Standard Home Adaptive Equipment: Tub transfer bench;Walker - rolling;Bedside commode/3-in-1;Wheelchair - manual;Straight cane Additional Comments: lives with son, has aide daily bc son is disabled Prior Function Level of Independence: Needs assistance Needs Assistance: Bathing;Dressing Driving: No Communication Communication: Surveyor, mining Overall Cognitive Status: Appears within functional limits for tasks assessed/performed Arousal/Alertness: Awake/alert Orientation Level: Appears intact for tasks assessed Behavior During Session: [facility] for tasks performed    Extremity/Trunk Assessment Right Upper Extremity Assessment RUE ROM/Strength/Tone: [facility] for tasks assessed Left Upper Extremity Assessment LUE ROM/Strength/Tone: WFL for tasks assessed Right Lower Extremity Assessment RLE ROM/Strength/Tone: Within functional levels RLE Sensation: WFL - Light Touch RLE Coordination: WFL - gross/fine motor Left Lower Extremity Assessment LLE ROM/Strength/Tone: Within  functional levels LLE Sensation: WFL - Light Touch LLE Coordination: WFL -  gross/fine motor Trunk Assessment Trunk Assessment: Kyphotic (scoliosis)   Balance    End of Session PT - End of Session Equipment Utilized During Treatment: Gait belt Activity Tolerance: Patient tolerated treatment well;Patient limited by fatigue Patient left: in chair;with call bell/phone within reach;with family/visitor present Nurse Communication: Mobility status  GP     Ralene Bathe Kistler 03/27/2013, 11:14 AM [phone]

## 2013-03-27 NOTE — Progress Notes (Signed)
Speech Language Pathology Dysphagia Treatment Patient Details Name: Kerry Myers MRN: [id number] DOB: 19-Sep-1919 Today's Date: 03/27/2013 Time: 4098-1191 SLP Time Calculation (min): 33 min  Assessment / Plan / Recommendation Clinical Impression  Skilled dysphagia tx completed incorporating education of MBS results, compensatory strategies, dysphagia exercises- lingual to palate press and effortful swallow, fluctuations in swallow abiity and ongoing aspiration risk.  Pt observed drinking water via straw with consistent reflexive cough - of note, pt did not cough during her MBS unfortunately, but suspect ongoing aspiration present.  Pt required maximum verba/visual/tactile cues initially fading to moderate cues for exercise completion at end of session.  Provided pt, daughter Kerry Myers with exercises, precautions, xerostomia tips in writing and verbally using teach back assure comprehension.  SLP asked for pt to have someone available for her at all times when she is eating/drinking due to aspiration risk.  No follow up indicated as all eduation completed.  Thanks for the pleasure of working with this pt and her family.      Diet Recommendation    Regular/thin with strict precautions   SLP Plan All goals met   Pertinent Vitals/Pain Afebrile,decreased, pt continues with cough   Swallowing Goals  SLP Swallowing Goals Swallow Study Goal #2 - Progress: Met Swallow Study Goal #3 - Progress: Met Goal #4: Pt will complete exercises to facilitate lingual/pharyngeal strength with mod verbal/tactile cues.   Swallow Study Goal #4 - Progress: Met  General Respiratory Status: Room air Behavior/Cognition: Alert;Cooperative Oral Cavity - Dentition: Dentures, top;Adequate natural dentition Patient Positioning: Upright in chair  Oral Cavity - Oral Hygiene   oral cavity clear, pt has not consumed breakfast as of yet  Dysphagia Treatment Treatment focused on: Facilitation of pharyngeal phase;Facilitation  of oral phase Treatment Methods/Modalities: Effortful swallow (lingual to palate press) Patient observed directly with PO's: Yes Type of PO's observed: Thin liquids Liquids provided via: Straw Pharyngeal Phase Signs & Symptoms: Immediate cough;Multiple swallows Type of cueing: Verbal;Tactile;Visual Amount of cueing: Moderate   GO     Donavan Burnet, MS [facility] SLP [phone]

## 2013-03-27 NOTE — Progress Notes (Signed)
Voices understanding of d/c instructions no changes noted since am assessment.  D/c home vis wheelchair with daughter.

## 2013-03-27 NOTE — Evaluation (Signed)
Occupational Therapy Evaluation Patient Details Name: Kerry Myers MRN: [id number] DOB: May 14, 1919 Today's Date: 03/27/2013 Time: 4098-1191 OT Time Calculation (min): 32 min  OT Assessment / Plan / Recommendation Clinical Impression  Pt is a 79 yo female admitted for pneumonia who has a lot of assist from an aid at home and has deficits listed below.  Pt would benefit from OT to increase I with her basic adl transfers to ensure her safety d/cing home with her son.    OT Assessment  Patient needs continued OT Services    Follow Up Recommendations  Home health OT    Barriers to Discharge None    Equipment Recommendations  None recommended by OT    Recommendations for Other Services    Frequency  Min 2X/week    Precautions / Restrictions Precautions Precautions: Fall Restrictions Weight Bearing Restrictions: No   Pertinent Vitals/Pain Pt with no reports of pain.  Pt O2 sats remained 94% and above for entire OT session on RA.  Spoke with nursing and O2 left off at this time.  Nursing to follow up with repeat O2 checks.    ADL  Eating/Feeding: Performed;Set up Where Assessed - Eating/Feeding: Chair Grooming: Wash/dry hands;Wash/dry face;Supervision/safety Where Assessed - Grooming: Supported standing Upper Body Bathing: Simulated;Set up Where Assessed - Upper Body Bathing: Unsupported sitting Lower Body Bathing: Simulated;Moderate assistance Where Assessed - Lower Body Bathing: Supported sit to stand Upper Body Dressing: Simulated;Minimal assistance Where Assessed - Upper Body Dressing: Unsupported sitting Lower Body Dressing: Simulated;Moderate assistance Where Assessed - Lower Body Dressing: Supported sit to Pharmacist, hospital: Performed;Minimal assistance Toilet Transfer Method: Other (comment) (ambulated to br) Toilet Transfer Equipment: Raised toilet seat with arms (or 3-in-1 over toilet);Grab bars Toileting - Clothing Manipulation and Hygiene: Performed;Minimal  assistance Where Assessed - Engineer, mining and Hygiene: Standing Equipment Used: Rolling walker Transfers/Ambulation Related to ADLs: Pt walked to br with min guard.  Cues to keep walker closer to her body.   ADL Comments: Pt has a lot of help at home with bathing and dressing tx focused more on adl transfers and toileting.    OT Diagnosis: Generalized weakness  OT Problem List: Decreased strength;Decreased range of motion;Impaired balance (sitting and/or standing);Impaired UE functional use OT Treatment Interventions: Self-care/ADL training;Therapeutic activities   OT Goals Acute Rehab OT Goals OT Goal Formulation: With patient/family Time For Goal Achievement: 04/10/13 Potential to Achieve Goals: Good ADL Goals Pt Will Perform Grooming: with supervision;Standing at sink ADL Goal: Grooming - Progress: Goal set today Pt Will Transfer to Toilet: with supervision;3-in-1 ADL Goal: Toilet Transfer - Progress: Goal set today Pt Will Perform Toileting - Clothing Manipulation: with supervision;Standing ADL Goal: Toileting - Clothing Manipulation - Progress: Goal set today Pt Will Perform Toileting - Hygiene: with supervision;Sit to stand from 3-in-1/toilet ADL Goal: Toileting - Hygiene - Progress: Goal set today Pt Will Perform Tub/Shower Transfer: Tub transfer;Transfer tub bench;with supervision ADL Goal: Tub/Shower Transfer - Progress: Goal set today  Visit Information  Last OT Received On: 03/27/13 Assistance Needed: +1    Subjective Data  Subjective: " I have a helper at home." Patient Stated Goal: to go home today.   Prior Functioning     Home Living Lives With: Son Available Help at Discharge: Family;Personal care attendant Type of Home: House Home Access: Ramped entrance Home Layout: Two level;Able to live on main level with bedroom/bathroom Alternate Level Stairs-Number of Steps: flight to basement, but pt doesn't use Bathroom Shower/Tub: Tub/shower  unit;Curtain Firefighter:  Standard Home Adaptive Equipment: Tub transfer bench;Walker - rolling;Bedside commode/3-in-1;Wheelchair - manual;Straight cane Additional Comments: lives with son, has aide daily bc son is disabled Prior Function Level of Independence: Needs assistance Needs Assistance: Bathing;Dressing;Toileting;Meal Prep;Light Housekeeping Bath: Moderate Dressing: Moderate Toileting: Minimal Meal Prep: Total Light Housekeeping: Total Able to Take Stairs?: No Driving: No Vocation: Retired Musician: HOH Dominant Hand: Right         Vision/Perception Vision - History Baseline Vision: Wears glasses all the time Vision - Assessment Vision Assessment: Vision not tested   Cognition  Cognition Overall Cognitive Status: Appears within functional limits for tasks assessed/performed Arousal/Alertness: Awake/alert Orientation Level: Appears intact for tasks assessed Behavior During Session: [facility] for tasks performed Cognition - Other Comments: Pt with mild memory deficits but very very functional.    Extremity/Trunk Assessment Right Upper Extremity Assessment RUE ROM/Strength/Tone: Deficits RUE ROM/Strength/Tone Deficits: pt with very limited shoulder ROM due to arthritis.  Pt also with some limited hand movement due to arthritis but is functinoal. RUE Sensation: WFL - Light Touch RUE Coordination: WFL - gross/fine motor Left Upper Extremity Assessment LUE ROM/Strength/Tone: WFL for tasks assessed LUE Sensation: WFL - Light Touch LUE Coordination: WFL - gross/fine motor Right Lower Extremity Assessment RLE ROM/Strength/Tone: Within functional levels RLE Sensation: WFL - Light Touch RLE Coordination: WFL - gross/fine motor Left Lower Extremity Assessment LLE ROM/Strength/Tone: Within functional levels LLE Sensation: WFL - Light Touch LLE Coordination: WFL - gross/fine motor Trunk Assessment Trunk Assessment: Kyphotic     Mobility Bed  Mobility Bed Mobility: Not assessed Transfers Transfers: Sit to Stand;Stand to Sit Sit to Stand: 4: Min assist Stand to Sit: 4: Min guard Details for Transfer Assistance: cues for safe hand placement     Exercise     Balance Balance Balance Assessed: No   End of Session OT - End of Session Activity Tolerance: Patient tolerated treatment well Patient left: in chair;with call bell/phone within reach;with family/visitor present Nurse Communication: Mobility status  GO     Hope Budds 03/27/2013, 12:11 PM [phone]

## 2013-03-27 NOTE — Consult Note (Signed)
WOC consult Note Reason for Consult:dorsal aspect of foot wound, chronic, healing. Patient is seen and followed by the outpatient wound care center at [facility]. She should see them post discharge for continuation of care. Wound type:traumatic Pressure Ulcer POA: No Measurement:1cm x .2cm x .2cm Wound EAV:WUJWJ dry Drainage (amount, consistency, odor) none Periwound:intact Dressing procedure/placement/frequency:Today I removed a small piece of silver Hydrofiber from the wound and I think that while this may have been appropriate in the past, it is no longer needed.  Wound is clean, dry and I am afraid that the wound edges will more easily join together if a moist wound bed is maintained.  I will discontinue the silver and continue the soft silicone foam dressing with twice weekly changes until reepithelialization occurs. I will not follow.  Please re-consult if needed. Thanks, Ladona Mow, MSN, RN, [facility], CWOCN [phone])

## 2013-03-30 LAB — CULTURE, BLOOD (ROUTINE X 2): Culture: NO GROWTH

## 2013-04-01 ENCOUNTER — Telehealth: Payer: Self-pay | Admitting: Family Medicine

## 2013-04-01 NOTE — Telephone Encounter (Signed)
Caller: La[place]/Child; Phone: [phone]; Reason for Call: Pt's daughter would like Dr Laury Axon to be aware of several issues that need to discussed during office visit most important is the urinary incontinence, bowel incontinence since discharge from hospital.  Pt's daughter feels pt may not discuss this if her son is in the room.  PLEASE F/U WITH PT'S DAUGHTER WITH QUESTIONS.  THANK YOU.

## 2013-04-02 ENCOUNTER — Encounter: Payer: Self-pay | Admitting: Family Medicine

## 2013-04-02 ENCOUNTER — Ambulatory Visit (INDEPENDENT_AMBULATORY_CARE_PROVIDER_SITE_OTHER): Payer: Medicare Other | Admitting: Family Medicine

## 2013-04-02 VITALS — BP 102/58 | HR 98 | Temp 97.7°F | Wt 83.2 lb

## 2013-04-02 DIAGNOSIS — R531 Weakness: Secondary | ICD-10-CM

## 2013-04-02 DIAGNOSIS — J690 Pneumonitis due to inhalation of food and vomit: Secondary | ICD-10-CM

## 2013-04-02 DIAGNOSIS — J189 Pneumonia, unspecified organism: Secondary | ICD-10-CM

## 2013-04-02 DIAGNOSIS — R5383 Other fatigue: Secondary | ICD-10-CM

## 2013-04-02 DIAGNOSIS — R32 Unspecified urinary incontinence: Secondary | ICD-10-CM

## 2013-04-02 LAB — POCT URINALYSIS DIPSTICK
Bilirubin, UA: NEGATIVE
Blood, UA: NEGATIVE
Glucose, UA: NEGATIVE
Ketones, UA: NEGATIVE
Leukocytes, UA: NEGATIVE
Nitrite, UA: NEGATIVE
Protein, UA: NEGATIVE
Spec Grav, UA: 1.015
Urobilinogen, UA: 0.2
pH, UA: 6

## 2013-04-02 NOTE — Assessment & Plan Note (Signed)
Resolved Finish meds

## 2013-04-02 NOTE — Progress Notes (Signed)
  Subjective:    Patient ID: Kerry Myers, female    DOB: Mar 04, 1919, 79 y.o.   MRN: [id number]  HPI Pt here with son and caretaker to f/u aspiration pneumonia.  She is still weak but is doing much better.  Her daugher Everlene Balls called to saw she was worried about leakage of urine and bowel but caretaker states she has not had any trouble since Monday.  She is not eating well.     Review of Systems As above       Objective:   Physical Exam  BP 102/58  Pulse 98  Temp(Src) 97.7 F (36.5 C) (Oral)  Wt 83 lb 3.2 oz (37.739 kg)  BMI 20.05 kg/m2  SpO2 90% General appearance: alert, cooperative, appears stated age and no distress Neck: no adenopathy, supple, symmetrical, trachea midline and thyroid not enlarged, symmetric, no tenderness/mass/nodules Lungs: clear to auscultation bilaterally Heart: S1, S2 normal       Assessment & Plan:

## 2013-04-02 NOTE — Patient Instructions (Signed)

## 2013-04-02 NOTE — Telephone Encounter (Signed)
Spoke with [place] and she stated the mother was having Urinary incontinence and wanted to know if the medication was still working. She also stated on Monday night she is having bowel and bladder issues. She had not been taking the Miralax because of loose bowels. She can not stop her Miralax because due to her previous bowel surgery. She has been wetting herself and it is hard to change her clothes when an attendant is not available. The family is concerned about the incontinence both ways.

## 2013-04-02 NOTE — Assessment & Plan Note (Addendum)
Pt to drink boost/ ensure bid  rto 3 months

## 2013-04-03 ENCOUNTER — Encounter (HOSPITAL_BASED_OUTPATIENT_CLINIC_OR_DEPARTMENT_OTHER): Payer: Medicare Other | Attending: General Surgery

## 2013-04-03 DIAGNOSIS — L97509 Non-pressure chronic ulcer of other part of unspecified foot with unspecified severity: Secondary | ICD-10-CM | POA: Insufficient documentation

## 2013-04-03 DIAGNOSIS — G609 Hereditary and idiopathic neuropathy, unspecified: Secondary | ICD-10-CM | POA: Insufficient documentation

## 2013-04-16 ENCOUNTER — Other Ambulatory Visit: Payer: Self-pay

## 2013-04-16 MED ORDER — TRAMADOL HCL 50 MG PO TABS: 50.0000 mg | ORAL_TABLET | Freq: Two times a day (BID) | ORAL | Status: DC | PRN

## 2013-04-16 MED ORDER — SOLIFENACIN SUCCINATE 10 MG PO TABS: 5.0000 mg | ORAL_TABLET | Freq: Two times a day (BID) | ORAL | Status: DC

## 2013-04-16 MED ORDER — PRAMIPEXOLE DIHYDROCHLORIDE 0.5 MG PO TABS: 0.5000 mg | ORAL_TABLET | Freq: Every day | ORAL | Status: DC

## 2013-04-16 MED ORDER — OMEPRAZOLE 40 MG PO CPDR: 40.0000 mg | DELAYED_RELEASE_CAPSULE | Freq: Every day | ORAL | Status: DC

## 2013-04-16 NOTE — Addendum Note (Signed)
Addended by: Arnette Norris on: 04/16/2013 04:24 PM   Modules accepted: Orders

## 2013-04-16 NOTE — Telephone Encounter (Signed)
Medications request to go to [facility] for Omeprazole, Pramipexole, Tramadol and Vesicare with a 30 day supply of the Vesicare to go to [facility]. Rx faxed    KP

## 2013-04-17 ENCOUNTER — Telehealth: Payer: Self-pay | Admitting: *Deleted

## 2013-04-17 MED ORDER — FENTANYL 12 MCG/HR TD PT72: 1.0000 | MEDICATED_PATCH | TRANSDERMAL | Status: DC

## 2013-04-17 NOTE — Telephone Encounter (Signed)
Refill done.  

## 2013-04-17 NOTE — Telephone Encounter (Signed)
Ok to refill x 1  

## 2013-04-17 NOTE — Telephone Encounter (Signed)
Pt's husband left msg on triage requesting a written prescription for fentanyl 40mcg/hr. OK to refill? Last OV 4.3.14

## 2013-04-20 ENCOUNTER — Encounter: Payer: Self-pay | Admitting: General Practice

## 2013-04-20 NOTE — Telephone Encounter (Signed)
Pt husband notified this rx available for pick up when he called today.

## 2013-05-04 ENCOUNTER — Other Ambulatory Visit: Payer: Self-pay | Admitting: Family Medicine

## 2013-05-04 ENCOUNTER — Other Ambulatory Visit: Payer: Self-pay

## 2013-05-07 ENCOUNTER — Telehealth: Payer: Self-pay | Admitting: Family Medicine

## 2013-05-07 NOTE — Telephone Encounter (Signed)
Pt called to get a refill on fentanyl. thanks

## 2013-05-08 ENCOUNTER — Other Ambulatory Visit: Payer: Self-pay | Admitting: General Practice

## 2013-05-08 MED ORDER — FENTANYL 12 MCG/HR TD PT72: 1.0000 | MEDICATED_PATCH | TRANSDERMAL | Status: DC

## 2013-05-08 NOTE — Telephone Encounter (Signed)
Med filled per Dr. Laury Axon and pt husband notified it would be ready for pick up.

## 2013-05-08 NOTE — Telephone Encounter (Signed)
Ok to refill x 1  

## 2013-05-08 NOTE — Telephone Encounter (Signed)
Last OV 04-02-13, last filled 04-17-13 #5

## 2013-06-02 ENCOUNTER — Telehealth: Payer: Self-pay | Admitting: Family Medicine

## 2013-06-02 MED ORDER — FENTANYL 12 MCG/HR TD PT72: 1.0000 | MEDICATED_PATCH | TRANSDERMAL | Status: DC

## 2013-06-02 NOTE — Telephone Encounter (Signed)
Son made aware Rx ready in the morning.    KP

## 2013-06-02 NOTE — Telephone Encounter (Signed)
Patient is needing a refill of her Fentanyl prescription. She put her last patch on yesterday. Please call when ready.

## 2013-06-02 NOTE — Telephone Encounter (Signed)
Refill x1 

## 2013-06-02 NOTE — Telephone Encounter (Signed)
Last seen 04/02/13 and filled 05/08/13 #5. Please advise    KP

## 2013-06-26 ENCOUNTER — Telehealth: Payer: Self-pay | Admitting: *Deleted

## 2013-06-26 MED ORDER — FENTANYL 12 MCG/HR TD PT72: 1.0000 | MEDICATED_PATCH | TRANSDERMAL | Status: DC

## 2013-06-26 NOTE — Telephone Encounter (Signed)
[name] made aware Rx ready for pick up.     KP

## 2013-06-26 NOTE — Telephone Encounter (Signed)
Ok to refill x 1  

## 2013-06-26 NOTE — Telephone Encounter (Signed)
Last OV 04-02-13, Last refilled 06-02-13 #5

## 2013-07-21 ENCOUNTER — Telehealth: Payer: Self-pay | Admitting: *Deleted

## 2013-07-21 MED ORDER — FENTANYL 12 MCG/HR TD PT72: 1.0000 | MEDICATED_PATCH | TRANSDERMAL | Status: DC

## 2013-07-21 NOTE — Telephone Encounter (Signed)
Ok to refill x 1  

## 2013-07-21 NOTE — Telephone Encounter (Signed)
Patients son called in concern about his mother Kerry Myers) and her fentanyl patches.  Patients son [name]) states that mother is on her last patch needs to know when he can pick up new prescription. Please advise.

## 2013-07-21 NOTE — Telephone Encounter (Signed)
Patient aware Rx ready for pick up tomorrow.    KP 

## 2013-08-10 ENCOUNTER — Telehealth: Payer: Self-pay | Admitting: Family Medicine

## 2013-08-10 NOTE — Telephone Encounter (Signed)
Patient Information:  Caller Name: Kerry Myers  Phone: [phone]  Patient: Kerry Myers, Kerry Myers  Gender: Female  DOB: Apr 05, 1919  Age: 79 Years  PCP: Lelon Perla.  Office Follow Up:  Does the office need to follow up with this patient?: Yes  Instructions For The Office: Please call [name]/Son and Power of Attorney  when written prescription for Fentanyl Patch is ready for pickup- Please call [phone] and  please list this as the primary  contact number.   Symptoms  Reason For Call & Symptoms: Kerry Myers /Son & POA states he needs a new written prescription for Fentanyl patch for Mother/Kerry Myers . Has one patch left.  Please call Kerry Myers when prescription is ready for pick.at [phone]. [name] aware that calllback may be on 08/11/13. Please list this number [phone] as primary contact number on her chart.  Reviewed Health History In EMR: Yes  Reviewed Medications In EMR: Yes  Reviewed Allergies In EMR: Yes  Reviewed Surgeries / Procedures: Yes  Date of Onset of Symptoms: Unknown  Guideline(s) Used:  No Protocol Available - Information Only  Disposition Per Guideline:   Discuss with PCP and Callback by Nurse Today  Reason For Disposition Reached:   Nursing judgment  Advice Given:  N/A  Patient Will Follow Care Advice:  YES

## 2013-08-11 MED ORDER — FENTANYL 12 MCG/HR TD PT72: 1.0000 | MEDICATED_PATCH | TRANSDERMAL | Status: DC

## 2013-08-11 NOTE — Telephone Encounter (Signed)
Last seen 04/02/13 and filled 07/21/13 #5. Please advise     KP

## 2013-08-11 NOTE — Telephone Encounter (Signed)
Ok to refill x 1  

## 2013-08-11 NOTE — Telephone Encounter (Signed)
Patient aware Rx ready for pick up.      KP 

## 2013-08-19 ENCOUNTER — Telehealth: Payer: Self-pay | Admitting: Family Medicine

## 2013-08-19 MED ORDER — SOLIFENACIN SUCCINATE 10 MG PO TABS: 5.0000 mg | ORAL_TABLET | Freq: Two times a day (BID) | ORAL | Status: DC

## 2013-08-19 NOTE — Telephone Encounter (Signed)
Patient needs rx for Vesicare sent to [facility].

## 2013-08-27 ENCOUNTER — Telehealth: Payer: Self-pay | Admitting: General Practice

## 2013-08-27 MED ORDER — TOLTERODINE TARTRATE ER 4 MG PO CP24: 4.0000 mg | ORAL_CAPSULE | Freq: Every day | ORAL | Status: DC

## 2013-08-27 NOTE — Telephone Encounter (Signed)
detrol 4 mg 1 po qd --#30  11 refills---if too expensive we will need a copy of formulary

## 2013-08-27 NOTE — Telephone Encounter (Signed)
[name] has been made aware Rx was faxed to [facility].      KP

## 2013-08-27 NOTE — Telephone Encounter (Signed)
Pt husband called stating that he wants to change the wife's medication from Vesicare to something else due to the cost. The prescription is over $100 for 3 months. Please advise?

## 2013-09-01 ENCOUNTER — Telehealth: Payer: Self-pay | Admitting: General Practice

## 2013-09-01 MED ORDER — FENTANYL 12 MCG/HR TD PT72: 1.0000 | MEDICATED_PATCH | TRANSDERMAL | Status: DC

## 2013-09-01 NOTE — Telephone Encounter (Signed)
Ok to refill #10 patches

## 2013-09-01 NOTE — Telephone Encounter (Signed)
Med filled.  

## 2013-09-01 NOTE — Telephone Encounter (Signed)
Per pharmacy the pt is due for fentanyl due to the Rx only being for 15 days.

## 2013-09-01 NOTE — Telephone Encounter (Signed)
Pt husband called for a Fentanyl refill Last OV 04-02-13 Med last filled 08-11-13 #5 with 0 refills.

## 2013-09-01 NOTE — Telephone Encounter (Signed)
Too early--- soonest pharmacy will fill it is 1 week early

## 2013-09-02 ENCOUNTER — Telehealth: Payer: Self-pay | Admitting: Family Medicine

## 2013-09-02 MED ORDER — FENTANYL 12 MCG/HR TD PT72: 1.0000 | MEDICATED_PATCH | TRANSDERMAL | Status: DC

## 2013-09-02 NOTE — Telephone Encounter (Signed)
Placed up front for pickup ° °

## 2013-09-02 NOTE — Telephone Encounter (Signed)
Patient's husband called stating they need rx for fentanyl patch. It was faxed to the pharmacy, however this medication must be picked up at the office. Patient would like to pick this up today.

## 2013-09-07 NOTE — Telephone Encounter (Signed)
Pt husband called again stating that a PA was suppose to have been done for the detrol LA. Please advise pt if this was completed.

## 2013-09-08 NOTE — Telephone Encounter (Signed)
There has never been any paper work sent to [place] from a pharmacy or an insurance company to start an prior [place].  Ag cma

## 2013-09-10 MED ORDER — OXYBUTYNIN CHLORIDE ER 10 MG PO TB24: 10.0000 mg | ORAL_TABLET | Freq: Every day | ORAL | Status: DC

## 2013-09-10 NOTE — Telephone Encounter (Signed)
oxybutnin er 10 mg qd  #30  5 refills

## 2013-09-10 NOTE — Addendum Note (Signed)
Addended by: Arnette Norris on: 09/10/2013 03:45 PM   Modules accepted: Orders, Medications

## 2013-09-10 NOTE — Telephone Encounter (Signed)
Spoke with the pharmacy they stated that the pt's insurance is declining the Detrol LA. State that they would approve either oxybutnin ER  or trostium. Please advise which medication you would prefer.

## 2013-09-14 NOTE — Telephone Encounter (Signed)
Med filled.  

## 2013-09-23 ENCOUNTER — Other Ambulatory Visit: Payer: Self-pay | Admitting: General Practice

## 2013-09-23 MED ORDER — FENTANYL 12 MCG/HR TD PT72: 1.0000 | MEDICATED_PATCH | TRANSDERMAL | Status: DC

## 2013-09-23 NOTE — Telephone Encounter (Signed)
Pt spouse calling in fentanyl patch for his wife. Knows this is a few days early but is feeling under the weather and will have someone be able to drive him today .

## 2013-11-06 ENCOUNTER — Other Ambulatory Visit: Payer: Self-pay | Admitting: *Deleted

## 2013-11-06 MED ORDER — FENTANYL 12 MCG/HR TD PT72: 12.5000 ug | MEDICATED_PATCH | TRANSDERMAL | Status: DC

## 2013-11-06 NOTE — Telephone Encounter (Signed)
Last visit-04/31/2014  Last filled- 09/23/2013  Please advise, SW, CMA

## 2013-12-03 ENCOUNTER — Encounter: Payer: Self-pay | Admitting: Physician Assistant

## 2013-12-03 ENCOUNTER — Inpatient Hospital Stay (HOSPITAL_BASED_OUTPATIENT_CLINIC_OR_DEPARTMENT_OTHER)
Admission: EM | Admit: 2013-12-03 | Discharge: 2013-12-05 | DRG: 193 | Disposition: A | Discharge status: Home / Self Care | Payer: Medicare Other | Attending: Internal Medicine | Admitting: Internal Medicine

## 2013-12-03 ENCOUNTER — Encounter (HOSPITAL_BASED_OUTPATIENT_CLINIC_OR_DEPARTMENT_OTHER): Payer: Self-pay | Admitting: Emergency Medicine

## 2013-12-03 ENCOUNTER — Emergency Department (HOSPITAL_BASED_OUTPATIENT_CLINIC_OR_DEPARTMENT_OTHER): Payer: Medicare Other

## 2013-12-03 ENCOUNTER — Telehealth: Payer: Self-pay | Admitting: *Deleted

## 2013-12-03 ENCOUNTER — Ambulatory Visit: Payer: Medicare Other | Admitting: Internal Medicine

## 2013-12-03 ENCOUNTER — Other Ambulatory Visit: Payer: Self-pay

## 2013-12-03 ENCOUNTER — Ambulatory Visit (INDEPENDENT_AMBULATORY_CARE_PROVIDER_SITE_OTHER): Payer: Medicare Other | Admitting: Physician Assistant

## 2013-12-03 VITALS — BP 153/73 | HR 77 | Temp 98.2°F | Resp 12 | Ht <= 58 in | Wt 84.8 lb

## 2013-12-03 DIAGNOSIS — R0902 Hypoxemia: Secondary | ICD-10-CM | POA: Insufficient documentation

## 2013-12-03 DIAGNOSIS — I639 Cerebral infarction, unspecified: Secondary | ICD-10-CM

## 2013-12-03 DIAGNOSIS — Z0289 Encounter for other administrative examinations: Secondary | ICD-10-CM

## 2013-12-03 DIAGNOSIS — R0602 Shortness of breath: Secondary | ICD-10-CM

## 2013-12-03 DIAGNOSIS — S98139A Complete traumatic amputation of one unspecified lesser toe, initial encounter: Secondary | ICD-10-CM

## 2013-12-03 DIAGNOSIS — J209 Acute bronchitis, unspecified: Secondary | ICD-10-CM

## 2013-12-03 DIAGNOSIS — J189 Pneumonia, unspecified organism: Principal | ICD-10-CM | POA: Diagnosis present

## 2013-12-03 DIAGNOSIS — J690 Pneumonitis due to inhalation of food and vomit: Secondary | ICD-10-CM

## 2013-12-03 DIAGNOSIS — M129 Arthropathy, unspecified: Secondary | ICD-10-CM | POA: Diagnosis present

## 2013-12-03 DIAGNOSIS — E43 Unspecified severe protein-calorie malnutrition: Secondary | ICD-10-CM | POA: Insufficient documentation

## 2013-12-03 DIAGNOSIS — G2581 Restless legs syndrome: Secondary | ICD-10-CM | POA: Diagnosis present

## 2013-12-03 DIAGNOSIS — G609 Hereditary and idiopathic neuropathy, unspecified: Secondary | ICD-10-CM | POA: Diagnosis present

## 2013-12-03 DIAGNOSIS — R51 Headache: Secondary | ICD-10-CM

## 2013-12-03 DIAGNOSIS — J9620 Acute and chronic respiratory failure, unspecified whether with hypoxia or hypercapnia: Secondary | ICD-10-CM

## 2013-12-03 DIAGNOSIS — G894 Chronic pain syndrome: Secondary | ICD-10-CM | POA: Diagnosis present

## 2013-12-03 DIAGNOSIS — M1990 Unspecified osteoarthritis, unspecified site: Secondary | ICD-10-CM

## 2013-12-03 DIAGNOSIS — J9600 Acute respiratory failure, unspecified whether with hypoxia or hypercapnia: Secondary | ICD-10-CM | POA: Diagnosis present

## 2013-12-03 DIAGNOSIS — K219 Gastro-esophageal reflux disease without esophagitis: Secondary | ICD-10-CM

## 2013-12-03 DIAGNOSIS — I447 Left bundle-branch block, unspecified: Secondary | ICD-10-CM | POA: Diagnosis present

## 2013-12-03 DIAGNOSIS — Z8701 Personal history of pneumonia (recurrent): Secondary | ICD-10-CM

## 2013-12-03 DIAGNOSIS — R531 Weakness: Secondary | ICD-10-CM

## 2013-12-03 DIAGNOSIS — R1310 Dysphagia, unspecified: Secondary | ICD-10-CM

## 2013-12-03 DIAGNOSIS — I2789 Other specified pulmonary heart diseases: Secondary | ICD-10-CM | POA: Diagnosis present

## 2013-12-03 DIAGNOSIS — N390 Urinary tract infection, site not specified: Secondary | ICD-10-CM

## 2013-12-03 LAB — URINALYSIS, ROUTINE W REFLEX MICROSCOPIC
Ketones, ur: NEGATIVE mg/dL
Nitrite: NEGATIVE
Protein, ur: NEGATIVE mg/dL
Urobilinogen, UA: 0.2 mg/dL (ref 0.0–1.0)

## 2013-12-03 LAB — URINE MICROSCOPIC-ADD ON

## 2013-12-03 LAB — CBC WITH DIFFERENTIAL/PLATELET
Basophils Absolute: 0 10*3/uL (ref 0.0–0.1)
Basophils Relative: 0 % (ref 0–1)
Eosinophils Relative: 0 % (ref 0–5)
HCT: 37.2 % (ref 36.0–46.0)
Lymphocytes Relative: 13 % (ref 12–46)
MCHC: 32 g/dL (ref 30.0–36.0)
MCV: 97.6 fL (ref 78.0–100.0)
Monocytes Absolute: 0.9 10*3/uL (ref 0.1–1.0)
Platelets: 188 10*3/uL (ref 150–400)
RDW: 13.5 % (ref 11.5–15.5)
WBC: 9.6 10*3/uL (ref 4.0–10.5)

## 2013-12-03 LAB — BASIC METABOLIC PANEL
CO2: 31 mEq/L (ref 19–32)
Calcium: 9.6 mg/dL (ref 8.4–10.5)
Creatinine, Ser: 0.5 mg/dL (ref 0.50–1.10)
GFR calc Af Amer: >90 mL/min (ref >90)
GFR calc non Af Amer: 81 mL/min — ABNORMAL LOW (ref >90)
Sodium: 138 mEq/L (ref 135–145)

## 2013-12-03 LAB — PRO B NATRIURETIC PEPTIDE: Pro B Natriuretic peptide (BNP): 443.7 pg/mL (ref 0–450)

## 2013-12-03 LAB — STREP PNEUMONIAE URINARY ANTIGEN: Strep Pneumo Urinary Antigen: NEGATIVE

## 2013-12-03 MED ORDER — ALBUTEROL SULFATE HFA 108 (90 BASE) MCG/ACT IN AERS: 2.0000 | INHALATION_SPRAY | Freq: Four times a day (QID) | RESPIRATORY_TRACT | Status: DC | PRN

## 2013-12-03 MED ORDER — PRAMIPEXOLE DIHYDROCHLORIDE 0.25 MG PO TABS
0.5000 mg | ORAL_TABLET | Freq: Every day | ORAL | Status: DC
  Administered 2013-12-03 – 2013-12-04 (×2): 0.5 mg via ORAL
  Filled 2013-12-03 (×3): qty 2

## 2013-12-03 MED ORDER — POLYETHYLENE GLYCOL 3350 17 G PO PACK
17.0000 g | PACK | Freq: Every morning | ORAL | Status: DC
  Administered 2013-12-04: 17 g via ORAL
  Filled 2013-12-03 (×2): qty 1

## 2013-12-03 MED ORDER — DEXTROSE 5 % IV SOLN
500.0000 mg | Freq: Once | INTRAVENOUS | Status: AC
  Administered 2013-12-03: 500 mg via INTRAVENOUS

## 2013-12-03 MED ORDER — OXYBUTYNIN CHLORIDE ER 10 MG PO TB24
10.0000 mg | ORAL_TABLET | Freq: Every day | ORAL | Status: DC
  Administered 2013-12-04 – 2013-12-05 (×2): 10 mg via ORAL
  Filled 2013-12-03 (×2): qty 1

## 2013-12-03 MED ORDER — PANTOPRAZOLE SODIUM 40 MG PO TBEC
40.0000 mg | DELAYED_RELEASE_TABLET | Freq: Every day | ORAL | Status: DC
  Administered 2013-12-04 – 2013-12-05 (×2): 40 mg via ORAL
  Filled 2013-12-03: qty 1

## 2013-12-03 MED ORDER — DEXTROSE 5 % IV SOLN
1.0000 g | INTRAVENOUS | Status: DC
  Administered 2013-12-04: 17:00:00 1 g via INTRAVENOUS
  Filled 2013-12-03 (×2): qty 10

## 2013-12-03 MED ORDER — SODIUM CHLORIDE 0.9 % IV SOLN
INTRAVENOUS | Status: AC
  Administered 2013-12-03: 23:00:00 via INTRAVENOUS

## 2013-12-03 MED ORDER — CEFTRIAXONE SODIUM 1 G IJ SOLR
INTRAMUSCULAR | Status: AC
  Administered 2013-12-03: 20:00:00
  Filled 2013-12-03: qty 10

## 2013-12-03 MED ORDER — TRAMADOL HCL 50 MG PO TABS: 50.0000 mg | ORAL_TABLET | Freq: Two times a day (BID) | ORAL | Status: DC | PRN

## 2013-12-03 MED ORDER — GABAPENTIN 100 MG PO CAPS
200.0000 mg | ORAL_CAPSULE | Freq: Two times a day (BID) | ORAL | Status: DC
  Administered 2013-12-04 – 2013-12-05 (×3): 200 mg via ORAL
  Filled 2013-12-03 (×5): qty 2

## 2013-12-03 MED ORDER — DEXTROSE 5 % IV SOLN
500.0000 mg | INTRAVENOUS | Status: DC
  Administered 2013-12-04: 20:00:00 500 mg via INTRAVENOUS
  Filled 2013-12-03 (×2): qty 500

## 2013-12-03 MED ORDER — BOOST / RESOURCE BREEZE PO LIQD
1.0000 | Freq: Two times a day (BID) | ORAL | Status: DC
  Administered 2013-12-04 – 2013-12-05 (×2): 1 via ORAL

## 2013-12-03 MED ORDER — GABAPENTIN 100 MG PO CAPS
100.0000 mg | ORAL_CAPSULE | Freq: Every day | ORAL | Status: DC
  Administered 2013-12-04 – 2013-12-05 (×2): 100 mg via ORAL
  Filled 2013-12-03 (×2): qty 1

## 2013-12-03 MED ORDER — FENTANYL 12 MCG/HR TD PT72
12.5000 ug | MEDICATED_PATCH | TRANSDERMAL | Status: DC
  Administered 2013-12-04: 12.5 ug via TRANSDERMAL
  Filled 2013-12-03: qty 1

## 2013-12-03 MED ORDER — DEXTROSE 5 % IV SOLN
1.0000 g | Freq: Once | INTRAVENOUS | Status: AC
  Administered 2013-12-03: 1 g via INTRAVENOUS

## 2013-12-03 MED ORDER — HEPARIN SODIUM (PORCINE) 5000 UNIT/ML IJ SOLN
5000.0000 [IU] | Freq: Three times a day (TID) | INTRAMUSCULAR | Status: DC
  Administered 2013-12-03 – 2013-12-05 (×4): 5000 [IU] via SUBCUTANEOUS
  Filled 2013-12-03 (×8): qty 1

## 2013-12-03 NOTE — Progress Notes (Signed)
Pre visit review using our clinic review tool, if applicable. No additional management support is needed unless otherwise documented below in the visit note/SLS  

## 2013-12-03 NOTE — Progress Notes (Signed)
Pt arrived on floor. Alert and oriented. Call light placed within reached.

## 2013-12-03 NOTE — Telephone Encounter (Signed)
Patient's son called and stated that his mother was not feeling to good. Son states that patient oxygen level dropped to 90% and he thinks its because she sleeps with her mouth open and not receiving enough oxygen. Son states that the patient keeps stating that she "doesn't feel to good". Son denies any nausea or vomiting. Patient is very restless at night and that concerns him. Advised son that Dr. Laury Axon was out of the office today and with her oxygen level that low patient would need to be seen at the hospital. Son refuses to take patient to hospital because she would not do well sitting there.Advise patient that Urgent care was always an option. Again son refuses. He would rather be seen here at the office.The only appointment that was available was this afternoon at 4 with Dr.Hopper. Son did take that appointment but was advise that if something change then Urgent care or ER was the next option.

## 2013-12-03 NOTE — ED Notes (Signed)
Vital signs stable. 

## 2013-12-03 NOTE — ED Notes (Signed)
Pt. Reports to EDP she was numb from head to toes on yesterday.  Pt. Reports no numbness at present time.  No reports of shortness of breath or pain.

## 2013-12-03 NOTE — ED Notes (Signed)
Was brought from Dr. Ernst Spell office with low oxygen saturation. Pt denies sob.

## 2013-12-03 NOTE — Progress Notes (Addendum)
Patient ID: Kerry Myers, female   DOB: 1919-11-08, 79 y.o.   MRN: [id number]  Patient presents to clinic today with non-family member caregiver c/o waking up yesterday AM with systemic numbness, weakness and mental confusion that persisted for several hours Since that time patient has not been her self.  Patient complains of weakness of lower extremities and caregiver notes some mental confusion  Caregiver states that she has not noticed any facial drooping but states patient seems weaker.  Patient has history of CAD, pulmonary HTN and hypoxemia. Has history of aspiration pneumonia.  Patient denies shortness of breath or URI symptoms.  Caregiver states patient has been mildly SOB.  O2 sats on RA in room have continually desated from 98% to low 70%.  Patient is alert to person and place but not to time.  Remote memory intact but recent memory impaired.  Patient denies urinary urgency, frequency, dysuria, hematuria, N/V or flank pain.  Past Medical History  Diagnosis Date  . GERD (gastroesophageal reflux disease)   . Bladder incontinence   . Neuropathic pain   . RLS (restless legs syndrome)   . Arthritis   . LBBB (left bundle branch block)     No current facility-administered medications on file prior to visit.   Current Outpatient Prescriptions on File Prior to Visit  Medication Sig Dispense Refill  . albuterol (PROVENTIL HFA;VENTOLIN HFA) 108 (90 BASE) MCG/ACT inhaler Inhale 2 puffs into the lungs every 6 (six) hours as needed for wheezing.  1 Inhaler  0  . Ascorbic Acid (VITAMIN C) 1000 MG tablet Take 1,000 mg by mouth daily.      . calcium carbonate (OS-CAL) 600 MG TABS Take 600 mg by mouth daily.      . Cholecalciferol (VITAMIN D3) 5000 UNITS CAPS Take 1 capsule by mouth daily.      Tery Sanfilippo Calcium (STOOL SOFTENER PO) Take 2 tablets by mouth 2 (two) times daily.      . feeding supplement (RESOURCE BREEZE) LIQD Take 1 Container by mouth 2 (two) times daily between meals.  30 Container     . fentaNYL (DURAGESIC - DOSED MCG/HR) 12 MCG/HR Place 1 patch (12.5 mcg total) onto the skin every 3 (three) days.  10 patch  0  . gabapentin (NEURONTIN) 100 MG capsule Take 100-200 mg by mouth 3 (three) times daily. Takes 200mg  every morning, 100mg  every day at noon, and 200mg  every night.      Marland Kitchen l-methylfolate-B6-B12 (METANX) 3-35-2 MG TABS Take 1 tablet by mouth 2 (two) times daily.      . Multiple Vitamin (MULTIVITAMIN WITH MINERALS) TABS Take 1 tablet by mouth daily.      Marland Kitchen omeprazole (PRILOSEC) 40 MG capsule Take 1 capsule (40 mg total) by mouth at bedtime.  90 capsule  3  . oxybutynin (DITROPAN-XL) 10 MG 24 hr tablet Take 1 tablet (10 mg total) by mouth daily.  30 tablet  5  . Oxygen Permeable Lens Products SOLN 2 L by Does not apply route daily.    0  . polyethylene glycol powder (GLYCOLAX/MIRALAX) powder Take 17 g by mouth every morning.       . pramipexole (MIRAPEX) 0.5 MG tablet Take 1 tablet (0.5 mg total) by mouth at bedtime.  90 tablet  0  . traMADol (ULTRAM) 50 MG tablet Take 1 tablet (50 mg total) by mouth 2 (two) times daily as needed for pain.  180 tablet  0    No Known Allergies  No family  history on file.  History   Social History  . Marital Status: Widowed    Spouse Name: N/A    Number of Children: N/A  . Years of Education: N/A   Social History Main Topics  . Smoking status: Never Smoker   . Smokeless tobacco: Never Used  . Alcohol Use: No  . Drug Use: No  . Sexual Activity: None   Other Topics Concern  . None   Social History Narrative  . None   ROS See HPI.  All other ROS are negative.  Filed Vitals:   12/03/13 1532  BP: 153/73  Pulse: 77  Temp: 98.2 F (36.8 C)  Resp: 12   Physical Exam  Constitutional: She is well-developed, well-nourished, and in no distress.  Cachectic elderly caucasian female in NAD.  HENT:  Head: Normocephalic and atraumatic.  Right Ear: External ear normal.  Left Ear: External ear normal.  Nose: Nose normal.   Mouth/Throat: Oropharynx is clear and moist. No oropharyngeal exudate.  Eyes: Conjunctivae are normal.  Neck: Neck supple.  Cardiovascular: Normal rate, regular rhythm, normal heart sounds and intact distal pulses.   Pulmonary/Chest: She has no wheezes. She has rales. She exhibits no tenderness.  O2 sats decreasing to 70s.  Desat to 80s while on 2L n/c  Abdominal: Soft. Bowel sounds are normal.  Lymphadenopathy:    She has no cervical adenopathy.  Neurological: She has normal sensation and normal strength. No cranial nerve deficit.  Alert, oriented to person and place.  Remote memory intact.  Recent memory impaired.    Skin: Skin is warm.  Psychiatric: Affect and judgment normal. She exhibits abnormal recent memory. She exhibits normal remote memory.   Assessment/Plan: Hypoxia Desats into 70% on RA.  2L N/c applied to patient with desats from 90s into low 80s.  Lung exam abnormal.  BP mildly elevated.  Concern for Pneumonia.  Also stroke-like symptoms endorsed in history.  Also concern for UTI as cause of confusion.  Neuro exam within normal limits.  Patient sent to ER for further evaluation and management.

## 2013-12-03 NOTE — ED Provider Notes (Signed)
TIME SEEN: 4:31 PM  CHIEF COMPLAINT: Hypoxia  HPI: Patient is a 79 year old female with a history of pulmonary hypertension and chronic respiratory failure, neuropathy, overactive bladder who presents the emergency department from her primary care physician's office for hypoxia. Patient was seen by a PA that is not her normal PCP for an episode of total body numbness that started at 10 AM and resolved by 4 PM. She also felt generally weak during this episode. While in the office, the PA seing the patient but the patient seemed confused and could not answer who the president was or the year.  They also reported the patient was hypoxic with oxygen saturation in the 70s. In the ED, patient is in no distress and has no complaints. She denies any headache, numbness, tingling, focal weakness, chest pain or chest discomfort, shortness of breath, vomiting or diarrhea. She has a chronic, dry cough. She has chronic pain in her lower extremities due to neuropathy. Caregiver at bedside, Dennie Bible, reports patient is on oxygen at night and intermittently during the day due to her chronic respiratory failure and pulmonary hypertension. No history of PE or DVT. No history of CHF. Patient was admitted to the hospital in March 2014 for pneumonia.  ROS: See HPI Constitutional: no fever  Eyes: no drainage  ENT: no runny nose   Cardiovascular:  no chest pain  Resp: no SOB  GI: no vomiting GU: no dysuria Integumentary: no rash  Allergy: no hives  Musculoskeletal: no leg swelling  Neurological: no slurred speech ROS otherwise negative  PAST MEDICAL HISTORY/PAST SURGICAL HISTORY:  Past Medical History  Diagnosis Date  . GERD (gastroesophageal reflux disease)   . Bladder incontinence   . Neuropathic pain   . RLS (restless legs syndrome)   . Arthritis   . LBBB (left bundle branch block)     MEDICATIONS:  Prior to Admission medications   Medication Sig Start Date End Date Taking? Authorizing Provider  albuterol  (PROVENTIL HFA;VENTOLIN HFA) 108 (90 BASE) MCG/ACT inhaler Inhale 2 puffs into the lungs every 6 (six) hours as needed for wheezing. 03/27/13   Renae Fickle, MD  Ascorbic Acid (VITAMIN C) 1000 MG tablet Take 1,000 mg by mouth daily.    Historical Provider, MD  calcium carbonate (OS-CAL) 600 MG TABS Take 600 mg by mouth daily.    Historical Provider, MD  Cholecalciferol (VITAMIN D3) 5000 UNITS CAPS Take 1 capsule by mouth daily.    Historical Provider, MD  Docusate Calcium (STOOL SOFTENER PO) Take 2 tablets by mouth 2 (two) times daily.    Historical Provider, MD  feeding supplement (RESOURCE BREEZE) LIQD Take 1 Container by mouth 2 (two) times daily between meals. 03/27/13   Renae Fickle, MD  fentaNYL (DURAGESIC - DOSED MCG/HR) 12 MCG/HR Place 1 patch (12.5 mcg total) onto the skin every 3 (three) days. 11/06/13   Lelon Perla, DO  gabapentin (NEURONTIN) 100 MG capsule Take 100-200 mg by mouth 3 (three) times daily. Takes 200mg  every morning, 100mg  every day at noon, and 200mg  every night.    Historical Provider, MD  l-methylfolate-B6-B12 (METANX) 3-35-2 MG TABS Take 1 tablet by mouth 2 (two) times daily.    Historical Provider, MD  Multiple Vitamin (MULTIVITAMIN WITH MINERALS) TABS Take 1 tablet by mouth daily.    Historical Provider, MD  omeprazole (PRILOSEC) 40 MG capsule Take 1 capsule (40 mg total) by mouth at bedtime. 04/16/13   Lelon Perla, DO  oxybutynin (DITROPAN-XL) 10 MG 24 hr tablet  Take 1 tablet (10 mg total) by mouth daily. 09/10/13   Lelon Perla, DO  Oxygen Permeable Lens Products SOLN 2 L by Does not apply route daily. 05/04/13   Lelon Perla, DO  polyethylene glycol powder (GLYCOLAX/MIRALAX) powder Take 17 g by mouth every morning.     Historical Provider, MD  pramipexole (MIRAPEX) 0.5 MG tablet Take 1 tablet (0.5 mg total) by mouth at bedtime. 04/16/13   Lelon Perla, DO  traMADol (ULTRAM) 50 MG tablet Take 1 tablet (50 mg total) by mouth 2 (two) times daily as needed for  pain. 04/16/13   Lelon Perla, DO    ALLERGIES:  No Known Allergies  SOCIAL HISTORY:  History  Substance Use Topics  . Smoking status: Never Smoker   . Smokeless tobacco: Never Used  . Alcohol Use: No    FAMILY HISTORY: No family history on file.  EXAM: BP 159/85  Pulse 77  Temp(Src) 98.1 F (36.7 C) (Oral)  Resp 24  Wt 85 lb (38.556 kg)  SpO2 100% CONSTITUTIONAL: Alert and oriented to person and place but disoriented to year and responds appropriately to questions. Elderly. Well-appearing; well-nourished HEAD: Normocephalic EYES: Conjunctivae clear, PERRL ENT: normal nose; no rhinorrhea; moist mucous membranes; pharynx without lesions noted NECK: Supple, no meningismus, no LAD  CARD: RRR; S1 and S2 appreciated; no murmurs, no clicks, no rubs, no gallops RESP: Normal chest excursion without splinting or tachypnea; breath sounds clear and equal bilaterally; no wheezes, no rhonchi, mild crackles at bases bilaterally; oxygen saturation is 100% on room air ABD/GI: Normal bowel sounds; non-distended; soft, non-tender, no rebound, no guarding BACK:  The back appears normal and is non-tender to palpation, there is no CVA tenderness EXT: Normal ROM in all joints; non-tender to palpation; no edema; normal capillary refill; no cyanosis    SKIN: Normal color for age and race; warm NEURO: Moves all extremities equally; no pronator drift, sensation to light touch intact diffusely, cranial nerves II through XII intact PSYCH: The patient's mood and manner are appropriate. Grooming and personal hygiene are appropriate.  MEDICAL DECISION MAKING: Patient here for hypoxia in the office today and an episode of total body numbness and generalized weakness that occurred yesterday and lasted approximately 6 hours and now is completely resolved with no neurologic deficits. Patient's room-air sats in the ED are 100% and she is no respiratory distress or complaints of shortness of breath. Will continue  to closely monitor patient's oxygen saturation the ED given her episode of hypoxia earlier today. She is neurologically intact on exam but disoriented to time but it is unclear if this is her baseline. Will check labs, troponin, BNP, urine, chest x-ray.  I am not concerned for intracranial hemorrhage or stroke or TIA given patient had an episode of complete body numbness yesterday that has resolved. Explained to patient that her entire brain would have to have been involved to make her numb all over. Do not feel she needs any brain imaging at this time.  ED PROGRESS: Spoke with pt's son, [name] at [phone], who lives with pt since 2005 and reports pt had daughter there with her yesterday and she was very busy and walked around and "over did it" and afterwards complained of feeling weak.  This was resolved this AM.  Pt wears O2 when sleeping and intermittently in the day and they have a pulse oximeter at home.  Caregiver reports the patient's oxygen level yesterday was 48% at one point in the  afternoon.   Patient's labs are unremarkable. She does appear to have a mild urinary tract infection and a possible left-sided basilar pneumonia. Given her history of acute on chronic respiratory failure in the past and 2 episodes of hypoxia, have recommended admission for IV antibiotics and close monitoring. No further hypoxia in the ED. Patient agrees with this plan. Her primary care physician is Dr. Laury Axon with [facility].     EKG Interpretation    Date/Time:  Thursday December 03 2013 16:27:43 EST Ventricular Rate:  74 PR Interval:  214 QRS Duration: 120 QT Interval:  376 QTC Calculation: 417 R Axis:   -78 Text Interpretation:  Sinus rhythm with 1st degree A-V block Left axis deviation Anterior infarct , age undetermined ST \\T \ T wave abnormality, consider lateral ischemia Abnormal ECG New T wave inversion in V6 compared to 03/07/2013 Confirmed by WARD  DO, KRISTEN ([id number]) on 12/03/2013 4:35:07 PM              Layla Maw Ward, DO 12/04/13 0011

## 2013-12-03 NOTE — H&P (Signed)
[facility] History and Physical  Patient: Kerry Myers  ZOX:[id number]  DOB: 1919-05-06  DOS: the patient was seen and examined on 12/03/2013 PCP: Loreen Freud, DO  Chief Complaint: Cough and other mental status  HPI: Kerry Myers is a 78 y.o. female with Past medical history of GERD, restless leg syndrome, hypertension, history of aspiration pneumonia. The [place]. The patient presented to med [facility] with the complaint of shortness of breath. As per the patient 2 days ago when she woke up she had significant numbness which was all over her body and she could not move her extremities both upper and lower. When the caregiver arrived she checked her oxygen level and was found to be in the 60s. She placed her on oxygen with which her oxygen level improved to 90%. Also her weakness mildly improved. But she continues to have lethargy and confusion throughout the day. She would not be discussing as she would be at her baseline. And therefore she came to the hospital again today. The patient went to see her PCP today since the PCP was not able to hospital she was recommended to go to the emergency room and from there what she was sent here. Next she denies any complaint of chest pain chest pressure,  headache, tingling, numbness, focal neurological deficit, nausea, vomiting, as per the caregiver she was also feverish at home and denies diarrhea, constipation. She has history of aspiration pneumonia and on Monday when she was at lunch and she had choked on a chicken. There is also family members with similar symptoms.  Review of Systems: as mentioned in the history of present illness.  A Comprehensive review of the other systems is negative.  Past Medical History  Diagnosis Date  . GERD (gastroesophageal reflux disease)   . Bladder incontinence   . Neuropathic pain   . RLS (restless legs syndrome)   . Arthritis   . LBBB (left bundle branch block)    Past  Surgical History  Procedure Laterality Date  . Tonsillectomy    . Abdominal hysterectomy      TVH  . Incontinence surgery    . Excision morton's neuroma    . Eye surgery      cataracts  . Amputation  04/30/2012    Procedure: AMPUTATION DIGIT;  Surgeon: Sherri Rad, MD;  Location: [facility];  Service: Orthopedics;  Laterality: Right;  right 2nd toe amptation through MTP joint   Social History:  reports that she has never smoked. She has never used smokeless tobacco. She reports that she does not drink alcohol or use illicit drugs. Independent for most of her  ADL.  No Known Allergies  History reviewed. No pertinent family history.  Prior to Admission medications   Medication Sig Start Date End Date Taking? Authorizing Provider  albuterol (PROVENTIL HFA;VENTOLIN HFA) 108 (90 BASE) MCG/ACT inhaler Inhale 2 puffs into the lungs every 6 (six) hours as needed for wheezing. 03/27/13   Renae Fickle, MD  Ascorbic Acid (VITAMIN C) 1000 MG tablet Take 1,000 mg by mouth daily.    Historical Provider, MD  calcium carbonate (OS-CAL) 600 MG TABS Take 600 mg by mouth daily.    Historical Provider, MD  Cholecalciferol (VITAMIN D3) 5000 UNITS CAPS Take 1 capsule by mouth daily.    Historical Provider, MD  Docusate Calcium (STOOL SOFTENER PO) Take 2 tablets by mouth 2 (two) times daily.    Historical Provider, MD  feeding  supplement (RESOURCE BREEZE) LIQD Take 1 Container by mouth 2 (two) times daily between meals. 03/27/13   Renae Fickle, MD  fentaNYL (DURAGESIC - DOSED MCG/HR) 12 MCG/HR Place 1 patch (12.5 mcg total) onto the skin every 3 (three) days. 11/06/13   Lelon Perla, DO  gabapentin (NEURONTIN) 100 MG capsule Take 100-200 mg by mouth 3 (three) times daily. Takes 200mg  every morning, 100mg  every day at noon, and 200mg  every night.    Historical Provider, MD  l-methylfolate-B6-B12 (METANX) 3-35-2 MG TABS Take 1 tablet by mouth 2 (two) times daily.    Historical Provider, MD   Multiple Vitamin (MULTIVITAMIN WITH MINERALS) TABS Take 1 tablet by mouth daily.    Historical Provider, MD  omeprazole (PRILOSEC) 40 MG capsule Take 1 capsule (40 mg total) by mouth at bedtime. 04/16/13   Lelon Perla, DO  oxybutynin (DITROPAN-XL) 10 MG 24 hr tablet Take 1 tablet (10 mg total) by mouth daily. 09/10/13   Lelon Perla, DO  Oxygen Permeable Lens Products SOLN 2 L by Does not apply route daily. 05/04/13   Lelon Perla, DO  polyethylene glycol powder (GLYCOLAX/MIRALAX) powder Take 17 g by mouth every morning.     Historical Provider, MD  pramipexole (MIRAPEX) 0.5 MG tablet Take 1 tablet (0.5 mg total) by mouth at bedtime. 04/16/13   Lelon Perla, DO  traMADol (ULTRAM) 50 MG tablet Take 1 tablet (50 mg total) by mouth 2 (two) times daily as needed for pain. 04/16/13   Lelon Perla, DO    Physical Exam: Filed Vitals:   12/03/13 1626 12/03/13 1745 12/03/13 1912 12/03/13 2058  BP: 159/85 141/82 131/65 133/69  Pulse: 77 76 70 69  Temp: 98.1 F (36.7 C)   98.2 F (36.8 C)  TempSrc: Oral   Oral  Resp: 24 22 22    Height:    4\' 11"  (1.499 m)  Weight: 38.556 kg (85 lb)   36 kg (79 lb 5.9 oz)  SpO2: 100% 100% 98% 100%    General: Alert, Awake and Oriented to Time, Place and Person. Appear in mild distress Eyes: PERRL ENT: Oral Mucosa clear moist. Neck: no JVD Cardiovascular: S1 and S2 Present, aortic systolic Murmur, Peripheral Pulses Present Respiratory: Bilateral Air entry equal and Decreased, bilateral basal Crackles, no wheezes Abdomen: Bowel Sound Present, Soft and Non tender Skin: no Rash Extremities: no Pedal edema, no calf tenderness Neurologic: Grossly Unremarkable.  Labs on Admission:  CBC:  Recent Labs Lab 12/03/13 1720  WBC 9.6  NEUTROABS 7.4  HGB 11.9*  HCT 37.2  MCV 97.6  PLT 188    CMP     Component Value Date/Time   NA 138 12/03/2013 1720   K 3.8 12/03/2013 1720   CL 98 12/03/2013 1720   CO2 31 12/03/2013 1720   GLUCOSE 98 12/03/2013 1720    GLUCOSE 77 10/24/2006 0911   BUN 18 12/03/2013 1720   CREATININE 0.50 12/03/2013 1720   CALCIUM 9.6 12/03/2013 1720   PROT 6.6 03/23/2013 1150   ALBUMIN 2.9* 03/23/2013 1150   AST 24 03/23/2013 1150   ALT 13 03/23/2013 1150   ALKPHOS 60 03/23/2013 1150   BILITOT 0.3 03/23/2013 1150   GFRNONAA 81* 12/03/2013 1720   GFRAA >90 12/03/2013 1720    No results found for this basename: LIPASE, AMYLASE,  in the last 168 hours No results found for this basename: AMMONIA,  in the last 168 hours   Recent Labs Lab 12/03/13 1720  TROPONINI <  0.30   BNP (last 3 results)  Recent Labs  03/23/13 1150 12/03/13 1720  PROBNP 169.4 443.7    Radiological Exams on Admission: Dg Chest 2 View  12/03/2013   CLINICAL DATA:  Evaluate for edema or infiltrate.  EXAM: CHEST  2 VIEW  COMPARISON:  03/20/2013 and CT 03/23/2013  FINDINGS: Patient is rotated to the right. Lungs are hypoinflated with minimal opacification over the left base as cannot exclude a small amount of pleural fluid/ atelectasis or early infection. There is evidence of patient's known hiatal hernia. Cardiomediastinal silhouette and remainder of the exam is unchanged.  IMPRESSION: Mild left basilar opacification which may be due to small effusion/ atelectasis, although cannot exclude infection.  Hiatal hernia.   Electronically Signed   By: Elberta Fortis M.D.   On: 12/03/2013 17:51    EKG: Independently reviewed. sinus tachycardia.  Assessment/Plan Principal Problem:   CAP (community acquired pneumonia) Active Problems:   RESTLESS LEG SYNDROME   PERIPHERAL NEUROPATHY   PULMONARY HYPERTENSION, MILD   1. CAP (community acquired pneumonia) The patient is presenting with cough and shortness of breath and initially had arteritis to this likely secondary to hypoxia. She had another episode of hypoxia in the med Center and currently on 2 L of nasal cannula. A chest x-ray is suggestive of possible pneumonia which is consistent with the exam. She will  be placed on IV ceftriaxone and azithromycin. Should also be kept n.p.o. and will get a swallowing evaluation. Should be given oxygen as needed. Since her blood pressure is stable she will be gently hydrated only. His anybody would also cover possible UTI 2. Chronic pain Patient is on fentanyl patch since long therefore I would continue  3. Peripheral neuropathy Continue gabapentin  4. Episode of altered mental status Likely secondary to hypoxia, i if it recurs despite good oxygenation then may consider an EEG on reducing the dose of fentanyl.  DVT Prophylaxis: subcutaneous Heparin Nutrition: N.p.o.  Code Status: Full at present she wants to discuss with her son  Disposition: Admitted to inpatient in telemetry unit.  Author: Lynden Oxford, MD [facility] Pager: [phone] 12/03/2013, 10:16 PM    If 7PM-7AM, please contact night-coverage [email or web address] Password [email or web address]

## 2013-12-03 NOTE — Assessment & Plan Note (Addendum)
Desats into 70% on RA.  2L N/c applied to patient with desats from 90s into low 80s.  Lung exam abnormal.  BP mildly elevated.  Concern for Pneumonia.  Also stroke-like symptoms endorsed in history.  Also concern for UTI as cause of confusion.  Neuro exam within normal limits.  Patient sent to ER for further evaluation and management.

## 2013-12-03 NOTE — ED Notes (Signed)
MD at bedside. 

## 2013-12-03 NOTE — ED Notes (Signed)
RN Earlene Plater called to give report and Verlee Monte RN was unabaliable at present time.  Dora to call RN Earlene Plater back at [facility] ED

## 2013-12-03 NOTE — ED Notes (Signed)
Family at bedside. 

## 2013-12-04 ENCOUNTER — Inpatient Hospital Stay (HOSPITAL_COMMUNITY): Payer: Medicare Other

## 2013-12-04 DIAGNOSIS — K219 Gastro-esophageal reflux disease without esophagitis: Secondary | ICD-10-CM | POA: Insufficient documentation

## 2013-12-04 DIAGNOSIS — I6350 Cerebral infarction due to unspecified occlusion or stenosis of unspecified cerebral artery: Secondary | ICD-10-CM

## 2013-12-04 DIAGNOSIS — G2581 Restless legs syndrome: Secondary | ICD-10-CM | POA: Insufficient documentation

## 2013-12-04 DIAGNOSIS — I369 Nonrheumatic tricuspid valve disorder, unspecified: Secondary | ICD-10-CM

## 2013-12-04 DIAGNOSIS — E43 Unspecified severe protein-calorie malnutrition: Secondary | ICD-10-CM | POA: Insufficient documentation

## 2013-12-04 DIAGNOSIS — N390 Urinary tract infection, site not specified: Secondary | ICD-10-CM

## 2013-12-04 DIAGNOSIS — R32 Unspecified urinary incontinence: Secondary | ICD-10-CM | POA: Insufficient documentation

## 2013-12-04 DIAGNOSIS — R0602 Shortness of breath: Secondary | ICD-10-CM

## 2013-12-04 DIAGNOSIS — J189 Pneumonia, unspecified organism: Principal | ICD-10-CM

## 2013-12-04 DIAGNOSIS — J690 Pneumonitis due to inhalation of food and vomit: Secondary | ICD-10-CM

## 2013-12-04 LAB — CBC WITH DIFFERENTIAL/PLATELET
Basophils Absolute: 0 10*3/uL (ref 0.0–0.1)
Eosinophils Absolute: 0.1 10*3/uL (ref 0.0–0.7)
Eosinophils Relative: 1 % (ref 0–5)
HCT: 32 % — ABNORMAL LOW (ref 36.0–46.0)
Hemoglobin: 10.4 g/dL — ABNORMAL LOW (ref 12.0–15.0)
Lymphs Abs: 1.5 10*3/uL (ref 0.7–4.0)
MCH: 31.2 pg (ref 26.0–34.0)
MCV: 96.1 fL (ref 78.0–100.0)
Monocytes Absolute: 0.7 10*3/uL (ref 0.1–1.0)
Monocytes Relative: 11 % (ref 3–12)
Neutro Abs: 4.2 10*3/uL (ref 1.7–7.7)
Platelets: 174 10*3/uL (ref 150–400)
RBC: 3.33 MIL/uL — ABNORMAL LOW (ref 3.87–5.11)
WBC: 6.4 10*3/uL (ref 4.0–10.5)

## 2013-12-04 LAB — URINE CULTURE
Colony Count: NO GROWTH
Culture: NO GROWTH

## 2013-12-04 LAB — COMPREHENSIVE METABOLIC PANEL
AST: 17 U/L (ref 0–37)
Albumin: 2.6 g/dL — ABNORMAL LOW (ref 3.5–5.2)
BUN: 12 mg/dL (ref 6–23)
Chloride: 102 mEq/L (ref 96–112)
Creatinine, Ser: 0.4 mg/dL — ABNORMAL LOW (ref 0.50–1.10)
GFR calc Af Amer: >90 mL/min (ref >90)
Potassium: 3.4 mEq/L — ABNORMAL LOW (ref 3.5–5.1)
Total Bilirubin: 0.3 mg/dL (ref 0.3–1.2)
Total Protein: 5.9 g/dL — ABNORMAL LOW (ref 6.0–8.3)

## 2013-12-04 LAB — LEGIONELLA ANTIGEN, URINE: Legionella Antigen, Urine: NEGATIVE

## 2013-12-04 LAB — HEMOGLOBIN A1C: Mean Plasma Glucose: 114 mg/dL (ref <117)

## 2013-12-04 MED ORDER — ASPIRIN 81 MG PO CHEW
324.0000 mg | CHEWABLE_TABLET | Freq: Every day | ORAL | Status: DC
  Administered 2013-12-04 – 2013-12-05 (×2): 324 mg via ORAL
  Filled 2013-12-04 (×2): qty 4

## 2013-12-04 MED ORDER — ENSURE COMPLETE PO LIQD
237.0000 mL | Freq: Two times a day (BID) | ORAL | Status: DC
  Administered 2013-12-04: 15:00:00 237 mL via ORAL

## 2013-12-04 MED ORDER — WHITE PETROLATUM GEL
Status: DC | PRN
  Administered 2013-12-04: 0.2 via TOPICAL
  Filled 2013-12-04: qty 5

## 2013-12-04 MED ORDER — ATORVASTATIN CALCIUM 40 MG PO TABS
40.0000 mg | ORAL_TABLET | Freq: Every day | ORAL | Status: DC
  Administered 2013-12-04: 17:00:00 40 mg via ORAL
  Filled 2013-12-04 (×2): qty 1

## 2013-12-04 MED ORDER — SODIUM CHLORIDE 0.45 % IV SOLN
INTRAVENOUS | Status: DC
  Administered 2013-12-04: 09:00:00 via INTRAVENOUS
  Filled 2013-12-04 (×3): qty 1000

## 2013-12-04 NOTE — Progress Notes (Signed)
Pt morning Bp 116/59- rechecked manually 100/50. Pt appears well rested. No c/o anything. Contacted Amion. Awaiting for response. Will continue to monitor.

## 2013-12-04 NOTE — Progress Notes (Signed)
INITIAL NUTRITION ASSESSMENT  DOCUMENTATION CODES Per approved criteria  -Severe malnutrition in the context of chronic illness -Underweight  Pt meets criteria for severe MALNUTRITION in the context of chronic illness as evidenced by severe fat and muscle wasting of the temple, clavicle, upper arm region, and orbital regions.  INTERVENTION: - Continue Resource Breeze po BID, each supplement provides 250 kcal and 9 grams of protein. - Ensure Complete po BID, each supplement provides 350 kcal and 13 grams of protein.  NUTRITION DIAGNOSIS: Inadequate oral intake related to chronic illness as evidenced by 2 lb wt loss.   Goal: Pt to meet >/= 90% of their estimated nutrition needs  Monitor:  Wt, po intake, acceptance of supplements, labs  Reason for Assessment: MST  79 y.o. female  Admitting Dx: CAP (community acquired pneumonia)  ASSESSMENT: 79 y.o. female with Past medical history of GERD, restless leg syndrome, hypertension, history of aspiration pneumonia admitted with cough and altered mental status.  Pt reports no loss of appetite or wt loss, however her wt has dropped 2 lbs from her reported usual body weight. Pt denied need for nutritional supplements, but agreed to drink it after encouragement from RD. Pt reports that she has no problem with her appetite. Pt looked obviously malnourished and has a BMI of 16.1, which categorizes her as underweight.   Nutrition Focused Physical Exam:  Subcutaneous Fat:  Orbital Region: severe wasting Upper Arm Region: severe wasting Thoracic and Lumbar Region: severe wasting  Muscle:  Temple Region: severe wasting Clavicle Bone Region: severe wasting Clavicle and Acromion Bone Region: severe wasting Scapular Bone Region: severe wasting Dorsal Hand: severe wasting Patellar Region: severe wasting Anterior Thigh Region: n/a Posterior Calf Region: severe wasting  Edema: none   Height: Ht Readings from Last 1 Encounters:  12/03/13  4\' 11"  (1.499 m)    Weight: Wt Readings from Last 1 Encounters:  12/04/13 81 lb 2.1 oz (36.8 kg)    Ideal Body Weight: 43.2 kg  % Ideal Body Weight: 85%  Wt Readings from Last 10 Encounters:  12/04/13 81 lb 2.1 oz (36.8 kg)  12/03/13 84 lb 12 oz (38.442 kg)  04/02/13 83 lb 3.2 oz (37.739 kg)  03/23/13 81 lb (36.741 kg)  03/23/13 86 lb (39.009 kg)  03/09/13 81 lb 6.4 oz (36.923 kg)  03/03/13 85 lb (38.556 kg)  07/23/12 83 lb 3.2 oz (37.739 kg)  10/05/11 81 lb (36.741 kg)  02/21/11 89 lb (40.37 kg)    Usual Body Weight: 82-83 lbs  % Usual Body Weight: 98%  BMI:  Body mass index is 16.38 kg/(m^2).  Estimated Nutritional Needs: Kcal: 1100-1300 Protein: 45-55 g Fluid: >1.3 L  Skin: Intact  Diet Order: Dysphagia  EDUCATION NEEDS: -No education needs identified at this time   Intake/Output Summary (Last 24 hours) at 12/04/13 1241 Last data filed at 12/04/13 0900  Gross per 24 hour  Intake      0 ml  Output    210 ml  Net   -210 ml    Last BM: none recorded   Labs:   Recent Labs Lab 12/03/13 1720 12/04/13 0519  NA 138 140  K 3.8 3.4*  CL 98 102  CO2 31 32  BUN 18 12  CREATININE 0.50 0.40*  CALCIUM 9.6 8.7  GLUCOSE 98 82    CBG (last 3)  No results found for this basename: GLUCAP,  in the last 72 hours  Scheduled Meds: . aspirin  324 mg Oral Daily  .  atorvastatin  40 mg Oral q1800  . azithromycin  500 mg Intravenous Q24H  . cefTRIAXone (ROCEPHIN)  IV  1 g Intravenous Q24H  . feeding supplement (RESOURCE BREEZE)  1 Container Oral BID BM  . fentaNYL  12.5 mcg Transdermal Q72H  . gabapentin  100 mg Oral Q lunch  . gabapentin  200 mg Oral BID AC & HS  . heparin  5,000 Units Subcutaneous Q8H  . oxybutynin  10 mg Oral Daily  . pantoprazole  40 mg Oral Daily  . polyethylene glycol  17 g Oral q morning - 10a  . pramipexole  0.5 mg Oral QHS    Continuous Infusions: . sodium chloride 0.45 % 1,000 mL with potassium chloride 40 mEq infusion 50  mL/hr at 12/04/13 0848    Past Medical History  Diagnosis Date  . GERD (gastroesophageal reflux disease)   . Bladder incontinence   . Neuropathic pain   . RLS (restless legs syndrome)   . Arthritis   . LBBB (left bundle branch block)     Past Surgical History  Procedure Laterality Date  . Tonsillectomy    . Abdominal hysterectomy      TVH  . Incontinence surgery    . Excision morton's neuroma    . Eye surgery      cataracts  . Amputation  04/30/2012    Procedure: AMPUTATION DIGIT;  Surgeon: Sherri Rad, MD;  Location: [facility];  Service: Orthopedics;  Laterality: Right;  right 2nd toe amptation through MTP joint    Ebbie Latus RD, LDN

## 2013-12-04 NOTE — Progress Notes (Signed)
Echo Lab  2D Echocardiogram completed.  Kerry Myers, RDCS 12/04/2013 10:39 AM

## 2013-12-04 NOTE — Evaluation (Signed)
Occupational Therapy Evaluation Patient Details Name: Kerry Myers MRN: [id number] DOB: Dec 27, 1919 Today's Date: 12/04/2013 Time: 4401-0272 OT Time Calculation (min): 25 min  OT Assessment / Plan / Recommendation History of present illness Kerry Myers is a 79 y.o. female with Past medical history of GERD, restless leg syndrome, hypertension, history of aspiration pneumonia. The patient presented to med [facility] with the complaint of shortness of breath and numbness all over her body   Clinical Impression   Pt demos decline in function and safety with ADLs and ADL mobility ans would benefit from acute OT services to address impairments to help retsore PLOF. Pt has 24/7 care and sup at hoe from family and personal care attendant and would like to return home with continued assist    OT Assessment  Patient needs continued OT Services    Follow Up Recommendations  No OT follow up;Supervision/Assistance - 24 hour    Barriers to Discharge   none, pt has personal ccare attendant an amily assist/sup daily  Equipment Recommendations       Recommendations for Other Services    Frequency  Min 2X/week    Precautions / Restrictions Precautions Precautions: Fall Restrictions Weight Bearing Restrictions: No   Pertinent Vitals/Pain No c/o pain    ADL  Grooming: Performed;Wash/dry hands;Wash/dry face;Minimal assistance Where Assessed - Grooming: Supported standing Upper Body Bathing: Simulated;Moderate assistance;Minimal assistance Where Assessed - Upper Body Bathing: Unsupported sitting Lower Body Bathing: Simulated;Moderate assistance Upper Body Dressing: Performed;Moderate assistance;Minimal assistance Lower Body Dressing: Performed;Moderate assistance Toilet Transfer: Performed;Minimal assistance Toilet Transfer Method: Sit to stand Toilet Transfer Equipment: Regular height toilet;Grab bars;Raised toilet seat with arms (or 3-in-1 over toilet) Toileting - Clothing  Manipulation and Hygiene: Performed;Minimal assistance Where Assessed - Toileting Clothing Manipulation and Hygiene: Standing;Rolling right and/or left Tub/Shower Transfer Method: Not assessed Transfers/Ambulation Related to ADLs: cues for correct hand placement and positioning of RW, pt attempted x 2 to transfer withoutbringing RW with her from standing position ADL Comments: pt has assist at home for safety with bathing and dressing with min A per pt    OT Diagnosis: Generalized weakness  OT Problem List: Decreased strength;Decreased knowledge of use of DME or AE;Decreased knowledge of precautions;Decreased activity tolerance;Impaired balance (sitting and/or standing);Decreased safety awareness OT Treatment Interventions: Self-care/ADL training;Therapeutic exercise;Patient/family education;Neuromuscular education;Balance training;Therapeutic activities;DME and/or AE instruction   OT Goals(Current goals can be found in the care plan section) Acute Rehab OT Goals Patient Stated Goal: to return home OT Goal Formulation: With patient Time For Goal Achievement: 12/11/13 Potential to Achieve Goals: Good ADL Goals Pt Will Perform Grooming: with min guard assist;with supervision;with set-up;standing Pt Will Perform Upper Body Bathing: with min assist;with min guard assist;sitting;standing Pt Will Perform Lower Body Bathing: with min assist;sit to/from stand;sitting/lateral leans Pt Will Perform Upper Body Dressing: with min assist;with min guard assist;sitting;standing Pt Will Perform Lower Body Dressing: with min assist;sitting/lateral leans;sit to/from stand Pt Will Transfer to Toilet: with min guard assist;with supervision;grab bars;regular height toilet;ambulating Pt Will Perform Toileting - Clothing Manipulation and hygiene: with min guard assist;sit to/from stand Additional ADL Goal #1: Pt will participate in R UE strenghtening exercise to increase safety and function with ADLs and ADL  mobility  Visit Information  Last OT Received On: 12/04/13 Assistance Needed: +1 History of Present Illness: Kerry Myers is a 79 y.o. female with Past medical history of GERD, restless leg syndrome, hypertension, history of aspiration pneumonia. The patient presented to med [facility] with the complaint  of shortness of breath and numbness all over her body       Prior Functioning     Home Living Family/patient expects to be discharged to:: Private residence Living Arrangements: Children Available Help at Discharge: Family;Personal care attendant Type of Home: House Home Access: Ramped entrance Home Layout: Two level;Able to live on main level with bedroom/bathroom Alternate Level Stairs-Number of Steps: flight to basement, but pt doesn't use Home Equipment: Cane - single point;Walker - 2 wheels;Bedside commode;Tub bench;Wheelchair - manual Prior Function Level of Independence: Needs assistance ADL's / Homemaking Assistance Needed: personal care attendant 5 -6 days wk to assist with bathing, dressing and home mgt Communication Communication: HOH Dominant Hand: Right         Vision/Perception Vision - History Baseline Vision: Wears glasses all the time Patient Visual Report: No change from baseline Perception Perception: Within Functional Limits   Cognition  Cognition Arousal/Alertness: Awake/alert Behavior During Therapy: WFL for tasks assessed/performed Overall Cognitive Status: No family/caregiver present to determine baseline cognitive functioning    Extremity/Trunk Assessment Upper Extremity Assessment Upper Extremity Assessment: RUE deficits/detail RUE Deficits / Details: 3/5 grossly Lower Extremity Assessment Lower Extremity Assessment: Defer to PT evaluation     Mobility Bed Mobility Bed Mobility: Supine to Sit;Sitting - Scoot to [place] of Bed;Sit to Supine;Scooting to [facility] Supine to Sit: 4: Min assist Sitting - Scoot to Edge of Bed: 4: Min  guard Sit to Supine: 4: Min assist Scooting to [facility]: 3: Mod assist Transfers Transfers: Sit to Stand;Stand to Sit Sit to Stand: 4: Min assist;From bed;From toilet;From chair/3-in-1;With upper extremity assist Stand to Sit: To bed;To toilet;To chair/3-in-1;4: Min assist;With upper extremity assist Details for Transfer Assistance: cues for correct hand placement and positioning of RW, pt attempted x 2 to transfer withoutbringing RW with her from standing position     Exercise     Balance Balance Balance Assessed: Yes Static Sitting Balance Static Sitting - Balance Support: No upper extremity supported;Feet supported Static Sitting - Level of Assistance: 5: Stand by assistance Dynamic Sitting Balance Dynamic Sitting - Balance Support: During functional activity;No upper extremity supported;Feet supported Dynamic Sitting - Level of Assistance: 5: Stand by assistance Dynamic Standing Balance Dynamic Standing - Balance Support: Left upper extremity supported;Bilateral upper extremity supported Dynamic Standing - Level of Assistance: 4: Min assist;Other (comment) (min guard A)   End of Session OT - End of Session Equipment Utilized During Treatment: Gait belt;Rolling walker Activity Tolerance: Patient tolerated treatment well Patient left: in bed;with call bell/phone within reach;with bed alarm set  GO     Galen Manila 12/04/2013, 2:13 PM

## 2013-12-04 NOTE — Progress Notes (Signed)
Bilateral carotid artery duplex:  1-39% ICA stenosis.  Vertebral artery flow is antegrade.     

## 2013-12-04 NOTE — Care Management Note (Unsigned)
    Page 1 of 1   12/04/2013     4:07:00 PM   CARE MANAGEMENT NOTE 12/04/2013  Patient:  Kerry Myers, Kerry Myers   Account Number:  [id number]  Date Initiated:  12/04/2013  Documentation initiated by:  Letha Cape  Subjective/Objective Assessment:   dx cap, ? CVA  admit- lkives with son.     Action/Plan:   pt/ot eval-   Anticipated DC Date:  12/06/2013   Anticipated DC Plan:  HOME W HOME HEALTH SERVICES      DC Planning Services  CM consult      Choice offered to / List presented to:             Status of service:  In process, will continue to follow Medicare Important Message given?   (If response is "NO", the following Medicare IM given date fields will be blank) Date Medicare IM given:   Date Additional Medicare IM given:    Discharge Disposition:    Per UR Regulation:  Reviewed for med. necessity/level of care/duration of stay  If discussed at Long Length of Stay Meetings, dates discussed:    Comments:  12/04/13 16:03 Letha Cape RN, BSN [phone] patient lives with son, patient with ? CVAand cap , MRI pening, NCM will continue to follow for dc needs, await pt/ot eval.

## 2013-12-04 NOTE — Evaluation (Signed)
Clinical/Bedside Swallow Evaluation Patient Details  Name: Kerry Myers MRN: [id number] Date of Birth: [birth year]  Today's Date: 12/04/2013 Time: 4098-1191 SLP Time Calculation (min): 30 min  Past Medical History:  Past Medical History  Diagnosis Date  . GERD (gastroesophageal reflux disease)   . Bladder incontinence   . Neuropathic pain   . RLS (restless legs syndrome)   . Arthritis   . LBBB (left bundle branch block)    Past Surgical History:  Past Surgical History  Procedure Laterality Date  . Tonsillectomy    . Abdominal hysterectomy      TVH  . Incontinence surgery    . Excision morton's neuroma    . Eye surgery      cataracts  . Amputation  04/30/2012    Procedure: AMPUTATION DIGIT;  Surgeon: Sherri Rad, MD;  Location: [facility];  Service: Orthopedics;  Laterality: Right;  right 2nd toe amptation through MTP joint   HPI:  Kerry Myers is a 79 y.o. female admitted with lethargy and confusion, and decreased O2 levels, with PMH of GERD, restless leg syndrome, hypertension, history of aspiration pneumonia.She has history of aspiration pneumonia and on Monday when she was at lunch and she had choked on a chicken.    Assessment / Plan / Recommendation Clinical Impression  Patient presents with a mild oropharyngeal dysphagia characterized by decreased hyolaryngeal elevation (per palpation), and observed increased effort and laryngeal pumping with intake of purees and regular solid textures. Patient stated that she has had difficulty with "phlegm" feeling as though it is stuck in throat, which has been going on for a "long time".     Aspiration Risk  Mild    Diet Recommendation Dysphagia 2 (Fine chop);Thin liquid   Liquid Administration via: Cup;Straw Medication Administration: Whole meds with puree Supervision: Patient able to self feed;Intermittent supervision to cue for compensatory strategies Compensations: Slow rate;Small sips/bites;Follow solids  with liquid Postural Changes and/or Swallow Maneuvers: Seated upright 90 degrees;Upright 30-60 min after meal    Other  Recommendations     Follow Up Recommendations  None;Other (comment) (pending progress, patient likely will not require SLP f/u upon discharge)    Frequency and Duration min 2x/week  2 weeks   Pertinent Vitals/Pain     SLP Swallow Goals   Patient will utilize recommended strategies during swallow to increase swallowing safety with minimal cues/assist. Patient will tolerate trials of upgrade solid textures with no overt s/s aspiration.   Swallow Study Prior Functional Status       General Date of Onset: 12/03/13 HPI: Kerry Myers is a 79 y.o. female admitted with lethargy and confusion, and decreased O2 levels, with PMH of GERD, restless leg syndrome, hypertension, history of aspiration pneumonia.She has history of aspiration pneumonia and on Monday when she was at lunch and she had choked on a chicken.  Type of Study: Bedside swallow evaluation Diet Prior to this Study: Dysphagia 3 (soft);Thin liquids (pt stated that her meats are cut up into small pieces) Temperature Spikes Noted: No History of Recent Intubation: No Behavior/Cognition: Alert;Cooperative;Pleasant mood Oral Cavity - Dentition: Dentures, top Self-Feeding Abilities: Able to feed self;Needs set up Patient Positioning: Upright in bed Baseline Vocal Quality: Clear;Hoarse;Other (comment) (mildly hoarse vocal quality prior to and after PO's) Volitional Cough: Weak Volitional Swallow: Able to elicit    Oral/Motor/Sensory Function Overall Oral Motor/Sensory Function: Appears within functional limits for tasks assessed   Ice Chips Ice chips: Not tested   Thin Liquid Thin  Liquid: Impaired Presentation: Cup;Straw Pharyngeal  Phase Impairments: Decreased hyoid-laryngeal movement Other Comments: No overt s/s aspiration with thin liquids with straw or with cup sips    Nectar Thick Nectar Thick Liquid:  Not tested   Honey Thick Honey Thick Liquid: Not tested   Puree Puree: Impaired Presentation: Self Fed Pharyngeal Phase Impairments: Decreased hyoid-laryngeal movement Other Comments: No overt s/s aspiration noted with puree solids, patient exhibited slight increase in effort with swallowing purees   Solid   GO    Solid: Impaired Presentation: Self Fed Pharyngeal Phase Impairments: Decreased hyoid-laryngeal movement Other Comments: No overt s/s aspiration with solids, patient exhibited laryngeal pumping and increased effort for swallowing regular texture solids.       Elio Forget Tarrell 12/04/2013,4:05 PM  Angela Nevin, MA, CCC-SLP [facility] Speech-Language Pathologist

## 2013-12-04 NOTE — Progress Notes (Signed)
Utilization review completed. Cyan Clippinger, RN, BSN. 

## 2013-12-04 NOTE — Progress Notes (Signed)
[facility] PROGRESS NOTE  Kerry Myers GMW:[id number] DOB: Aug 04, 1919 DOA: 12/03/2013 PCP: Loreen Freud, DO  Assessment/Plan: 1. Possible CVA. Family members reporting that Kerry Myers complaints of generalized numbness last Wednesday. She denied extremity weakness, although noted on today's exam was right-sided facial droop. Stat CT scan of brain did not show acute intracranial abnormality. I have ordered an MRI of the brain as well as physical therapy, occupational therapy and speech pathology consults. Starting aspirin 325 mg by mouth daily as well as Lipitor 40 mg by mouth daily. 2. Suspected [facility] pneumonia. Kerry Myers initially presented with respiratory symptoms suggestive of pneumonia. Chest x-ray performed on 12/03/2013 showed possible mild left opacification which could represent pneumonia. She was started on azithromycin and ceftriaxone. Continue IV antibiotic therapy, supportive care. 3. Hypoxemic respiratory failure, evidence by O2 sats in the 60s per caregiver. Likely secondary to community acquire pneumonia. She is currently stable, with a lesser vitals showing an O2 sat of 97% on 2 L supplemental oxygen. 4. Chronic pain syndrome. Continue gabapentin and fentanyl patch 5. DVT prophylaxis. Subcutaneous heparin  Code Status: Full Family Communication: Plan discussed with caregiver Disposition Plan: Follow up on MRI of brain, carotid dopplers, 2D echo   Procedures:  Pending 2D Echo  Pending Dopplers  Antibiotics:  Azithromycin  Ceftriaxone  HPI/Subjective: Kerry Myers is a pleasant 79 year old female with a past medical history of aspiration pneumonia, hypertension, initially presented to Med [facility] with complaints of cough and shortness of breath. Caregiver had reported checking her oxygen saturations at home, falling into the 60s. A chest x-ray on admission showed mild left opacification which could represent pneumonia. She was started on azithromycin and  ceftriaxone. She was noted to have right-sided facial droop as well as tongue deviation to her left. Kerry Myers having a stat CT scan of brain which did not show acute intracranial abnormality. Currently pending as an MRI of brain, transthoracic echocardiogram as well as carotid Dopplers. Physical therapy, occupational therapy and speech pathology have all been consulted. Given negative CT, start her on aspirin 325 mg by mouth daily.   Objective: Filed Vitals:   12/04/13 0644  BP: 100/50  Pulse: 54  Temp:   Resp:     Intake/Output Summary (Last 24 hours) at 12/04/13 1217 Last data filed at 12/04/13 0900  Gross per 24 hour  Intake      0 ml  Output    210 ml  Net   -210 ml   Filed Weights   12/03/13 1626 12/03/13 2058 12/04/13 0436  Weight: 38.556 kg (85 lb) 36 kg (79 lb 5.9 oz) 36.8 kg (81 lb 2.1 oz)    Exam:   General:  Kerry Myers in no acute distress, reports feeling better from a respiratory standpoint.  Cardiovascular: Regular rate and rhythm, normal S1S2  Respiratory: Lungs are clear on this morning's exam, good air movement, no wheezing or crackles.   Abdomen: Soft, nontender, nondistended  Musculoskeletal: No edema  Neurological: She has some degree of right facial droop, as well as tongue deviation to the left. I did not note slurred speech. 5/5 muscle strength to bilateral upper and lower extremities.No alteration to sensation.  Data Reviewed: Basic Metabolic Panel:  Recent Labs Lab 12/03/13 1720 12/04/13 0519  NA 138 140  K 3.8 3.4*  CL 98 102  CO2 31 32  GLUCOSE 98 82  BUN 18 12  CREATININE 0.50 0.40*  CALCIUM 9.6 8.7   Liver Function Tests:  Recent Labs Lab 12/04/13 0519  AST  17  ALT 9  ALKPHOS 49  BILITOT 0.3  PROT 5.9*  ALBUMIN 2.6*   No results found for this basename: LIPASE, AMYLASE,  in the last 168 hours No results found for this basename: AMMONIA,  in the last 168 hours CBC:  Recent Labs Lab 12/03/13 1720 12/04/13 0519  WBC 9.6  6.4  NEUTROABS 7.4 4.2  HGB 11.9* 10.4*  HCT 37.2 32.0*  MCV 97.6 96.1  PLT 188 174   Cardiac Enzymes:  Recent Labs Lab 12/03/13 1720  TROPONINI <0.30   BNP (last 3 results)  Recent Labs  03/23/13 1150 12/03/13 1720  PROBNP 169.4 443.7   CBG: No results found for this basename: GLUCAP,  in the last 168 hours  No results found for this or any previous visit (from the past 240 hour(s)).   Studies: Dg Chest 2 View  12/03/2013   CLINICAL DATA:  Evaluate for edema or infiltrate.  EXAM: CHEST  2 VIEW  COMPARISON:  03/20/2013 and CT 03/23/2013  FINDINGS: Kerry Myers is rotated to the right. Lungs are hypoinflated with minimal opacification over the left base as cannot exclude a small amount of pleural fluid/ atelectasis or early infection. There is evidence of Kerry Myers's known hiatal hernia. Cardiomediastinal silhouette and remainder of the exam is unchanged.  IMPRESSION: Mild left basilar opacification which may be due to small effusion/ atelectasis, although cannot exclude infection.  Hiatal hernia.   Electronically Signed   By: Elberta Fortis M.D.   On: 12/03/2013 17:51   Ct Head Wo Contrast  12/04/2013   CLINICAL DATA:  78 year old female with confusion. Facial droop. Initial encounter.  EXAM: CT HEAD WITHOUT CONTRAST  TECHNIQUE: Contiguous axial images were obtained from the base of the skull through the vertex without intravenous contrast.  COMPARISON:  [facility] Head CT 02/23/2011  FINDINGS: Mild motion artifact. Paranasal sinuses and mastoids appear clear. No acute orbit or scalp soft tissue findings identified. No acute osseous abnormality identified.  Calcified atherosclerosis at the skull base. Stable cerebral volume. No midline shift, ventriculomegaly, mass effect, evidence of mass lesion, intracranial hemorrhage or evidence of cortically based acute infarction. Gray-white matter differentiation is within normal limits throughout the brain. Stable major intracranial vascular  structures, no suspicious intracranial vascular hyperdensity.  IMPRESSION: No acute intracranial abnormality. Negative for age non contrast CT appearance of the brain.   Electronically Signed   By: Augusto Gamble M.D.   On: 12/04/2013 10:29    Scheduled Meds: . aspirin  324 mg Oral Daily  . azithromycin  500 mg Intravenous Q24H  . cefTRIAXone (ROCEPHIN)  IV  1 g Intravenous Q24H  . feeding supplement (RESOURCE BREEZE)  1 Container Oral BID BM  . fentaNYL  12.5 mcg Transdermal Q72H  . gabapentin  100 mg Oral Q lunch  . gabapentin  200 mg Oral BID AC & HS  . heparin  5,000 Units Subcutaneous Q8H  . oxybutynin  10 mg Oral Daily  . pantoprazole  40 mg Oral Daily  . polyethylene glycol  17 g Oral q morning - 10a  . pramipexole  0.5 mg Oral QHS   Continuous Infusions: . sodium chloride 0.45 % 1,000 mL with potassium chloride 40 mEq infusion 50 mL/hr at 12/04/13 0848    Principal Problem:   CAP (community acquired pneumonia) Active Problems:   RESTLESS LEG SYNDROME   PERIPHERAL NEUROPATHY   PULMONARY HYPERTENSION, MILD    Time spent: 35 minutes    Jeralyn Bennett  [facility] Pager [phone].  If 7PM-7AM, please contact night-coverage at [email or web address], password [facility] 12/04/2013, 12:17 PM  LOS: 1 day

## 2013-12-05 ENCOUNTER — Inpatient Hospital Stay (HOSPITAL_COMMUNITY): Payer: Medicare Other

## 2013-12-05 DIAGNOSIS — J9620 Acute and chronic respiratory failure, unspecified whether with hypoxia or hypercapnia: Secondary | ICD-10-CM

## 2013-12-05 DIAGNOSIS — J209 Acute bronchitis, unspecified: Secondary | ICD-10-CM

## 2013-12-05 DIAGNOSIS — M1990 Unspecified osteoarthritis, unspecified site: Secondary | ICD-10-CM

## 2013-12-05 LAB — CBC
HCT: 31.7 % — ABNORMAL LOW (ref 36.0–46.0)
MCH: 31.6 pg (ref 26.0–34.0)
MCHC: 32.8 g/dL (ref 30.0–36.0)
MCV: 96.4 fL (ref 78.0–100.0)
Platelets: 190 10*3/uL (ref 150–400)
RBC: 3.29 MIL/uL — ABNORMAL LOW (ref 3.87–5.11)

## 2013-12-05 LAB — BASIC METABOLIC PANEL
Calcium: 8.7 mg/dL (ref 8.4–10.5)
Chloride: 102 mEq/L (ref 96–112)
GFR calc Af Amer: >90 mL/min (ref >90)
GFR calc non Af Amer: 88 mL/min — ABNORMAL LOW (ref >90)
Glucose, Bld: 87 mg/dL (ref 70–99)
Potassium: 4 mEq/L (ref 3.5–5.1)
Sodium: 138 mEq/L (ref 135–145)

## 2013-12-05 LAB — LIPID PANEL
HDL: 60 mg/dL (ref >39)
Total CHOL/HDL Ratio: 3.1 RATIO
Triglycerides: 52 mg/dL (ref <150)
VLDL: 10 mg/dL (ref 0–40)

## 2013-12-05 MED ORDER — AZITHROMYCIN 500 MG PO TABS
500.0000 mg | ORAL_TABLET | Freq: Every day | ORAL | Status: DC
  Filled 2013-12-05: qty 1

## 2013-12-05 MED ORDER — CEFUROXIME AXETIL 250 MG PO TABS: 250.0000 mg | ORAL_TABLET | Freq: Two times a day (BID) | ORAL | Status: DC

## 2013-12-05 NOTE — Progress Notes (Signed)
   CARE MANAGEMENT NOTE 12/05/2013  Patient:  Kerry Myers, Kerry Myers   Account Number:  [id number]  Date Initiated:  12/04/2013  Documentation initiated by:  Letha Cape  Subjective/Objective Assessment:   dx cap, ? CVA  admit- lkives with son.     Action/Plan:   pt/ot eval-   Anticipated DC Date:  12/06/2013   Anticipated DC Plan:  HOME W HOME HEALTH SERVICES      DC Planning Services  CM consult      Choice offered to / List presented to:     DME arranged  WALK[facility] [employer]      DME agency  [facility]        Status of service:  Completed, signed off Medicare Important Message given?   (If response is "NO", the following Medicare IM given date fields will be blank) Date Medicare IM given:   Date Additional Medicare IM given:    Discharge Disposition:  HOME/SELF CARE  Per UR Regulation:  Reviewed for med. necessity/level of care/duration of stay  If discussed at Long Length of Stay Meetings, dates discussed:    Comments:  12/05/13 16:35 CM on unit when MD requests I put order in for DME youth walker for pt.  PT/OT followup not recc.  No other CM needs were communicated.  Youth Walker to be delivered to room prior to discharge. Freddy Jaksch, BSN, [place] [phone].  12/04/13 16:03 Letha Cape RN, BSN [phone] patient lives with son, patient with ? CVAand cap , MRI pening, NCM will continue to follow for dc needs, await pt/ot eval.

## 2013-12-05 NOTE — Evaluation (Signed)
Physical Therapy Evaluation Patient Details Name: Kerry Myers MRN: [id number] DOB: [birth year] Today's Date: 12/05/2013 Time: 4098-1191 PT Time Calculation (min): 23 min  PT Assessment / Plan / Recommendation History of Present Illness  Kerry Myers is a 79 y.o. female with Past medical history of GERD, restless leg syndrome, hypertension, history of aspiration pneumonia. The patient presented to med [facility] with the complaint of shortness of breath and numbness all over her body  Clinical Impression  Patient presents at baseline functional level.  Has 24 hour assist at home.  Do not recommend any f/u PT at this time.    PT Assessment  Patent does not need any further PT services    Follow Up Recommendations  No PT follow up;Supervision/Assistance - 24 hour    Does the patient have the potential to tolerate intense rehabilitation      Barriers to Discharge        Equipment Recommendations  Rolling walker with 5" wheels (Needs youth RW - patient < 5' tall)    Recommendations for Other Services     Frequency      Precautions / Restrictions Precautions Precautions: Fall Restrictions Weight Bearing Restrictions: No   Pertinent Vitals/Pain       Mobility  Bed Mobility Bed Mobility: Not assessed Transfers Transfers: Sit to Stand;Stand to Sit Sit to Stand: 4: Min assist;With upper extremity assist;From bed Stand to Sit: 4: Min guard;With upper extremity assist;To bed Details for Transfer Assistance: Patient uses correct hand placement.  Assist to rise to standing and for balance. Ambulation/Gait Ambulation/Gait Assistance: 4: Min guard Ambulation Distance (Feet): 180 Feet Assistive device: Rolling walker Ambulation/Gait Assistance Details: Verbal cues to look up during gait.  Safe use of RW demonstrated. Gait Pattern: Step-through pattern;Decreased stride length;Trunk flexed Gait velocity: Slow gait speed for safety        PT Goals(Current goals can be  found in the care plan section) Acute Rehab PT Goals Patient Stated Goal: to return home  Visit Information  Last PT Received On: 12/05/13 Assistance Needed: +1 History of Present Illness: Kerry Myers is a 79 y.o. female with Past medical history of GERD, restless leg syndrome, hypertension, history of aspiration pneumonia. The patient presented to med [facility] with the complaint of shortness of breath and numbness all over her body       Prior Functioning  Home Living Family/patient expects to be discharged to:: Private residence Living Arrangements: Children Available Help at Discharge: Family;Personal care attendant;Available 24 hours/day Type of Home: House Home Access: Ramped entrance Home Layout: Two level;Able to live on main level with bedroom/bathroom Home Equipment: Gilmer Mor - single point;Walker - 2 wheels;Bedside commode;Tub bench;Wheelchair - manual Environmental manager not functioning well) Additional Comments: lives with son, has aide daily bc son is disabled Prior Function Level of Independence: Independent with assistive device(s);Needs assistance ADL's / Homemaking Assistance Needed: personal care attendant 5 -6 days wk to assist with bathing, dressing and home mgt Communication Communication: HOH Dominant Hand: Right    Cognition  Cognition Arousal/Alertness: Awake/alert Behavior During Therapy: WFL for tasks assessed/performed Overall Cognitive Status: Within Functional Limits for tasks assessed    Extremity/Trunk Assessment Upper Extremity Assessment Upper Extremity Assessment: Defer to OT evaluation Lower Extremity Assessment Lower Extremity Assessment: Generalized weakness Cervical / Trunk Assessment Cervical / Trunk Assessment: Kyphotic   Balance    End of Session PT - End of Session Activity Tolerance: Patient limited by fatigue Patient left: in bed;with call bell/phone within  reach;with family/visitor present (sitting EOB) Nurse Communication: Mobility  status  GP     Vena Austria 12/05/2013, 6:08 PM Durenda Hurt. Renaldo Fiddler, [facility] Acute Rehab Services Pager [phone]

## 2013-12-05 NOTE — Progress Notes (Signed)
Occupational Therapy Treatment Patient Details Name: Kerry Myers MRN: [id number] DOB: 1919-02-09 Today's Date: 12/05/2013 Time: 4098-1191 OT Time Calculation (min): 46 min  OT Assessment / Plan / Recommendation  History of present illness Kerry Myers is a 79 y.o. female with Past medical history of GERD, restless leg syndrome, hypertension, history of aspiration pneumonia. The patient presented to med [facility] with the complaint of shortness of breath and numbness all over her body   OT comments  Educated on safety with functional transfers on and off 3in1. Pt practiced several times for toileting and then to 3in1 for shower that daughter assisted with. Discussed safety with always having walker with her when she stands/pulls up clothing, etc. Daughter states someone is always there with pt.   Follow Up Recommendations  No OT follow up;Supervision/Assistance - 24 hour    Barriers to Discharge       Equipment Recommendations  None recommended by OT    Recommendations for Other Services    Frequency Min 2X/week   Progress towards OT Goals Progress towards OT goals: Progressing toward goals  Plan Discharge plan remains appropriate    Precautions / Restrictions Precautions Precautions: Fall Restrictions Weight Bearing Restrictions: No   Pertinent Vitals/Pain No complaint of pain    ADL  Toilet Transfer: Performed;Minimal assistance Toilet Transfer Equipment: Raised toilet seat with arms (or 3-in-1 over toilet) Toileting - Clothing Manipulation and Hygiene: Performed;Minimal assistance Where Assessed - Toileting Clothing Manipulation and Hygiene: Sit to stand from 3-in-1 or toilet Equipment Used: Rolling walker ADL Comments: Pts daughter present and getting ready to help pt with a shower for d/c. She states at home she uses the tub and has a [place] but usually prefers to step over tub. Daughter states she always has assist. Assisted pt to 3in1 and she tends to push  walker too far in front of her at times, needing min cues to keep walker closer to her. She also needs cues for hand placement on 3in1 armrests. Assisted pt over to 3in1 in shower with min assist to sit on 3in1 for shower also. Pt needed min cues for managing walker safely in tighter space and when done with shower to make sure walker is in front of her prior to standing up. Pt states her walker at home doesnt work very well and the wheel dont turn easily. She isnt sure if she got her walker through Medicare or not. Informed PT that she may need new RW.     OT Diagnosis:    OT Problem List:   OT Treatment Interventions:     OT Goals(current goals can now be found in the care plan section)    Visit Information  Last OT Received On: 12/05/13 Assistance Needed: +1 History of Present Illness: Kerry Myers is a 79 y.o. female with Past medical history of GERD, restless leg syndrome, hypertension, history of aspiration pneumonia. The patient presented to med [facility] with the complaint of shortness of breath and numbness all over her body    Subjective Data      Prior Functioning       Cognition  Cognition Arousal/Alertness: Awake/alert Behavior During Therapy: WFL for tasks assessed/performed Overall Cognitive Status: Within Functional Limits for tasks assessed    Mobility  Transfers Transfers: Sit to Stand;Stand to Sit Sit to Stand: 4: Min assist;With upper extremity assist;From bed;From chair/3-in-1 Stand to Sit: 4: Min assist;With upper extremity assist;To chair/3-in-1 Details for Transfer Assistance: verbal cues for  hand placement, min assist to rise and steady and control descent.    Exercises      Balance     End of Session OT - End of Session Equipment Utilized During Treatment: Rolling walker Activity Tolerance: Patient tolerated treatment well Patient left: with call bell/phone within reach;with family/visitor present (EOb with daughter)  GO     Lennox Laity [phone] 12/05/2013, 4:21 PM

## 2013-12-05 NOTE — Discharge Summary (Signed)
Physician Discharge Summary  DREYAH MONTROSE ZOX:[id number] DOB: [birth year] DOA: 12/03/2013  PCP: Loreen Freud, DO  Admit date: 12/03/2013 Discharge date: 12/05/2013  Time spent: 35 minutes  Recommendations for Outpatient Follow-up:  1. Please follow up on breathing status  Discharge Diagnoses:  Principal Problem:   CAP (community acquired pneumonia) Active Problems:   RESTLESS LEG SYNDROME   PERIPHERAL NEUROPATHY   PULMONARY HYPERTENSION, MILD   Protein-calorie malnutrition, severe   Discharge Condition: Stable/improved  Diet recommendation: Heart Healthy  Filed Weights   12/03/13 2058 12/04/13 0436 12/05/13 0549  Weight: 36 kg (79 lb 5.9 oz) 36.8 kg (81 lb 2.1 oz) 37 kg (81 lb 9.1 oz)    History of present illness:  Kerry Myers is a 79 y.o. female with Past medical history of GERD, restless leg syndrome, hypertension, history of aspiration pneumonia.  The patient [place].  The patient presented to [facility] with the complaint of shortness of breath.  As per the patient 2 days ago when she woke up she had significant numbness which was all over her body and she could not move her extremities both upper and lower. When the caregiver arrived she checked her oxygen level and was found to be in the 60s. She placed her on oxygen with which her oxygen level improved to 90%. Also her weakness mildly improved. But she continues to have lethargy and confusion throughout the day. She would not be discussing as she would be at her baseline. And therefore she came to the hospital again today. The patient went to see her PCP today since the PCP was not able to hospital she was recommended to go to the emergency room and from there what she was sent here. Next she denies any complaint of chest pain chest pressure, headache, tingling, numbness, focal neurological deficit, nausea, vomiting, as per the caregiver she was also feverish at home and denies diarrhea, constipation.  She has history of aspiration pneumonia and on Monday when she was at lunch and she had choked on a chicken. There is also family members with similar symptoms.  Hospital Course:  Patient is a pleasant 79 year old female with a past medical history of hypertension, gastroesophageal reflux disease, who was admitted to the service on 12/03/2013. She presented with complaints of cough, shortness of breath as well as mental status changes. Initial chest x-ray performed in the emergency department showed mild left basilar opacification which could be secondary to infection. She was started on empiric IV antibiotic therapy with ceftriaxone and azithromycin. On my initial evaluation she appear to have right-sided facial droop and she reported having an episode of numbness several days prior to this presentation. I was concerned about the possibility of CVA for which a stat CT scan of brain was obtained, fortunately did not show acute intracranial abnormality. This was followed with an MRI of the brain performed on 12/05/2013 which was negative for acute infarct or intracranial hemorrhage. From a respiratory standpoint, patient clinically improved and by 12/05/2013, was satting 92% on room air, tolerating by mouth intake, felt ready to go home on this day. She was discharged in stable condition.  Procedures:  Transthoracic echocardiogram performed 12/04/2013 impression: EF 50-55%, no wall wall motion abnormalities, grade 1 diastolic dysfunction.   Discharge Exam: Filed Vitals:   12/05/13 0549  BP: 113/67  Pulse: 60  Temp: 98.6 F (37 C)  Resp: 18    General: Patient is in no acute distress, sitting up at  bedside, states feeling better. Cardiovascular: Regular rate rhythm normal S1-S2 no murmurs rubs or gallop Respiratory: Lungs clear to auscultation bilaterally, significant improvement on lung exam Abdomen: Soft nontender nontender positive bowel sounds  Discharge Instructions  Discharge Orders    Future Orders Complete By Expires   Call MD for:  difficulty breathing, headache or visual disturbances  As directed    Call MD for:  extreme fatigue  As directed    Call MD for:  persistant dizziness or light-headedness  As directed    Call MD for:  persistant nausea and vomiting  As directed    Call MD for:  severe uncontrolled pain  As directed    Call MD for:  temperature >100.4  As directed    Diet - low sodium heart healthy  As directed    Increase activity slowly  As directed        Medication List         albuterol 108 (90 BASE) MCG/ACT inhaler  Commonly known as:  PROVENTIL HFA;VENTOLIN HFA  Inhale 2 puffs into the lungs every 6 (six) hours as needed for wheezing.     calcium carbonate 600 MG Tabs tablet  Commonly known as:  OS-CAL  Take 600 mg by mouth daily.     cefUROXime 250 MG tablet  Commonly known as:  CEFTIN  Take 1 tablet (250 mg total) by mouth 2 (two) times daily with a meal.     feeding supplement (RESOURCE BREEZE) Liqd  Take 1 Container by mouth 2 (two) times daily between meals.     fentaNYL 12 MCG/HR  Commonly known as:  DURAGESIC - dosed mcg/hr  Place 1 patch (12.5 mcg total) onto the skin every 3 (three) days.     gabapentin 100 MG capsule  Commonly known as:  NEURONTIN  Take 100-200 mg by mouth 3 (three) times daily. Takes 200mg  every morning, 100mg  every day at noon, and 200mg  every night.     l-methylfolate-B6-B12 3-35-2 MG Tabs  Commonly known as:  METANX  Take 1 tablet by mouth 2 (two) times daily.     multivitamin with minerals Tabs tablet  Take 1 tablet by mouth daily.     omeprazole 40 MG capsule  Commonly known as:  PRILOSEC  Take 1 capsule (40 mg total) by mouth at bedtime.     oxybutynin 10 MG 24 hr tablet  Commonly known as:  DITROPAN-XL  Take 1 tablet (10 mg total) by mouth daily.     Oxygen Permeable Lens Products Soln  2 L by Does not apply route daily.     polyethylene glycol powder powder  Commonly known as:   GLYCOLAX/MIRALAX  Take 17 g by mouth every morning.     pramipexole 0.5 MG tablet  Commonly known as:  MIRAPEX  Take 1 tablet (0.5 mg total) by mouth at bedtime.     STOOL SOFTENER PO  Take 2 tablets by mouth 2 (two) times daily.     traMADol 50 MG tablet  Commonly known as:  ULTRAM  Take 1 tablet (50 mg total) by mouth 2 (two) times daily as needed for pain.     vitamin C 1000 MG tablet  Take 1,000 mg by mouth daily.     Vitamin D3 5000 UNITS Caps  Take 1 capsule by mouth daily.       No Known Allergies     Follow-up Information   Follow up with Loreen Freud, DO In 1 week.  Specialty:  Family Medicine   Contact information:   [phone] [address] [facility] [address] [place] [address] [phone]        The results of significant diagnostics from this hospitalization (including imaging, microbiology, ancillary and laboratory) are listed below for reference.    Significant Diagnostic Studies: Dg Chest 2 View  12/03/2013   CLINICAL DATA:  Evaluate for edema or infiltrate.  EXAM: CHEST  2 VIEW  COMPARISON:  03/20/2013 and CT 03/23/2013  FINDINGS: Patient is rotated to the right. Lungs are hypoinflated with minimal opacification over the left base as cannot exclude a small amount of pleural fluid/ atelectasis or early infection. There is evidence of patient's known hiatal hernia. Cardiomediastinal silhouette and remainder of the exam is unchanged.  IMPRESSION: Mild left basilar opacification which may be due to small effusion/ atelectasis, although cannot exclude infection.  Hiatal hernia.   Electronically Signed   By: Elberta Fortis M.D.   On: 12/03/2013 17:51   Ct Head Wo Contrast  12/04/2013   CLINICAL DATA:  79 year old female with confusion. Facial droop. Initial encounter.  EXAM: CT HEAD WITHOUT CONTRAST  TECHNIQUE: Contiguous axial images were obtained from the base of the skull through the vertex without intravenous contrast.  COMPARISON:  [facility] Head CT 02/23/2011  FINDINGS: Mild motion artifact. Paranasal sinuses and mastoids appear clear. No acute orbit or scalp soft tissue findings identified. No acute osseous abnormality identified.  Calcified atherosclerosis at the skull base. Stable cerebral volume. No midline shift, ventriculomegaly, mass effect, evidence of mass lesion, intracranial hemorrhage or evidence of cortically based acute infarction. Gray-white matter differentiation is within normal limits throughout the brain. Stable major intracranial vascular structures, no suspicious intracranial vascular hyperdensity.  IMPRESSION: No acute intracranial abnormality. Negative for age non contrast CT appearance of the brain.   Electronically Signed   By: Augusto Gamble M.D.   On: 12/04/2013 10:29   Mr Brain Wo Contrast  12/05/2013   CLINICAL DATA:  Generalized numbness since last week. Right facial droop.  EXAM: MRI HEAD WITHOUT CONTRAST  TECHNIQUE: Multiplanar, multiecho pulse sequences of the brain and surrounding structures were obtained without intravenous contrast.  COMPARISON:  12/04/2013 head CT.  04/27/2010 brain MR.  FINDINGS: No acute infarct.  No intracranial hemorrhage.  Global atrophy. Ventricular prominence may be related to central atrophy although difficult to completely exclude a mild component of hydrocephalus. Appearance is unchanged.  Mild small vessel disease type changes.  No intracranial mass lesion noted on this unenhanced exam.  Major intracranial vascular structures are patent.  Cervical spondylotic changes incompletely assessed on present exam.  Cervical medullary junction, pituitary region, pineal region and orbital structures unremarkable.  Partial opacification inferior right mastoid air cell.  IMPRESSION: No acute infarct.  No intracranial hemorrhage.  Global atrophy. Ventricular prominence may be related to central atrophy although difficult to completely exclude a mild component of hydrocephalus. Appearance is  unchanged.  Mild small vessel disease type changes.  Cervical spondylotic changes incompletely assessed on present exam.  Partial opacification inferior right mastoid air cell.   Electronically Signed   By: Bridgett Larsson M.D.   On: 12/05/2013 13:19    Microbiology: Recent Results (from the past 240 hour(s))  URINE CULTURE     Status: None   Collection Time    12/03/13  5:06 PM      Result Value Range Status   Specimen Description URINE, CLEAN CATCH   Final   Special Requests NONE  Final   Culture  Setup Time     Final   Value: 12/04/2013 00:35     Performed at [facility] Count     Final   Value: NO GROWTH     Performed at [facility]   Culture     Final   Value: NO GROWTH     Performed at [facility]   Report Status 12/04/2013 FINAL   Final  CULTURE, BLOOD (ROUTINE X 2)     Status: None   Collection Time    12/03/13  6:40 PM      Result Value Range Status   Specimen Description BLOOD LEFT AC   Final   Special Requests BOTTLES DRAWN AEROBIC AND ANAEROBIC 5CC EACH   Final   Culture  Setup Time     Final   Value: 12/04/2013 00:27     Performed at [facility]   Culture     Final   Value:        BLOOD CULTURE RECEIVED NO GROWTH TO DATE CULTURE WILL BE HELD FOR 5 DAYS BEFORE ISSUING A FINAL NEGATIVE REPORT     Performed at [facility]   Report Status PENDING   Incomplete  CULTURE, BLOOD (ROUTINE X 2)     Status: None   Collection Time    12/03/13  6:45 PM      Result Value Range Status   Specimen Description BLOOD RIGHT FA   Final   Special Requests BOTTLES DRAWN AEROBIC AND ANAEROBIC 4 CC EACH   Final   Culture  Setup Time     Final   Value: 12/04/2013 00:27     Performed at [facility]   Culture     Final   Value:        BLOOD CULTURE RECEIVED NO GROWTH TO DATE CULTURE WILL BE HELD FOR 5 DAYS BEFORE ISSUING A FINAL NEGATIVE REPORT     Performed at [facility]   Report Status PENDING   Incomplete      Labs: Basic Metabolic Panel:  Recent Labs Lab 12/03/13 1720 12/04/13 0519 12/05/13 0528  NA 138 140 138  K 3.8 3.4* 4.0  CL 98 102 102  CO2 31 32 29  GLUCOSE 98 82 87  BUN 18 12 12   CREATININE 0.50 0.40* 0.39*  CALCIUM 9.6 8.7 8.7   Liver Function Tests:  Recent Labs Lab 12/04/13 0519  AST 17  ALT 9  ALKPHOS 49  BILITOT 0.3  PROT 5.9*  ALBUMIN 2.6*   No results found for this basename: LIPASE, AMYLASE,  in the last 168 hours No results found for this basename: AMMONIA,  in the last 168 hours CBC:  Recent Labs Lab 12/03/13 1720 12/04/13 0519 12/05/13 0528  WBC 9.6 6.4 5.6  NEUTROABS 7.4 4.2  --   HGB 11.9* 10.4* 10.4*  HCT 37.2 32.0* 31.7*  MCV 97.6 96.1 96.4  PLT 188 174 190   Cardiac Enzymes:  Recent Labs Lab 12/03/13 1720  TROPONINI <0.30   BNP: BNP (last 3 results)  Recent Labs  03/23/13 1150 12/03/13 1720  PROBNP 169.4 443.7   CBG: No results found for this basename: GLUCAP,  in the last 168 hours     Signed:  Jeralyn Bennett  [facility] 12/05/2013, 2:43 PM

## 2013-12-05 NOTE — Progress Notes (Signed)
PHARMACIST - PHYSICIAN COMMUNICATION DR:  Vanessa Barbara CONCERNING: Antibiotic IV to Oral Route Change Policy  RECOMMENDATION: This patient is receiving Azithromycin by the intravenous route.  Based on criteria approved by the Pharmacy and Therapeutics Committee, the antibiotic(s) is/are being converted to the equivalent oral dose form(s).   DESCRIPTION: These criteria include:  Patient being treated for a respiratory tract infection, urinary tract infection, cellulitis or clostridium difficile associated diarrhea if on metronidazole  The patient is not neutropenic and does not exhibit a GI malabsorption state  The patient is eating (either orally or via tube) and/or has been taking other orally administered medications for a least 24 hours  The patient is improving clinically and has a Tmax < 100.5  If you have questions about this conversion, please contact the Pharmacy Department  []   [phone] )  [facility] [x]   [phone] )  [facility]  []   [phone] )  [facility] []   [phone] )  [facility]   Georgina Pillion, PharmD, BCPS Clinical Pharmacist Pager: [phone] 12/05/2013 1:17 PM

## 2013-12-07 ENCOUNTER — Telehealth: Payer: Self-pay | Admitting: *Deleted

## 2013-12-07 ENCOUNTER — Other Ambulatory Visit: Payer: Self-pay | Admitting: *Deleted

## 2013-12-07 NOTE — Telephone Encounter (Signed)
Spoke with son [name] and he said that his sister [name] could better give me the info. She said she is unable to get a BP reading on her mother, and her 56 stats were all over the place, she is currently ok but the daughter was concerned about her. Apt scheduled for tomorrow for Evaluation.      KP

## 2013-12-07 NOTE — Telephone Encounter (Signed)
If any trouble breathing --- go to ER Otherwise schedule hospital f/u here

## 2013-12-07 NOTE — Telephone Encounter (Signed)
Message from Triage Line on side A  Ladonna called and stated that patients BP was 122/49 and O2 was 82% today. She stated that they put oxygen on patient her O2 increased. She stated that patient was released from hospital on Saturday and is increasing fluid intake. [name] would like to know if anything else needs to be done at this time. Please advise.   **Ladonna did not state where she was calling from, nor did she leave a call back number.

## 2013-12-08 ENCOUNTER — Encounter: Payer: Self-pay | Admitting: Family Medicine

## 2013-12-08 ENCOUNTER — Ambulatory Visit (INDEPENDENT_AMBULATORY_CARE_PROVIDER_SITE_OTHER): Payer: Medicare Other | Admitting: Family Medicine

## 2013-12-08 VITALS — BP 112/68 | HR 16 | Temp 98.2°F | Wt 83.2 lb

## 2013-12-08 DIAGNOSIS — J189 Pneumonia, unspecified organism: Secondary | ICD-10-CM

## 2013-12-08 DIAGNOSIS — D239 Other benign neoplasm of skin, unspecified: Secondary | ICD-10-CM

## 2013-12-08 DIAGNOSIS — D229 Melanocytic nevi, unspecified: Secondary | ICD-10-CM

## 2013-12-08 NOTE — Assessment & Plan Note (Addendum)
Finish abx bp and pulse ox running low Pt is no distress.  We were unable to get accurate reading of pulse ox--- home health to get some readings at home with walking.  Check cxr next week rto 3 months or sooner ---depends on cxr reading

## 2013-12-08 NOTE — Progress Notes (Signed)
   Subjective:    Patient ID: Kerry Myers, female    DOB: [birth year], 79 y.o.   MRN: [id number]  HPI Pt here wiith daughter and home aid.  She was admitted to hospital with pneumonia and d/c few days ago.   Her bp has been running low and they have been unable to get her pulse ox at home.  He has not been any distress and has no new complaintls.     Review of Systems As above    Objective:   Physical Exam  BP 112/68  Pulse 16  Temp(Src) 98.2 F (36.8 C) (Oral)  Wt 83 lb 3.2 oz (37.739 kg) General appearance: alert, cooperative, appears stated age and no distress Neck: no adenopathy, no carotid bruit, no JVD, supple, symmetrical, trachea midline and thyroid not enlarged, symmetric, no tenderness/mass/nodules Lungs: clear to auscultation bilaterally Heart: S1, S2 normal Extremities: extremities normal, atraumatic, no cyanosis or edema      Assessment & Plan:

## 2013-12-08 NOTE — Patient Instructions (Addendum)
Finish your antibiotics Get chest xray next week        Pneumonia, Adult Pneumonia is an infection of the lungs.  CAUSES Pneumonia may be caused by bacteria or a virus. Usually, these infections are caused by breathing infectious particles into the lungs (respiratory tract). SYMPTOMS   Cough.  Fever.  Chest pain.  Increased rate of breathing.  Wheezing.  Mucus production. DIAGNOSIS  If you have the common symptoms of pneumonia, your caregiver will typically confirm the diagnosis with a chest X-ray. The X-ray will show an abnormality in the lung (pulmonary infiltrate) if you have pneumonia. Other tests of your blood, urine, or sputum may be done to find the specific cause of your pneumonia. Your caregiver may also do tests (blood gases or pulse oximetry) to see how well your lungs are working. TREATMENT  Some forms of pneumonia may be spread to other people when you cough or sneeze. You may be asked to wear a mask before and during your exam. Pneumonia that is caused by bacteria is treated with antibiotic medicine. Pneumonia that is caused by the influenza virus may be treated with an antiviral medicine. Most other viral infections must run their course. These infections will not respond to antibiotics.  PREVENTION A pneumococcal shot (vaccine) is available to prevent a common bacterial cause of pneumonia. This is usually suggested for:  People over 31 years old.  Patients on chemotherapy.  People with chronic lung problems, such as bronchitis or emphysema.  People with immune system problems. If you are over 65 or have a high risk condition, you may receive the pneumococcal vaccine if you have not received it before. In some countries, a routine influenza vaccine is also recommended. This vaccine can help prevent some cases of pneumonia.You may be offered the influenza vaccine as part of your care. If you smoke, it is time to quit. You may receive instructions on how to stop  smoking. Your caregiver can provide medicines and counseling to help you quit. HOME CARE INSTRUCTIONS   Cough suppressants may be used if you are losing too much rest. However, coughing protects you by clearing your lungs. You should avoid using cough suppressants if you can.  Your caregiver may have prescribed medicine if he or she thinks your pneumonia is caused by a bacteria or influenza. Finish your medicine even if you start to feel better.  Your caregiver may also prescribe an expectorant. This loosens the mucus to be coughed up.  Only take over-the-counter or prescription medicines for pain, discomfort, or fever as directed by your caregiver.  Do not smoke. Smoking is a common cause of bronchitis and can contribute to pneumonia. If you are a smoker and continue to smoke, your cough may last several weeks after your pneumonia has cleared.  A cold steam vaporizer or humidifier in your room or home may help loosen mucus.  Coughing is often worse at night. Sleeping in a semi-upright position in a recliner or using a couple pillows under your head will help with this.  Get rest as you feel it is needed. Your body will usually let you know when you need to rest. SEEK IMMEDIATE MEDICAL CARE IF:   Your illness becomes worse. This is especially true if you are elderly or weakened from any other disease.  You cannot control your cough with suppressants and are losing sleep.  You begin coughing up blood.  You develop pain which is getting worse or is uncontrolled with medicines.  You  have a fever.  Any of the symptoms which initially brought you in for treatment are getting worse rather than better.  You develop shortness of breath or chest pain. MAKE SURE YOU:   Understand these instructions.  Will watch your condition.  Will get help right away if you are not doing well or get worse. Document Released: 12/17/2005 Document Revised: 03/10/2012 Document Reviewed:  03/08/2011 [facility] Patient Information 2014 [place], [place].

## 2013-12-08 NOTE — Progress Notes (Signed)
Pre visit review using our clinic review tool, if applicable. No additional management support is needed unless otherwise documented below in the visit note. 

## 2013-12-10 ENCOUNTER — Telehealth: Payer: Self-pay | Admitting: Family Medicine

## 2013-12-10 LAB — CULTURE, BLOOD (ROUTINE X 2)
Culture: NO GROWTH
Culture: NO GROWTH

## 2013-12-10 MED ORDER — OMEPRAZOLE 40 MG PO CPDR: 40.0000 mg | DELAYED_RELEASE_CAPSULE | Freq: Every day | ORAL | Status: DC

## 2013-12-10 MED ORDER — L-METHYLFOLATE-B6-B12 3-35-2 MG PO TABS: 1.0000 | ORAL_TABLET | Freq: Two times a day (BID) | ORAL | Status: DC

## 2013-12-10 NOTE — Telephone Encounter (Signed)
Patient's son is requesting rx for metanx and omeprazole. Pt uses [facility] right now, but will resume mail order in January.

## 2013-12-15 ENCOUNTER — Ambulatory Visit (INDEPENDENT_AMBULATORY_CARE_PROVIDER_SITE_OTHER)
Admission: RE | Admit: 2013-12-15 | Discharge: 2013-12-15 | Disposition: A | Discharge status: Home / Self Care | Payer: Medicare Other | Source: Ambulatory Visit | Attending: Family Medicine | Admitting: Family Medicine

## 2013-12-15 DIAGNOSIS — J189 Pneumonia, unspecified organism: Secondary | ICD-10-CM

## 2013-12-17 ENCOUNTER — Other Ambulatory Visit: Payer: Self-pay | Admitting: *Deleted

## 2013-12-17 MED ORDER — FENTANYL 12 MCG/HR TD PT72: 12.5000 ug | MEDICATED_PATCH | TRANSDERMAL | Status: DC

## 2013-12-17 NOTE — Telephone Encounter (Signed)
Patient's husband is requesting refill on fentanyl.  Last seen-12/08/2013  Last filled-11/06/2013  [facility]-not on filed. Contract not signed   Please advise. SW

## 2013-12-18 ENCOUNTER — Telehealth: Payer: Self-pay

## 2013-12-18 NOTE — Telephone Encounter (Signed)
Spoke with Kerry Albert and made him aware we were waiting on the mew pulse oximeter to come in so we can perform the functions to see if she qualifies for 02 during the day.  He voiced understanding.      KP

## 2013-12-18 NOTE — Telephone Encounter (Signed)
Message copied by Arnette Norris on Fri Dec 18, 2013 12:03 PM ------      Message from: Henderson Newcomer      Created: Tue Dec 08, 2013  3:32 PM       Jethro Poling.  We can't do at home daytime sat testing if it is to qualify for O2 since we are the O2 provider.That would be considered a conflict.  We used to partner with another company that provided this service for [place] and are currently looking for another partner.   We CAN provide this service if it is for anything other than to qualify for O2.  You will need to bring the pt in for sat testing then we can provide the O2.  Please let me know if you or Dr. Laury Axon have any questions.      Thank you!      ----- Message -----         From: Arnette Norris, CMA         Sent: 12/08/2013  11:56 AM           To: Melissa Stenson            Kawanna Christley is on 2 liters of 02 at night and due to her recent diagnosis of pneumonia, Dr.[name] is requesting to have 02 stats done at home during the day to see if the patient qualifies for the use of oxygen during the daytime. The patient will need a pediatric pulse oximeter for appropriate testing. I have faxed an order, plesae let me know if you need anything else. She is already established with [facility].                        Thanks      KP       ------

## 2014-01-15 ENCOUNTER — Telehealth: Payer: Self-pay | Admitting: *Deleted

## 2014-01-15 MED ORDER — GABAPENTIN 100 MG PO CAPS: ORAL_CAPSULE | ORAL | Status: DC

## 2014-01-15 NOTE — Telephone Encounter (Signed)
Patient's husband called and asked if a 30 day supply of gabapentin be sent to the local pharmacy until there mail order supply comes through. Rx sent and  patient notified.

## 2014-01-17 IMAGING — CR DG FOOT COMPLETE 3+V*R*
3 series · 3 of 3 positions shown · non-contrast
Comparison: No priors.

CLINICAL DATA: Osteomyelitis.  Foot pain. Open wound on 2nd digit
of right foot.

RIGHT FOOT COMPLETE - 3+ VIEW

[view not recorded (1 of 3)]
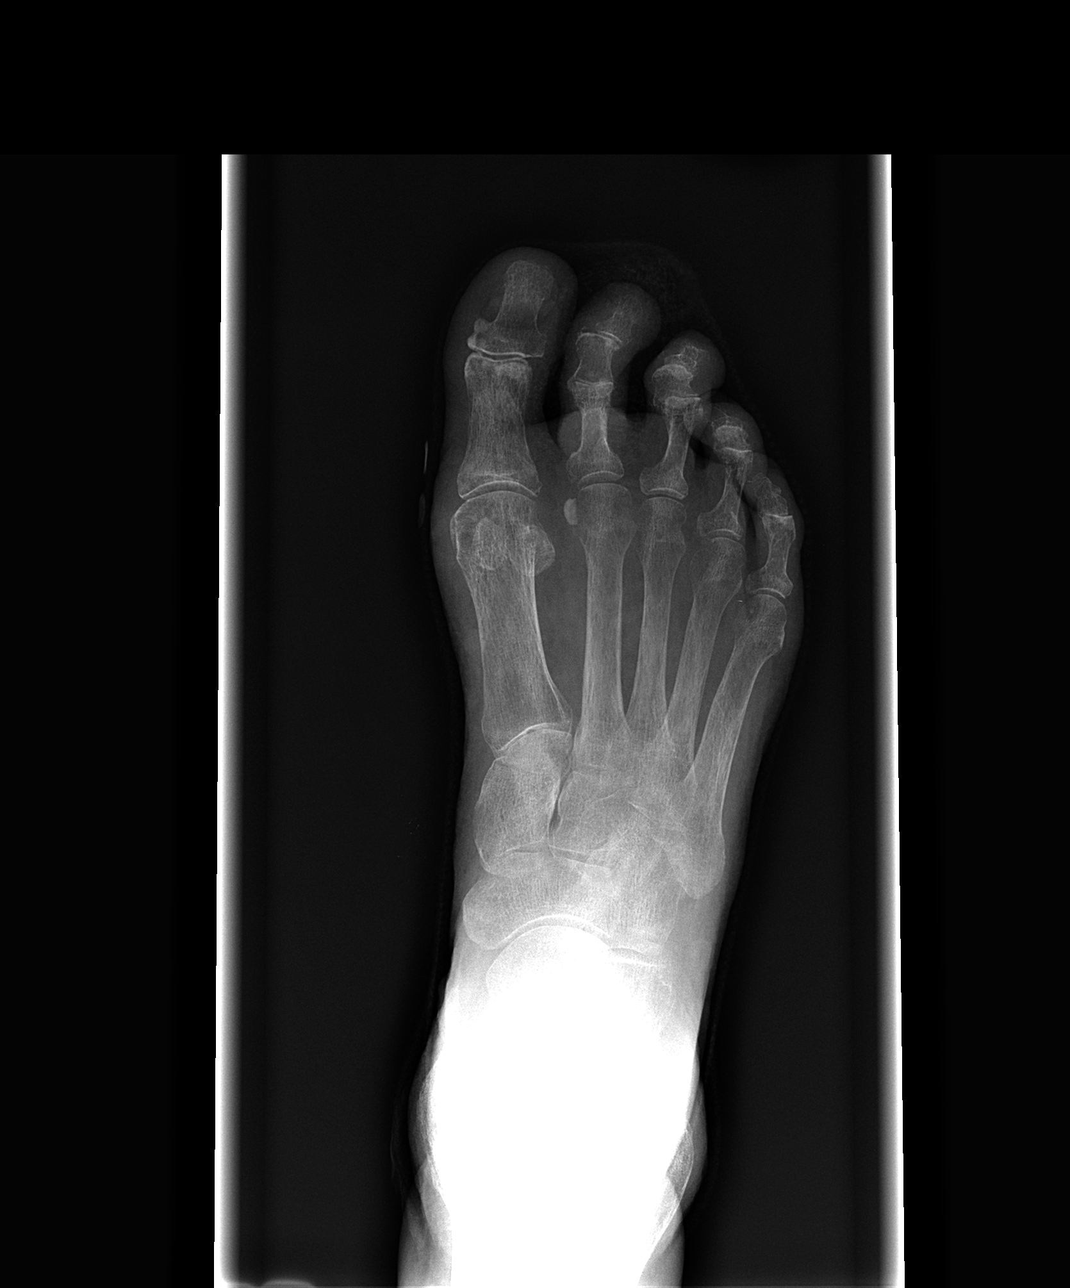

[view not recorded (2 of 3)]
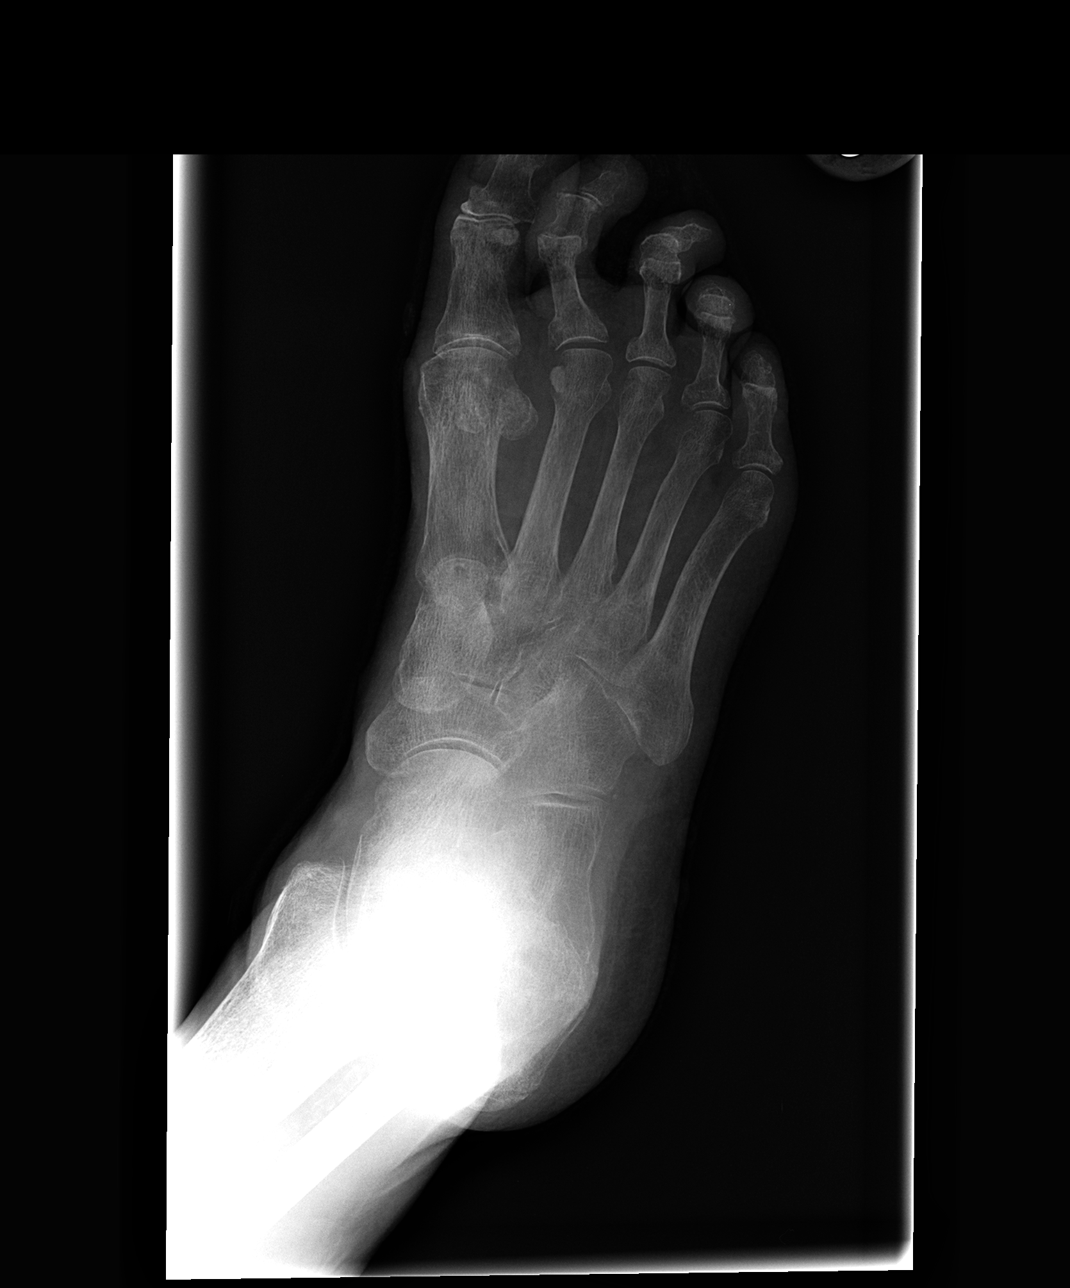

[view not recorded (3 of 3)]
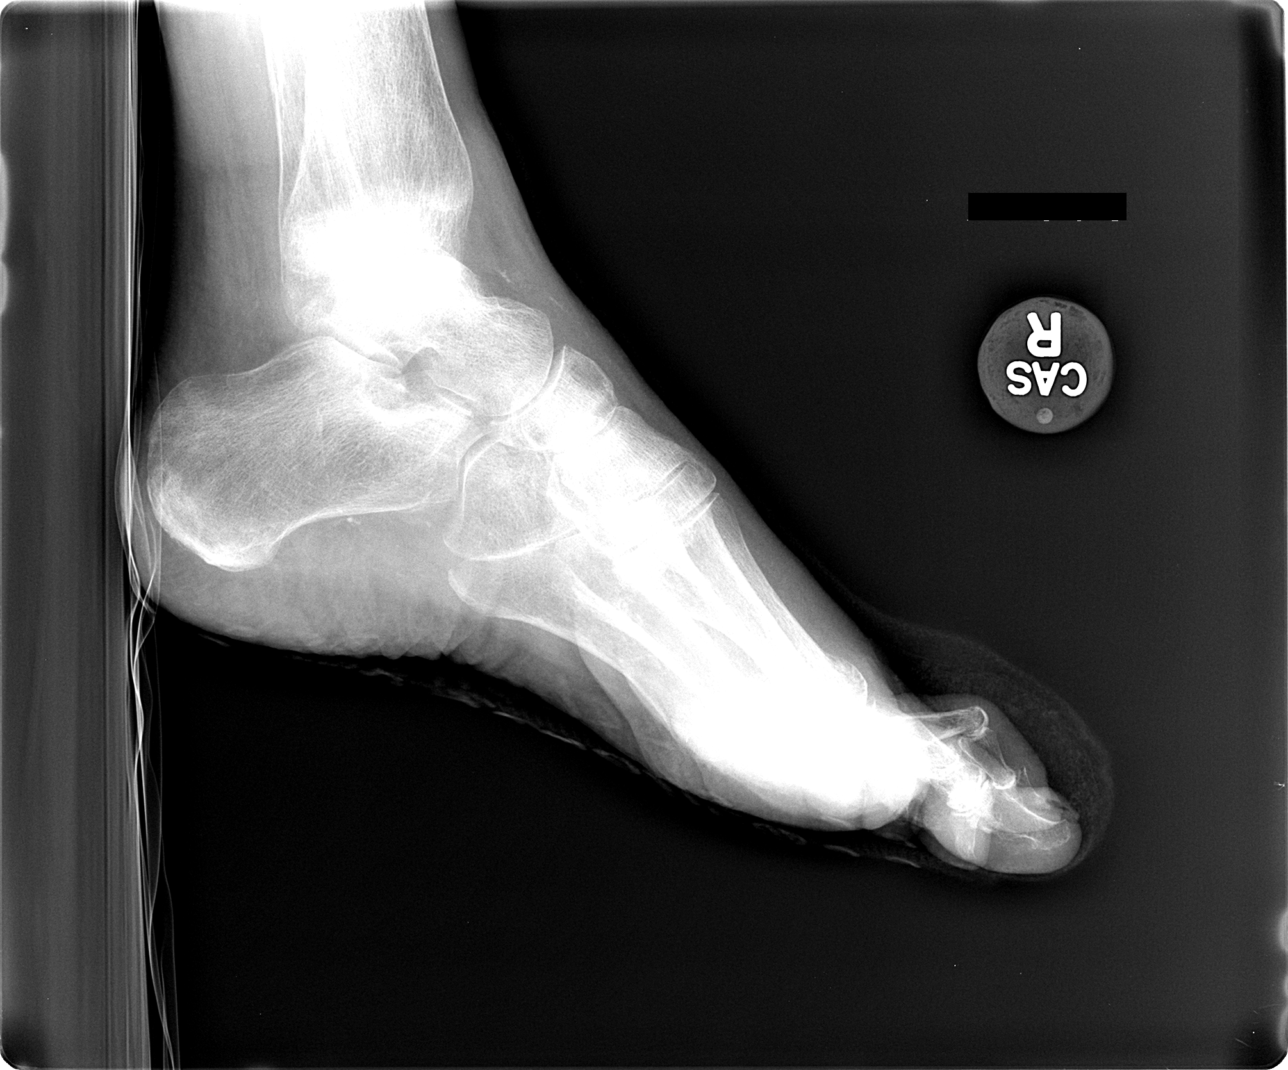

[3 of 3 positions shown; findings below may reference images not displayed]

FINDINGS: Diffuse osteopenia.  No definite areas of bony lysis are
noted to strongly suggest the presence of osteomyelitis of the
second toe, or any other portions of the right foot.  No acute
displaced fractures.  Degenerative changes of osteoarthritis are
noted throughout the phalanges, with lateral subluxation of the
second and third digits.
IMPRESSION: 1.  No gross evidence to suggest osteomyelitis on this plain film
examination.
2.  Severe osteopenia.

## 2014-01-18 ENCOUNTER — Telehealth: Payer: Self-pay | Admitting: *Deleted

## 2014-01-18 NOTE — Telephone Encounter (Signed)
Okay #10, no refills

## 2014-01-18 NOTE — Telephone Encounter (Signed)
Patient is requesting refill for fentanyl patches  Last seen-12/08/2013  Last filled-12/17/2013  [facility]-not on file, contract signed

## 2014-01-19 MED ORDER — FENTANYL 12 MCG/HR TD PT72: 12.5000 ug | MEDICATED_PATCH | TRANSDERMAL | Status: DC

## 2014-01-19 NOTE — Telephone Encounter (Signed)
Rx printed. Patient notified

## 2014-02-04 ENCOUNTER — Other Ambulatory Visit: Payer: Self-pay | Admitting: *Deleted

## 2014-02-08 ENCOUNTER — Other Ambulatory Visit: Payer: Self-pay | Admitting: *Deleted

## 2014-02-11 ENCOUNTER — Other Ambulatory Visit: Payer: Self-pay | Admitting: Family Medicine

## 2014-02-11 ENCOUNTER — Ambulatory Visit: Payer: Medicare Other | Admitting: Family Medicine

## 2014-02-15 ENCOUNTER — Ambulatory Visit (INDEPENDENT_AMBULATORY_CARE_PROVIDER_SITE_OTHER): Payer: Medicare Other | Admitting: Family Medicine

## 2014-02-15 ENCOUNTER — Encounter: Payer: Self-pay | Admitting: Family Medicine

## 2014-02-15 VITALS — BP 160/76 | HR 62 | Temp 97.7°F | Wt 88.4 lb

## 2014-02-15 DIAGNOSIS — I2789 Other specified pulmonary heart diseases: Secondary | ICD-10-CM

## 2014-02-15 DIAGNOSIS — R0902 Hypoxemia: Secondary | ICD-10-CM

## 2014-02-15 NOTE — Patient Instructions (Signed)
Oxygen Use at Home Oxygen can be prescribed for home use. The prescription will show the flow rate. This is how much oxygen is to be used per minute. This will be listed in liters per minute (LPM or L/M). A liter is a metric measurement of volume. You will use oxygen therapy as directed. It can be used while exercising, sleeping, or at rest. You may need oxygen continuously. Your health care provider may order a blood oxygen test (arterial blood gas or pulse oximetry test) that will show what your oxygen level is. Your health care provider will use these measurements to learn about your needs and follow your progress. Home oxygen therapy is commonly used on patients with various lung (pulmonary) related conditions. Some of these conditions include:  Asthma.  Lung cancer.  Pneumonia.  Emphysema.  Chronic bronchitis.  Cystic fibrosis.  Other lung diseases.  Pulmonary fibrosis.  Occupational lung disease.  Heart failure.  Chronic obstructive pulmonary disease (COPD). 3 COMMON WAYS OF PROVIDING OXYGEN THERAPY  Gas: The gas form of oxygen is put into variously sized cylinders or tanks. The cylinders or oxygen tanks contain compressed oxygen. The cylinder is equipped with a regulator that controls the flow rate. Because the flow of oxygen out of the cylinder is constant, an oxygen conserving device may be attached to the system to avoid waste. This device releases the gas only when you inhale and cuts it off when you exhale. Oxygen can be provided in a small cylinder that can be carried with you. Large tanks are heavy and are only for stationary use. After use, empty tanks must be exchanged for full tanks.  Liquid: The liquid form of oxygen is put into a container similar to a thermos. When released, the liquid converts to a gas and you breathe it in just like the compressed gas. This storage method takes up less space than the compressed gas cylinder, and you can transfer the liquid to a  small, portable vessel at home. Liquid oxygen is more expensive than the compressed gas, and the vessel vents when not in use. An oxygen conserving device may be built into the vessel to conserve the oxygen. Liquid oxygen is very cold, around 297 below zero.  Oxygen concentrator: This medical device filters oxygen from room air and gives almost 100% oxygen to the patient. Oxygen concentrators are powered by electricity. Benefits of this system are:  It does not need to be resupplied.  It is not as costly as liquid oxygen.  Extra tubing permits the user to move around easier. There are several types of small, portable oxygen systems available which can help you remain active and mobile. You must have a cylinder of oxygen as a backup in the event of a power failure. Advise your electric power company that you are on oxygen therapy in order to get priority service when there is a power failure. OXYGEN DELIVERY DEVICES There are 3 common ways to deliver oxygen to your body.  Nasal cannula. This is a 2-pronged device inserted in the nostrils that is connected to tubing carrying the oxygen. The tubing can rest on the ears or be attached to the frame of eyeglasses.  Mask. People who need a high flow of oxygen generally use a mask.  Transtracheal catheter. Transtracheal oxygen therapy requires the insertion of a small, flexible tube (catheter) in the windpipe (trachea). This catheter is held in place by a necklace. Since transtracheal oxygen bypasses the mouth, nose, and throat, a humidifier is   tube (catheter) in the windpipe (trachea). This catheter is held in place by a necklace. Since transtracheal oxygen bypasses the mouth, nose, and throat, a humidifier is absolutely required at flow rates of 1 LPM or greater.  OXYGEN USE SAFETY TIPS  · Never smoke while using oxygen. Oxygen does not burn or explode, but flammable materials will burn faster in the presence of oxygen.  · Keep a fire extinguisher close by. Let your fire department know that you have oxygen in your home.  · Warn visitors not to smoke near you when you are using oxygen. Put up "no smoking" signs in your  home where you most often use the oxygen.  · When you go to a restaurant with your portable oxygen source, ask to be seated in the nonsmoking section.  · Stay at least 5 feet away from gas stoves, candles, lighted fireplaces, or other heat sources.  · Do not use materials that burn easily (flammable) while using your oxygen.  · If you use an oxygen cylinder, make sure it is secured to some fixed object or in a stand. If you use liquid oxygen, make sure the vessel is kept upright to keep the oxygen from pouring out. Liquid oxygen is so cold it can hurt your skin.  · If you use an oxygen concentrator, call your electric company so you will be given priority service if your power goes out. Avoid using extension cords, if possible.  · Regularly test your smoke detectors at home to make sure they work. If you receive care in your home from a nurse or other health care provider, he or she may also check to make sure your smoke detectors work.  GUIDELINES FOR CLEANING YOUR EQUIPMENT  · Wash the nasal prongs with a liquid soap. Thoroughly rinse them once or twice a week.  · Replace the prongs every 2 to 4 weeks. If you have an infection (cold, pneumonia) change them when you are well.  · Your health care provider will give you instructions on how to clean your transtracheal catheter.  · The humidifier bottle should be washed with soap and warm water and rinsed thoroughly between each refill. Air-dry the bottle before filling it with sterile or distilled water. The bottle and its top should be disinfected after they are cleaned.  · If you use an oxygen concentrator, unplug the unit. Then wipe down the cabinet with a damp cloth and dry it daily. The air filter should be cleaned at least twice a week.  · Follow your home medical equipment and service company's directions for cleaning the compressor filter.  HOME CARE INSTRUCTIONS   · Do not change the flow of oxygen unless directed by your health care provider.  · Do not use  alcohol or other sedating drugs unless instructed. They slow your breathing rate.  · Do not use materials that burn easily (flammable) while using your oxygen.  · Always keep a spare tank of oxygen. Plan ahead for holidays when you may not be able to get a prescription filled.  · Use water-based lubricants on your lips or nostrils. Do not use an oil-based product like petroleum jelly.  · To prevent your cheeks or the skin behind your ears from becoming irritated, tuck some gauze under the tubing.  · If you have persistent redness under your nose, call your health care provider.  · When you no longer need oxygen, your doctor will have the oxygen discontinued. Oxygen is not addicting or   habit-forming.  · Use the oxygen as instructed. Too much oxygen can be harmful and too little will not give you the benefit you need.  · Shortness of breath is not always from a lack of oxygen. If your oxygen level is not the cause of your shortness of breath, taking oxygen will not help.  SEEK MEDICAL CARE IF:   · You have frequent headaches.  · You have shortness of breath or a lasting cough.  · You have anxiety.  · You are confused.  · You are drowsy or sleepy all the time.  · You develop an illness which aggravates your breathing.  · You cannot exercise.  · You are restless.  · You have blue lips or fingernails.  · You have difficult or irregular breathing and it is getting worse.  · You have a fever.  Document Released: 03/08/2004 Document Revised: 10/07/2013 Document Reviewed: 11/13/2007  Exit[employer]® Patient Information ©2014 [employer].

## 2014-02-15 NOTE — Progress Notes (Signed)
SATURATION QUALIFICATIONS: (This note is used to comply with regulatory documentation for home oxygen)  Patient Saturations on Room Air at Rest = 94%  Patient Saturations on Room Air while Ambulating = 81%  Patient Saturations on 2 Liters of oxygen while Ambulating = 100%  Please briefly explain why patient needs home oxygen: Patient is experiencing SOB with exertion and an 02 sat of 81%

## 2014-02-15 NOTE — Assessment & Plan Note (Signed)
Consider pulm referral but with pt age not sure how much benefit but will discuss with family

## 2014-02-15 NOTE — Progress Notes (Signed)
Pre visit review using our clinic review tool, if applicable. No additional management support is needed unless otherwise documented below in the visit note. 

## 2014-02-15 NOTE — Progress Notes (Signed)
Patient ID: Kerry Myers, female   DOB: Feb 21, 1919, 80 y.o.   MRN: [id number]   Subjective:    Patient ID: Kerry Myers, female    DOB: Dec 19, 1919, 80 y.o.   MRN: [id number] HPI Pt here with caretaker to check her O2 level to see if she qualifies for home O2.  No other new complaints.            Objective:    BP 160/76  Pulse 62  Temp(Src) 97.7 F (36.5 C) (Oral)  Wt 88 lb 6.4 oz (40.098 kg)  SpO2 94% General appearance: alert, cooperative, appears stated age and no distress Neck: no adenopathy, supple, symmetrical, trachea midline and thyroid not enlarged, symmetric, no tenderness/mass/nodules Lungs: clear to auscultation bilaterally Extremities: extremities normal, atraumatic, no cyanosis or edema       Assessment & Plan:  1. Hypoxemia Pt does qualify for home O2

## 2014-02-17 ENCOUNTER — Telehealth: Payer: Self-pay

## 2014-02-17 DIAGNOSIS — I272 Pulmonary hypertension, unspecified: Secondary | ICD-10-CM

## 2014-02-17 DIAGNOSIS — R0902 Hypoxemia: Secondary | ICD-10-CM

## 2014-02-17 NOTE — Telephone Encounter (Signed)
I discussed referring pt to pulm secondary to pulmonary htn and hypoxia. Secondary to pt age--not sure how much benefit but caretaker asked we inform pt son. Kerry Myers has been made aware and he voiced understanding.      KP

## 2014-02-17 NOTE — Telephone Encounter (Signed)
Message copied by Ewing Schlein on Wed Feb 17, 2014 12:58 PM ------      Message from: Rosalita Chessman      Created: Wed Feb 17, 2014 11:50 AM       Ok to give order O2 2 liters [place] ---continuous and portable option is desired      ----- Message -----         From: Ewing Schlein, CMA         Sent: 02/17/2014  11:48 AM           To: Rosalita Chessman, DO                        ----- Message -----         From: Darlina Guys         Sent: 02/15/2014   3:46 PM           To: Ewing Schlein, CMA            Yes, I think everything looks good Joelene Millin.  Can you have Dr. Etter Sjogren put an order in Epic for the O2?  It will need to include liter flow, frequency (continuous, exertional, etc), nasal cannula and whether or not the MD wants a portable option.  Just let me know when the order is ready and we'll pull everything together.      Thanks so much!!      ----- Message -----         From: Ewing Schlein, CMA         Sent: 02/15/2014  12:06 PM           To: Darlina Guys            We seen this patient in the office today and to qualify her for 02 at home. Could [facility] provide her with oxygen?                  Kim P             ------

## 2014-02-26 ENCOUNTER — Telehealth: Payer: Self-pay | Admitting: Family Medicine

## 2014-02-26 ENCOUNTER — Institutional Professional Consult (permissible substitution): Payer: Medicare Other | Admitting: Internal Medicine

## 2014-02-26 MED ORDER — FENTANYL 12 MCG/HR TD PT72: 12.5000 ug | MEDICATED_PATCH | TRANSDERMAL | Status: DC

## 2014-02-26 NOTE — Telephone Encounter (Signed)
Patient's husband called requesting rx for fentanyl. Call [phone] when ready.

## 2014-02-26 NOTE — Telephone Encounter (Signed)
Last seen 02/05/14 and filled 01/19/14 #10. Please advise      KP

## 2014-02-26 NOTE — Telephone Encounter (Signed)
Patient aware Rx ready for pick up.      KP 

## 2014-02-26 NOTE — Telephone Encounter (Signed)
Refill x1 

## 2014-03-04 ENCOUNTER — Encounter: Payer: Self-pay | Admitting: Internal Medicine

## 2014-03-04 ENCOUNTER — Ambulatory Visit (INDEPENDENT_AMBULATORY_CARE_PROVIDER_SITE_OTHER): Payer: Medicare Other | Admitting: Internal Medicine

## 2014-03-04 VITALS — BP 102/62 | HR 73 | Ht <= 58 in | Wt 90.2 lb

## 2014-03-04 DIAGNOSIS — R0989 Other specified symptoms and signs involving the circulatory and respiratory systems: Secondary | ICD-10-CM

## 2014-03-04 DIAGNOSIS — R0609 Other forms of dyspnea: Secondary | ICD-10-CM

## 2014-03-04 DIAGNOSIS — I2789 Other specified pulmonary heart diseases: Secondary | ICD-10-CM

## 2014-03-04 DIAGNOSIS — R0600 Dyspnea, unspecified: Secondary | ICD-10-CM

## 2014-03-04 DIAGNOSIS — I272 Pulmonary hypertension, unspecified: Secondary | ICD-10-CM

## 2014-03-04 MED ORDER — IPRATROPIUM-ALBUTEROL 0.5-2.5 (3) MG/3ML IN SOLN: 3.0000 mL | Freq: Four times a day (QID) | RESPIRATORY_TRACT | Status: DC | PRN

## 2014-03-04 NOTE — Patient Instructions (Addendum)
Use duoneb 4 times a day as needed Meet with [facility] to consider portable o2 systems Return in 2 months to see me or NP To report progess

## 2014-03-04 NOTE — Progress Notes (Signed)
Subjective:    Patient ID: Kerry Myers, female    DOB: 04-09-1919, 80 y.o.   MRN: [id number] PCP Garnet Koyanagi, DO  HPI  IOV 03/04/2014  Chief Complaint  Patient presents with  . Pulmonary Consult    for SOB with exertion mostly.  Pt denies any cough or congestion.     80 year old female brought in by her caretaker. She is a very poor historian and history is gained in talking to her caretaker. It appears that she has chronic dyspnea on exertion for the last 5 years or so. She's been on oxygen since then with exertion. But in the last few years she's had progressive dyspnea on exertion. This is manifested as tiredness but each time she complains of tiredness the check a pulse ox and is low in the 80s. She does use oxygen but she's not sure if this helps her. Portable system is only moderately helpful and the caretaker wishes of something lighter if possible.  Patient has chronic scoliosis and uses a walker. She wishes to be active. Most of the day she is normal mental status with normal cognition but occasionally forgetful. Caretaker feels she has mild dementia.   reports that she has never smoked. She has never used smokeless tobacco.   According to the patient and the caretaker they do not wish for extensive tests and prefer a symptom-based approach. A pulmonary function test would be very difficult for her to perform due to age/mild dementia and to interpret given scoliosis  CT chest and echocardiogram in 2014 show mild pulmonary hypertension, scoliosis and cor pulmonale   CT chest March 2014 IMPRESSION:  No evidence of pulmonary embolism.  Right basilar atelectasis with mild patchy infiltrate in right  middle and lower lobes.  Tiny bibasilar pleural effusions.  Large hiatal hernia.  Original Report Authenticated By: Lavonia Dana, M.D.    Echocardiogram December 2014  - Left ventricle: The cavity size was normal. Wall thickness was normal. Systolic function was normal. The  estimated ejection fraction was in the range of 50% to 55%. Wall motion was normal; there were no regional wall motion abnormalities. Doppler parameters are consistent with abnormal left ventricular relaxation (grade 1 diastolic dysfunction). Doppler parameters are consistent with elevated ventricular end-diastolic filling pressure. - Aortic valve: Mild regurgitation. - Right atrium: The atrium was moderately dilated. - Atrial septum: No defect or patent foramen ovale was identified. - Tricuspid valve: Moderate regurgitation. - Pulmonary arteries: PA peak pressure: 52mm Hg (S). - Pericardium, extracardiac: A trivial pericardial effusion was identified. There was a left pleural effusion.   Past Medical History  Diagnosis Date  . GERD (gastroesophageal reflux disease)   . Bladder incontinence   . Neuropathic pain   . RLS (restless legs syndrome)   . Arthritis   . LBBB (left bundle branch block)      No family history on file.   History   Social History  . Marital Status: Widowed    Spouse Name: N/A    Number of Children: N/A  . Years of Education: N/A   Occupational History  . Not on file.   Social History Main Topics  . Smoking status: Never Smoker   . Smokeless tobacco: Never Used  . Alcohol Use: No  . Drug Use: No  . Sexual Activity: Not on file   Other Topics Concern  . Not on file   Social History Narrative  . No narrative on file     No Known Allergies  Outpatient Prescriptions Prior to Visit  Medication Sig Dispense Refill  . albuterol (PROVENTIL HFA;VENTOLIN HFA) 108 (90 BASE) MCG/ACT inhaler Inhale 2 puffs into the lungs every 6 (six) hours as needed for wheezing.  1 Inhaler  0  . Ascorbic Acid (VITAMIN C) 1000 MG tablet Take 1,000 mg by mouth daily.      . calcium carbonate (OS-CAL) 600 MG TABS Take 600 mg by mouth daily.      . cefUROXime (CEFTIN) 250 MG tablet Take 1 tablet (250 mg total) by mouth 2 (two) times daily with a meal.  8 tablet  0   . Cholecalciferol (VITAMIN D3) 5000 UNITS CAPS Take 1 capsule by mouth daily.      [name] Calcium (STOOL SOFTENER PO) Take 2 tablets by mouth 2 (two) times daily.      . fentaNYL (DURAGESIC - DOSED MCG/HR) 12 MCG/HR Place 1 patch (12.5 mcg total) onto the skin every 3 (three) days.  10 patch  0  . gabapentin (NEURONTIN) 100 MG capsule Pt takes 200 mg in the morning. 200 mg at 2pm.  100 mg at bedtime  150 capsule  0  . l-methylfolate-B6-B12 (METANX) 3-35-2 MG TABS Take 1 tablet by mouth 2 (two) times daily.  60 tablet  1  . Multiple Vitamin (MULTIVITAMIN WITH MINERALS) TABS Take 1 tablet by mouth daily.      . Omega-3 Fatty Acids (FISH OIL) 1000 MG CPDR Take 1,000 mg by mouth daily.      Marland Kitchen omeprazole (PRILOSEC) 40 MG capsule Take 1 capsule (40 mg total) by mouth at bedtime.  90 capsule  3  . oxybutynin (DITROPAN-XL) 10 MG 24 hr tablet Take 1 tablet (10 mg total) by mouth daily.  30 tablet  5  . Oxygen Permeable Lens Products SOLN 2 L by Does not apply route daily.    0  . polyethylene glycol powder (GLYCOLAX/MIRALAX) powder Take 17 g by mouth every morning.       . pramipexole (MIRAPEX) 0.5 MG tablet Take 1 tablet (0.5 mg  total) by mouth at bedtime.  90 tablet  1  . traMADol (ULTRAM) 50 MG tablet Take 1 tablet (50 mg total) by mouth 2 (two) times daily as needed for pain.  180 tablet  0   No facility-administered medications prior to visit.      Review of Systems  Constitutional: Negative for fever and unexpected weight change.  HENT: Negative for congestion, dental problem, ear pain, nosebleeds, postnasal drip, rhinorrhea, sinus pressure, sneezing, sore throat and trouble swallowing.   Eyes: Negative for redness and itching.  Respiratory: Positive for shortness of breath. Negative for cough, chest tightness and wheezing.   Cardiovascular: Negative for palpitations and leg swelling.  Gastrointestinal: Negative for nausea and vomiting.  Genitourinary: Negative for dysuria.   Musculoskeletal: Negative for joint swelling.  Skin: Negative for rash.  Neurological: Negative for headaches.  Hematological: Does not bruise/bleed easily.  Psychiatric/Behavioral: Negative for dysphoric mood. The patient is not nervous/anxious.        Objective:   Physical Exam  Vitals reviewed. Constitutional: She is oriented to person, place, and time. She appears well-developed and well-nourished. No distress.  Body mass index is 21.74 kg/(m^2). Very scoliotic  HENT:  Head: Normocephalic and atraumatic.  Right Ear: External ear normal.  Left Ear: External ear normal.  Mouth/Throat: Oropharynx is clear and moist. No oropharyngeal exudate.  Eyes: Conjunctivae and EOM are normal. Pupils are equal, round, and reactive to light. Right eye  exhibits no discharge. Left eye exhibits no discharge. No scleral icterus.  Neck: Normal range of motion. Neck supple. No JVD present. No tracheal deviation present. No thyromegaly present.  Cardiovascular: Normal rate, regular rhythm, normal heart sounds and intact distal pulses.  Exam reveals no gallop and no friction rub.   No murmur heard. Pulmonary/Chest: Effort normal and breath sounds normal. No respiratory distress. She has no wheezes. She has no rales. She exhibits no tenderness.  barrell chest due to kypho scoliosis  Abdominal: Soft. Bowel sounds are normal. She exhibits no distension and no mass. There is no tenderness. There is no rebound and no guarding.  Musculoskeletal: Normal range of motion. She exhibits no edema and no tenderness.  Very scoliotic Uses walker  Lymphadenopathy:    She has no cervical adenopathy.  Neurological: She is alert and oriented to person, place, and time. She has normal reflexes. No cranial nerve deficit. She exhibits normal muscle tone. Coordination normal.  Skin: Skin is warm and dry. No rash noted. She is not diaphoretic. No erythema. No pallor.  Psychiatric:  Quiet Pleasant Mild dementia           Assessment & Plan:

## 2014-03-05 ENCOUNTER — Telehealth: Payer: Self-pay | Admitting: Internal Medicine

## 2014-03-05 DIAGNOSIS — I272 Pulmonary hypertension, unspecified: Secondary | ICD-10-CM

## 2014-03-05 NOTE — Telephone Encounter (Signed)
Spoke with Melissa. She states that Medicare will not pay for nebulizer and medications with the dx of pulmonary hypertension. Melissa states the only dx codes they will accept will be COPD, Asthma or Chronic Bronchitis. I don't see any of this mentioned in the pt's OV note.  MR - please advise. Thanks.

## 2014-03-05 NOTE — Telephone Encounter (Signed)
What about chronic resp failure?  Dr. Brand Males, M.D., [facility].C.P Pulmonary and Critical Care Medicine Staff Physician [place] [facility] Pager: [phone], If no answer or between  15:00h - 7:00h: call [phone]  03/05/2014 5:18 PM

## 2014-03-05 NOTE — Telephone Encounter (Signed)
No we tried chronic resp failure and they will not accept this either. Derby Bing, CMA

## 2014-03-05 NOTE — Telephone Encounter (Signed)
LMTCB

## 2014-03-06 DIAGNOSIS — R0600 Dyspnea, unspecified: Secondary | ICD-10-CM | POA: Insufficient documentation

## 2014-03-06 NOTE — Assessment & Plan Note (Signed)
Dyspnea likely ffrom deconditioning, scoliosis related cor pulmonale. By history and CT chest no evidecne of ILD or obstructive lung disease. They are rightfully against invasive testing and prefer symptom based approach.Treatment options are limited.  I will try her on lighter portable o2 system and try to get her empiric broncho-dilators  Caretaker agreeable with plan

## 2014-03-12 NOTE — Telephone Encounter (Signed)
Medicare had tightened up. Will they accept dyspnea? IF not, patient family can buy out of pocket one week worth of albuterol bid prn x 7 days and see if this helps. Of course they have to decide cost  Dr. Brand Males, M.D., [facility].C.P Pulmonary and Critical Care Medicine Staff Physician [place] [facility] Pager: [phone], If no answer or between  15:00h - 7:00h: call [phone]  03/12/2014 11:54 AM

## 2014-03-12 NOTE — Telephone Encounter (Signed)
lmtcb x1 for melissa 

## 2014-03-16 NOTE — Telephone Encounter (Signed)
[facility] returning call can be reached @ [phone].Hillery Hunter

## 2014-03-16 NOTE — Telephone Encounter (Signed)
Called spoke with [facility], she stated that Dyspnea will NOT work as this diagnosis is viewed as a symptom rather than a chronic problem/diagnosis.  Advised Melissa of MR's recs regarding trying the albuterol neb out of pocket.  Will call pt's family.  **Albuterol AND Ipratropium neb soln are BOTH on the [facility] $4 list for #25 vials  LMOM TCB x1 on home number to discuss. ATC pt's son [name] at the mobile # - he was operating a piece of equipment and unable to talk.  He stated he will call back.

## 2014-03-16 NOTE — Telephone Encounter (Signed)
[name] returned call [phone]

## 2014-03-16 NOTE — Telephone Encounter (Signed)
Called spoke with Kerry Myers to discuss pt's duoneb.  Per Kerry Myers, he would prefer to see if [facility] carries this medication or [facility]; he does not want to go to [facility].  Kerry Myers will call the pharmacy to check and call him back.  Called [facility] and spoke with pharmacist Kerry Myers.  Per Kerry Myers, they typically match the [facility] $4 list and can provide Duoneb #60 vials for $24.98 cash pay. Will need rx.  Called spoke with Kerry Myers again to inform him of the above.  Kerry Myers very happy with this price.  Pt has never been on a neb before and therefore does not have a neb machine -- this still needs to be provided.  Kerry Myers, Medicare is still requiring a diagnosis other than dyspnea, hypoxemia, resp failure.  Kerry Myers w/ [facility] is checking on the cash price for a neb machine.  Per your 3.5.15 ov note: Dyspnea - Brand Males, MD at 03/06/2014 8:56 AM     Status: Written Related Problem: Dyspnea    Dyspnea likely ffrom deconditioning, scoliosis related cor pulmonale. By history and CT chest no evidecne of ILD or obstructive lung disease. They are rightfully against invasive testing and prefer symptom based approach.Treatment options are limited. I will try her on lighter portable o2 system and try to get her empiric broncho-dilators  Caretaker agreeable with plan   If Medicare will cover Cor Pulmonale, can this diagnosis be used?  Thanks.

## 2014-03-17 MED ORDER — IPRATROPIUM-ALBUTEROL 0.5-2.5 (3) MG/3ML IN SOLN
3.0000 mL | Freq: Four times a day (QID) | RESPIRATORY_TRACT | Status: DC | PRN
Start: 2014-03-17 — End: 2014-11-04

## 2014-03-17 NOTE — Telephone Encounter (Signed)
Melissa w/ [facility] returned call.  States that the 2 diagnosis given will not cover the cost of pt's neb meds.  Out of pocket cost will be $60-$75.  Satira Anis

## 2014-03-17 NOTE — Telephone Encounter (Signed)
I spoke with Melissa and she states she thinks it can still be delivered and will get this done for the pt. Nothing further needed. Bridgeton Bing, CMA

## 2014-03-17 NOTE — Telephone Encounter (Signed)
I spoke with pt son and advised that neb machine will be 60-$75. He is ok with this. Rx has been sent to [facility]. I have LMTCB with Melissa to see what they need to do to get the machine? Will [facility] still deliver it? Tropic Bing, CMA

## 2014-03-20 ENCOUNTER — Encounter: Payer: Self-pay | Admitting: *Deleted

## 2014-03-26 ENCOUNTER — Other Ambulatory Visit: Payer: Self-pay

## 2014-03-26 MED ORDER — OXYBUTYNIN CHLORIDE ER 10 MG PO TB24: 10.0000 mg | ORAL_TABLET | Freq: Every day | ORAL | Status: DC

## 2014-04-08 ENCOUNTER — Telehealth: Payer: Self-pay | Admitting: Family Medicine

## 2014-04-08 MED ORDER — FENTANYL 12 MCG/HR TD PT72: 12.5000 ug | MEDICATED_PATCH | TRANSDERMAL | Status: DC

## 2014-04-08 NOTE — Telephone Encounter (Signed)
04/08/14  Pt's husband called in requesting a refill on rx fentaNYL (DURAGESIC - DOSED MCG/HR) 12 MCG/HR

## 2014-04-08 NOTE — Telephone Encounter (Signed)
Spoke with son [name] and made him aware Rx is ready fir pick up.      KP

## 2014-04-08 NOTE — Telephone Encounter (Signed)
Last seen 02/15/14 and filled 02/26/14 #10. Please advise     KP

## 2014-04-08 NOTE — Telephone Encounter (Signed)
Refill x1 

## 2014-04-15 ENCOUNTER — Encounter (HOSPITAL_BASED_OUTPATIENT_CLINIC_OR_DEPARTMENT_OTHER): Payer: Medicare Other | Attending: Internal Medicine

## 2014-04-23 ENCOUNTER — Telehealth: Payer: Self-pay | Admitting: Internal Medicine

## 2014-04-23 NOTE — Telephone Encounter (Signed)
Called and spoke with pts caregiver and she is aware that MR wanted to see the pt back in 2 months so they were advised to keep this appt on Monday.

## 2014-04-26 ENCOUNTER — Encounter: Payer: Self-pay | Admitting: Internal Medicine

## 2014-04-26 ENCOUNTER — Ambulatory Visit (INDEPENDENT_AMBULATORY_CARE_PROVIDER_SITE_OTHER): Payer: Medicare Other | Admitting: Internal Medicine

## 2014-04-26 VITALS — BP 130/68 | HR 68 | Ht <= 58 in | Wt 85.0 lb

## 2014-04-26 DIAGNOSIS — R0602 Shortness of breath: Secondary | ICD-10-CM

## 2014-04-26 NOTE — Progress Notes (Signed)
Subjective:    Patient ID: Kerry Myers, female    DOB: May 13, 1919, 80 y.o.   MRN: [id number]  HPI  IOV 03/04/2014  Chief Complaint  Patient presents with  . Pulmonary Consult    for SOB with exertion mostly.  Pt denies any cough or congestion.     80 year old female brought in by her caretaker. She is a very poor historian and history is gained in talking to her caretaker. It appears that she has chronic dyspnea on exertion for the last 5 years or so. She's been on oxygen since then with exertion. But in the last few years she's had progressive dyspnea on exertion. This is manifested as tiredness but each time she complains of tiredness the check a pulse ox and is low in the 80s. She does use oxygen but she's not sure if this helps her. Portable system is only moderately helpful and the caretaker wishes of something lighter if possible.  Patient has chronic scoliosis and uses a walker. She wishes to be active. Most of the day she is normal mental status with normal cognition but occasionally forgetful. Caretaker feels she has mild dementia.   reports that she has never smoked. She has never used smokeless tobacco.   According to the patient and the caretaker they do not wish for extensive tests and prefer a symptom-based approach. A pulmonary function test would be very difficult for her to perform due to age/mild dementia and to interpret given scoliosis  CT chest and echocardiogram in 2014 show mild pulmonary hypertension, scoliosis and cor pulmonale   CT chest March 2014 IMPRESSION:  No evidence of pulmonary embolism.  Right basilar atelectasis with mild patchy infiltrate in right  middle and lower lobes.  Tiny bibasilar pleural effusions.  Large hiatal hernia.  Original Report Authenticated By: Lavonia Dana, M.D.    Echocardiogram December 2014  - Left ventricle: The cavity size was normal. Wall thickness was normal. Systolic function was normal. The estimated ejection  fraction was in the range of 50% to 55%. Wall motion was normal; there were no regional wall motion abnormalities. Doppler parameters are consistent with abnormal left ventricular relaxation (grade 1 diastolic dysfunction). Doppler parameters are consistent with elevated ventricular end-diastolic filling pressure. - Aortic valve: Mild regurgitation. - Right atrium: The atrium was moderately dilated. - Atrial septum: No defect or patent foramen ovale was identified. - Tricuspid valve: Moderate regurgitation. - Pulmonary arteries: PA peak pressure: 82mm Hg (S). - Pericardium, extracardiac: A trivial pericardial effusion was identified. There was a left pleural effusion.   REC Use duoneb 4 times a day as needed Meet with [facility] to consider portable o2 systems Return in 2 months to see me or NP To report progess   OV 04/26/2014  Chief Complaint  Patient presents with  . Follow-up    for SOB. Pt states SOB is improved.     Follow dyspnea that is due to scoliosis, Pulmicort and she had loss of muscle mass due to aging. Patient is on a palliative approach.  Last visit instead oxygen therapy at night and also nebulizers as needed. This is helped. Today they tell me that more than dyspnea is fatigued when she pushes her stroller. The daughter reckons that because she does not have any muscle mass he is just very fatigued. Again emphasized that she only once a palliative approach. There no new issues.    Past medical history: She is muscle carcinoma of the hand and she status post  ointment therapy    Review of Systems  Constitutional: Negative for fever and unexpected weight change.  HENT: Negative for congestion, dental problem, ear pain, nosebleeds, postnasal drip, rhinorrhea, sinus pressure, sneezing, sore throat and trouble swallowing.   Eyes: Negative for redness and itching.  Respiratory: Negative for cough, chest tightness, shortness of breath and wheezing.   Cardiovascular:  Negative for palpitations and leg swelling.  Gastrointestinal: Negative for nausea and vomiting.  Genitourinary: Negative for dysuria.  Musculoskeletal: Negative for joint swelling.  Skin: Negative for rash.  Neurological: Negative for headaches.  Hematological: Does not bruise/bleed easily.  Psychiatric/Behavioral: Negative for dysphoric mood. The patient is not nervous/anxious.        Objective:   Physical Exam  Vitals reviewed. Constitutional: She is oriented to person, place, and time. She appears well-developed and well-nourished. No distress.  Body mass index is 21.74 kg/(m^2). Very scoliotic  HENT:  Head: Normocephalic and atraumatic.  Right Ear: External ear normal.  Left Ear: External ear normal.  Mouth/Throat: Oropharynx is clear and moist. No oropharyngeal exudate.  Eyes: Conjunctivae and EOM are normal. Pupils are equal, round, and reactive to light. Right eye exhibits no discharge. Left eye exhibits no discharge. No scleral icterus.  Neck: Normal range of motion. Neck supple. No JVD present. No tracheal deviation present. No thyromegaly present.  Cardiovascular: Normal rate, regular rhythm, normal heart sounds and intact distal pulses.  Exam reveals no gallop and no friction rub.   No murmur heard. Pulmonary/Chest: Effort normal and breath sounds normal. No respiratory distress. She has no wheezes. She has no rales. She exhibits no tenderness.  barrell chest due to kypho scoliosis  Abdominal: Soft. Bowel sounds are normal. She exhibits no distension and no mass. There is no tenderness. There is no rebound and no guarding.  Musculoskeletal: Normal range of motion. She exhibits no edema and no tenderness.  Very scoliotic Uses walker  Lymphadenopathy:    She has no cervical adenopathy.  Neurological: She is alert and oriented to person, place, and time. She has normal reflexes. No cranial nerve deficit. She exhibits normal muscle tone. Coordination normal.  Skin: Skin is  warm and dry. No rash noted. She is not diaphoretic. No erythema. No pallor.  Psychiatric:  Quiet Pleasant Mild dementia        Assessment & Plan:

## 2014-04-26 NOTE — Patient Instructions (Signed)
Glad you are doing better Not sure we can fix shortness of breath and fatigue completely  Use oxygen at night Use breathing treatment as needed REturn as needed If shortness of breath every got to be a big problem;  -  make a visit and we can discuss oral morphine

## 2014-04-28 NOTE — Assessment & Plan Note (Signed)
Glad you are doing better Not sure we can fix shortness of breath and fatigue completely due to chronic problems of scoliosis, related pulmonary hypertension and age related cachexia with muscle loss Use oxygen at night Use breathing treatment as needed REturn as needed If shortness of breath every got to be a big problem;  -  make a visit and we can discuss oral morphine  (> 50% of this 15 min visit spent in face to face counseling)

## 2014-05-13 ENCOUNTER — Telehealth: Payer: Self-pay | Admitting: Family Medicine

## 2014-05-13 MED ORDER — FENTANYL 12 MCG/HR TD PT72: 12.5000 ug | MEDICATED_PATCH | TRANSDERMAL | Status: DC

## 2014-05-13 NOTE — Telephone Encounter (Signed)
Last seen 02/15/14 and filled 04/08/14 #10. Please advise    KP

## 2014-05-13 NOTE — Telephone Encounter (Signed)
[name] aware Rx ready for pick up.       KP 

## 2014-05-13 NOTE — Telephone Encounter (Signed)
Clever for #10, no refill

## 2014-05-13 NOTE — Telephone Encounter (Signed)
Caller name: Coy Vandoren Relation to WK:GSUPJSR Call back number: [phone] Pharmacy:  Reason for call:  To request a refill for fentaNYL (DURAGESIC - DOSED MCG/HR) 12 MCG/HR for his wife. Patient would like for [place] to contact him when its reasy.

## 2014-05-17 ENCOUNTER — Other Ambulatory Visit: Payer: Self-pay | Admitting: Family Medicine

## 2014-05-17 MED ORDER — OXYBUTYNIN CHLORIDE ER 10 MG PO TB24: 10.0000 mg | ORAL_TABLET | Freq: Every day | ORAL | Status: DC

## 2014-05-26 ENCOUNTER — Other Ambulatory Visit: Payer: Self-pay | Admitting: *Deleted

## 2014-05-26 MED ORDER — OXYBUTYNIN CHLORIDE ER 10 MG PO TB24: 10.0000 mg | ORAL_TABLET | Freq: Every day | ORAL | Status: DC

## 2014-06-09 ENCOUNTER — Telehealth: Payer: Self-pay | Admitting: Family Medicine

## 2014-06-09 NOTE — Telephone Encounter (Signed)
Caller name: Takiera Mayo  Relation to pt: husband Call back number: [phone] Pharmacy:  Reason for call: patient husband called to request refills for Gabapentin and Metanx. Husbands states that Dr Erling Cruz usually prescribes her Gabapentin and Metanx but he has now retired.  Please advise.

## 2014-06-09 NOTE — Telephone Encounter (Signed)
Please advise if refill appropriate.      KP 

## 2014-06-10 MED ORDER — L-METHYLFOLATE-B6-B12 3-35-2 MG PO TABS: 1.0000 | ORAL_TABLET | Freq: Two times a day (BID) | ORAL | Status: DC

## 2014-06-10 MED ORDER — GABAPENTIN 100 MG PO CAPS: ORAL_CAPSULE | ORAL | Status: DC

## 2014-06-10 NOTE — Telephone Encounter (Signed)
Refill both x 6 months 

## 2014-06-10 NOTE — Telephone Encounter (Signed)
Spoke with patient and he wanted to have a 30 day supply of the medication sent to [facility] and a 60 day supply sent to [facility]. Med's have been sent    KP

## 2014-06-23 ENCOUNTER — Encounter (HOSPITAL_BASED_OUTPATIENT_CLINIC_OR_DEPARTMENT_OTHER): Payer: Medicare Other | Attending: General Surgery

## 2014-06-23 DIAGNOSIS — M1990 Unspecified osteoarthritis, unspecified site: Secondary | ICD-10-CM | POA: Insufficient documentation

## 2014-06-23 DIAGNOSIS — G589 Mononeuropathy, unspecified: Secondary | ICD-10-CM | POA: Insufficient documentation

## 2014-06-23 DIAGNOSIS — S98139A Complete traumatic amputation of one unspecified lesser toe, initial encounter: Secondary | ICD-10-CM | POA: Insufficient documentation

## 2014-06-23 DIAGNOSIS — Z85828 Personal history of other malignant neoplasm of skin: Secondary | ICD-10-CM | POA: Insufficient documentation

## 2014-06-23 DIAGNOSIS — Z90710 Acquired absence of both cervix and uterus: Secondary | ICD-10-CM | POA: Insufficient documentation

## 2014-06-23 DIAGNOSIS — S91309A Unspecified open wound, unspecified foot, initial encounter: Secondary | ICD-10-CM | POA: Insufficient documentation

## 2014-06-23 DIAGNOSIS — X58XXXA Exposure to other specified factors, initial encounter: Secondary | ICD-10-CM | POA: Insufficient documentation

## 2014-06-23 DIAGNOSIS — M810 Age-related osteoporosis without current pathological fracture: Secondary | ICD-10-CM | POA: Insufficient documentation

## 2014-06-23 NOTE — Progress Notes (Signed)
[facility]  NAME:  Kerry Myers, Kerry Myers NO.:  [id number]  MEDICAL RECORD NO.:  [id number]      DATE OF BIRTH:  1919-03-24  PHYSICIAN:  Judene Companion, M.D.           VISIT DATE:                                  OFFICE VISIT   She is a 80 year old lady who has come here many times for different ulcers on her legs.  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  She has a diagnosis of osteoarthritis, osteoporosis, and neuropathy.  When she came here today, she was afebrile.  Her blood pressure was 125/69, pulse 67, temperature 98.  She is only 83 pounds and is 4 foot 10. The lesion that she came with was 3 cm, what looks like a laceration on the dorsal aspect of her right foot.  This was biopsied recently as dermatologist thought it might be  squamous cell carcinoma and obviously is not a squamous cell but probably is more of a chance of being a traumatic wound.  She does have adequate pulses and we are going to treat this today with silver collagen and see her back in a week.  They will change the dressings 3 times a week and put a new silver collagen.  DIAGNOSIS:  Neuropathy with probable traumatic wound to the dorsal aspect of her right leg, also a history of of neuropathy and history of multiple squamous cell carcinomas.     Judene Companion, M.D.     PP/MEDQ  D:  06/23/2014  T:  06/23/2014  Job:  [id number]

## 2014-06-28 ENCOUNTER — Telehealth: Payer: Self-pay | Admitting: Family Medicine

## 2014-06-28 MED ORDER — FENTANYL 12 MCG/HR TD PT72: 12.5000 ug | MEDICATED_PATCH | TRANSDERMAL | Status: DC

## 2014-06-28 NOTE — Telephone Encounter (Signed)
Last seen 02/15/14 and filled 05/13/14 #10. Please advise     KP

## 2014-06-28 NOTE — Telephone Encounter (Signed)
Refill x1 

## 2014-06-28 NOTE — Telephone Encounter (Signed)
Patient aware his mother's Rx is ready for pick up.      KP

## 2014-06-28 NOTE — Telephone Encounter (Signed)
Caller name: [name] Relation to pt: husband Call back number: [phone] Pharmacy:   Reason for call:  Patient's husband called to request a refill for fentaNYL (DURAGESIC - DOSED MCG/HR) 12 MCG/HR

## 2014-06-30 ENCOUNTER — Encounter (HOSPITAL_BASED_OUTPATIENT_CLINIC_OR_DEPARTMENT_OTHER): Payer: Medicare Other | Attending: General Surgery

## 2014-06-30 DIAGNOSIS — X58XXXA Exposure to other specified factors, initial encounter: Secondary | ICD-10-CM | POA: Diagnosis not present

## 2014-06-30 DIAGNOSIS — S91309A Unspecified open wound, unspecified foot, initial encounter: Secondary | ICD-10-CM | POA: Diagnosis not present

## 2014-07-01 ENCOUNTER — Encounter (HOSPITAL_BASED_OUTPATIENT_CLINIC_OR_DEPARTMENT_OTHER): Payer: Medicare Other

## 2014-07-07 DIAGNOSIS — S91309A Unspecified open wound, unspecified foot, initial encounter: Secondary | ICD-10-CM | POA: Diagnosis not present

## 2014-07-13 ENCOUNTER — Telehealth: Payer: Self-pay | Admitting: *Deleted

## 2014-07-13 DIAGNOSIS — Z0279 Encounter for issue of other medical certificate: Secondary | ICD-10-CM

## 2014-07-13 NOTE — Telephone Encounter (Signed)
Received completed and signed FMLA form from Dr. Etter Sjogren.  All forms faxed to [place] [employer] [place] at [phone]). Confirmation received.//AB/CMA

## 2014-07-14 DIAGNOSIS — S91309A Unspecified open wound, unspecified foot, initial encounter: Secondary | ICD-10-CM | POA: Diagnosis not present

## 2014-07-21 DIAGNOSIS — S91309A Unspecified open wound, unspecified foot, initial encounter: Secondary | ICD-10-CM | POA: Diagnosis not present

## 2014-07-30 DIAGNOSIS — S91309A Unspecified open wound, unspecified foot, initial encounter: Secondary | ICD-10-CM | POA: Diagnosis not present

## 2014-08-24 ENCOUNTER — Telehealth: Payer: Self-pay | Admitting: Family Medicine

## 2014-08-24 MED ORDER — PRAMIPEXOLE DIHYDROCHLORIDE 0.5 MG PO TABS: ORAL_TABLET | ORAL | Status: DC

## 2014-08-24 MED ORDER — OMEPRAZOLE 40 MG PO CPDR: 40.0000 mg | DELAYED_RELEASE_CAPSULE | Freq: Every day | ORAL | Status: DC

## 2014-08-24 NOTE — Telephone Encounter (Signed)
Caller name: [name]  Relation to pt:  Child / caretaker  Call back number: [phone] Pharmacy: [facility] [phone]    Reason for call:   requesting refill pramipexole (MIRAPEX) 0.5 MG tablet & omeprazole (PRILOSEC) 40 MG capsule

## 2014-08-24 NOTE — Telephone Encounter (Signed)
Rx faxed.    KP 

## 2014-08-27 ENCOUNTER — Other Ambulatory Visit: Payer: Self-pay

## 2014-08-27 ENCOUNTER — Telehealth: Payer: Self-pay

## 2014-08-27 DIAGNOSIS — I272 Pulmonary hypertension, unspecified: Secondary | ICD-10-CM

## 2014-08-27 MED ORDER — FENTANYL 12 MCG/HR TD PT72: 12.5000 ug | MEDICATED_PATCH | TRANSDERMAL | Status: DC

## 2014-08-27 NOTE — Telephone Encounter (Signed)
Spouse called [facility] and needs refill for patient's fentanyl. New Rx handwritten by Dr Etter Sjogren. Spouse picked-up Rx at the [facility] office Friday 8/28.

## 2014-09-08 ENCOUNTER — Other Ambulatory Visit: Payer: Self-pay | Admitting: Family Medicine

## 2014-09-10 ENCOUNTER — Telehealth: Payer: Self-pay

## 2014-09-10 MED ORDER — TRAMADOL HCL 50 MG PO TABS: 50.0000 mg | ORAL_TABLET | Freq: Two times a day (BID) | ORAL | Status: DC | PRN

## 2014-09-10 NOTE — Telephone Encounter (Signed)
Last seen 02/15/14 and filled 04/16/13 #180.   Please advise     KP

## 2014-09-10 NOTE — Telephone Encounter (Signed)
Refill x1 

## 2014-09-15 ENCOUNTER — Encounter (HOSPITAL_BASED_OUTPATIENT_CLINIC_OR_DEPARTMENT_OTHER): Payer: Medicare Other | Attending: General Surgery

## 2014-09-15 DIAGNOSIS — L97309 Non-pressure chronic ulcer of unspecified ankle with unspecified severity: Secondary | ICD-10-CM | POA: Diagnosis not present

## 2014-09-15 DIAGNOSIS — L03119 Cellulitis of unspecified part of limb: Principal | ICD-10-CM

## 2014-09-15 DIAGNOSIS — L02419 Cutaneous abscess of limb, unspecified: Secondary | ICD-10-CM | POA: Diagnosis present

## 2014-09-15 NOTE — Progress Notes (Signed)
[facility]  NAME:  Kerry Myers, Kerry Myers NO.:  [id number]  MEDICAL RECORD NO.:  [id number]      DATE OF BIRTH:  [birth year]  PHYSICIAN:  Judene Companion, M.D.           VISIT DATE:                                  OFFICE VISIT   This is a 80 year old lady that I have seen many times in the past because of ulcers and cellulitis and various complaints involving her right leg.  She has had multiple procedures before including a right 3rd toe amputation.  She has a history of venous stasis of her legs and has had chronic complaints of pain.  Today, when she came to the clinic, her blood pressure is 136/66, pulse 68, temperature 97.  She only weighs 83 pounds and she is 4 feet 10 inches.  She is alert.  She does have a diagnosis of neuropathy of her legs.  Her medicines are fentanyl patch, omeprazole, calcium, vitamins, MiraLAX, stool softeners, omega-3.  She also takes tramadol and Mentax.  Today, I just examined her and she does have an ulcer that is really on the dorsal aspect of her ankle that is about 2 cm long and 2 or 3 mm wide.  I tried to debride it, but it was too painful.  So we are going to treat it with Silvadene ointment which she has at home.  I put her on doxycycline because she really has cellulitis around the whole of foot and lower leg and we got a culture. She will come back here in a week.     Judene Companion, M.D.     PP/MEDQ  D:  09/15/2014  T:  09/15/2014  Job:  [id number]

## 2014-09-22 DIAGNOSIS — L97309 Non-pressure chronic ulcer of unspecified ankle with unspecified severity: Secondary | ICD-10-CM | POA: Diagnosis not present

## 2014-09-22 DIAGNOSIS — L02419 Cutaneous abscess of limb, unspecified: Secondary | ICD-10-CM | POA: Diagnosis not present

## 2014-09-24 ENCOUNTER — Telehealth: Payer: Self-pay | Admitting: Family Medicine

## 2014-09-24 MED ORDER — FENTANYL 12 MCG/HR TD PT72: 12.5000 ug | MEDICATED_PATCH | TRANSDERMAL | Status: DC

## 2014-09-24 NOTE — Telephone Encounter (Signed)
Kerry Myers has been made aware Rx will be ready after 12 pm.     KP

## 2014-09-24 NOTE — Telephone Encounter (Signed)
Refill x1 

## 2014-09-24 NOTE — Telephone Encounter (Signed)
Caller name: [name]  Relation to pt: son Call back number: [phone] Pharmacy: son will pick up rx in office please advise  Reason for call: refill order fentaNYL (DURAGESIC - DOSED MCG/HR) 12 MCG/HR, please

## 2014-09-29 DIAGNOSIS — L02419 Cutaneous abscess of limb, unspecified: Secondary | ICD-10-CM | POA: Diagnosis not present

## 2014-09-29 DIAGNOSIS — L97309 Non-pressure chronic ulcer of unspecified ankle with unspecified severity: Secondary | ICD-10-CM | POA: Diagnosis not present

## 2014-09-29 DIAGNOSIS — L03119 Cellulitis of unspecified part of limb: Secondary | ICD-10-CM | POA: Diagnosis not present

## 2014-10-06 ENCOUNTER — Encounter (HOSPITAL_BASED_OUTPATIENT_CLINIC_OR_DEPARTMENT_OTHER): Payer: Medicare Other | Attending: General Surgery

## 2014-10-06 DIAGNOSIS — L97519 Non-pressure chronic ulcer of other part of right foot with unspecified severity: Secondary | ICD-10-CM | POA: Insufficient documentation

## 2014-10-06 DIAGNOSIS — I87331 Chronic venous hypertension (idiopathic) with ulcer and inflammation of right lower extremity: Secondary | ICD-10-CM | POA: Diagnosis present

## 2014-10-20 DIAGNOSIS — L97519 Non-pressure chronic ulcer of other part of right foot with unspecified severity: Secondary | ICD-10-CM | POA: Diagnosis not present

## 2014-10-20 DIAGNOSIS — I87331 Chronic venous hypertension (idiopathic) with ulcer and inflammation of right lower extremity: Secondary | ICD-10-CM | POA: Diagnosis not present

## 2014-10-25 ENCOUNTER — Encounter: Payer: Medicare Other | Admitting: Family Medicine

## 2014-10-27 DIAGNOSIS — L97519 Non-pressure chronic ulcer of other part of right foot with unspecified severity: Secondary | ICD-10-CM | POA: Diagnosis not present

## 2014-10-27 DIAGNOSIS — I87331 Chronic venous hypertension (idiopathic) with ulcer and inflammation of right lower extremity: Secondary | ICD-10-CM | POA: Diagnosis not present

## 2014-11-03 ENCOUNTER — Telehealth: Payer: Self-pay | Admitting: Family Medicine

## 2014-11-03 ENCOUNTER — Encounter: Payer: Medicare Other | Admitting: Medical

## 2014-11-03 ENCOUNTER — Telehealth: Payer: Self-pay | Admitting: *Deleted

## 2014-11-03 ENCOUNTER — Encounter (HOSPITAL_BASED_OUTPATIENT_CLINIC_OR_DEPARTMENT_OTHER): Payer: Medicare Other | Attending: General Surgery

## 2014-11-03 DIAGNOSIS — L97511 Non-pressure chronic ulcer of other part of right foot limited to breakdown of skin: Secondary | ICD-10-CM | POA: Insufficient documentation

## 2014-11-03 DIAGNOSIS — I87331 Chronic venous hypertension (idiopathic) with ulcer and inflammation of right lower extremity: Secondary | ICD-10-CM | POA: Insufficient documentation

## 2014-11-03 NOTE — Telephone Encounter (Signed)
Pt has enough until Tuesday when she will be back in. I have never seen her. Prefer she use what she has until back in on Tuesday.

## 2014-11-03 NOTE — Telephone Encounter (Signed)
Pt arrived 25 minutes late to appointment 11/03/2014 at 3:00pm with [name], PA.  [name] could not see her, so she rescheduled with [name] 11/09/2014 at 1:30pm.

## 2014-11-03 NOTE — Telephone Encounter (Signed)
Caller name:[name] Salomon Relation to pt:son Call back [phone] Pharmacy:  Reason for call: pt has a 3:00 appt today with [name], son would like to know if he can pick her rx for fentaNYL (DURAGESIC - DOSED MCG/HR) 12 MCG/HR up when they come for the appt. States they live [address] and he does not want to have to come all the way back to get the rx. Son states that Sybil McGinesis  (Caregiver)will be bring her to her appt today. Pt is a lowne pt.

## 2014-11-04 ENCOUNTER — Emergency Department (HOSPITAL_COMMUNITY): Payer: Medicare Other

## 2014-11-04 ENCOUNTER — Observation Stay (HOSPITAL_COMMUNITY)
Admission: EM | Admit: 2014-11-04 | Discharge: 2014-11-05 | Disposition: A | Discharge status: Home / Self Care | Payer: Medicare Other | Attending: Internal Medicine | Admitting: Internal Medicine

## 2014-11-04 ENCOUNTER — Encounter (HOSPITAL_COMMUNITY): Payer: Self-pay

## 2014-11-04 DIAGNOSIS — M4180 Other forms of scoliosis, site unspecified: Secondary | ICD-10-CM

## 2014-11-04 DIAGNOSIS — R079 Chest pain, unspecified: Secondary | ICD-10-CM | POA: Diagnosis present

## 2014-11-04 DIAGNOSIS — R0602 Shortness of breath: Secondary | ICD-10-CM | POA: Diagnosis not present

## 2014-11-04 DIAGNOSIS — K449 Diaphragmatic hernia without obstruction or gangrene: Secondary | ICD-10-CM

## 2014-11-04 DIAGNOSIS — M419 Scoliosis, unspecified: Secondary | ICD-10-CM | POA: Insufficient documentation

## 2014-11-04 DIAGNOSIS — I447 Left bundle-branch block, unspecified: Secondary | ICD-10-CM | POA: Insufficient documentation

## 2014-11-04 DIAGNOSIS — R072 Precordial pain: Principal | ICD-10-CM | POA: Insufficient documentation

## 2014-11-04 DIAGNOSIS — L97519 Non-pressure chronic ulcer of other part of right foot with unspecified severity: Secondary | ICD-10-CM | POA: Insufficient documentation

## 2014-11-04 DIAGNOSIS — Z79899 Other long term (current) drug therapy: Secondary | ICD-10-CM | POA: Insufficient documentation

## 2014-11-04 DIAGNOSIS — G2581 Restless legs syndrome: Secondary | ICD-10-CM | POA: Diagnosis not present

## 2014-11-04 DIAGNOSIS — K219 Gastro-esophageal reflux disease without esophagitis: Secondary | ICD-10-CM | POA: Diagnosis present

## 2014-11-04 DIAGNOSIS — F0390 Unspecified dementia without behavioral disturbance: Secondary | ICD-10-CM | POA: Diagnosis not present

## 2014-11-04 DIAGNOSIS — M4120 Other idiopathic scoliosis, site unspecified: Secondary | ICD-10-CM | POA: Diagnosis present

## 2014-11-04 LAB — CBC
HEMATOCRIT: 38 % (ref 36.0–46.0)
HEMATOCRIT: 38.2 % (ref 36.0–46.0)
HEMOGLOBIN: 12.8 g/dL (ref 12.0–15.0)
Hemoglobin: 12.7 g/dL (ref 12.0–15.0)
MCH: 30.8 pg (ref 26.0–34.0)
MCH: 30.8 pg (ref 26.0–34.0)
MCHC: 33.4 g/dL (ref 30.0–36.0)
MCHC: 33.5 g/dL (ref 30.0–36.0)
MCV: 92 fL (ref 78.0–100.0)
MCV: 92 fL (ref 78.0–100.0)
PLATELETS: 193 10*3/uL (ref 150–400)
Platelets: 205 10*3/uL (ref 150–400)
RBC: 4.13 MIL/uL (ref 3.87–5.11)
RBC: 4.15 MIL/uL (ref 3.87–5.11)
RDW: 13.5 % (ref 11.5–15.5)
RDW: 13.6 % (ref 11.5–15.5)
WBC: 6.7 10*3/uL (ref 4.0–10.5)
WBC: 5.4 10*3/uL (ref 4.0–10.5)

## 2014-11-04 LAB — BASIC METABOLIC PANEL
Anion gap: 11 (ref 5–15)
BUN: 22 mg/dL (ref 6–23)
CALCIUM: 9.5 mg/dL (ref 8.4–10.5)
CHLORIDE: 102 meq/L (ref 96–112)
CO2: 30 meq/L (ref 19–32)
CREATININE: 0.55 mg/dL (ref 0.50–1.10)
GFR calc Af Amer: >90 mL/min (ref >90)
GFR calc non Af Amer: 78 mL/min — ABNORMAL LOW (ref >90)
Glucose, Bld: 111 mg/dL — ABNORMAL HIGH (ref 70–99)
Potassium: 3.8 mEq/L (ref 3.7–5.3)
Sodium: 143 mEq/L (ref 137–147)

## 2014-11-04 LAB — CREATININE, SERUM
Creatinine, Ser: 0.52 mg/dL (ref 0.50–1.10)
GFR calc non Af Amer: 80 mL/min — ABNORMAL LOW (ref >90)

## 2014-11-04 LAB — I-STAT TROPONIN, ED: Troponin i, poc: 0 ng/mL (ref 0.00–0.08)

## 2014-11-04 LAB — D-DIMER, QUANTITATIVE (NOT AT ARMC): D-Dimer, Quant: 1.37 ug/mL-FEU — ABNORMAL HIGH (ref 0.00–0.48)

## 2014-11-04 LAB — TROPONIN I: Troponin I: <0.3 ng/mL (ref <0.30)

## 2014-11-04 MED ORDER — SODIUM CHLORIDE 0.9 % IJ SOLN: 3.0000 mL | INTRAMUSCULAR | Status: DC | PRN

## 2014-11-04 MED ORDER — ONDANSETRON HCL 4 MG/2ML IJ SOLN: 4.0000 mg | Freq: Four times a day (QID) | INTRAMUSCULAR | Status: DC | PRN

## 2014-11-04 MED ORDER — ASPIRIN 81 MG PO CHEW
324.0000 mg | CHEWABLE_TABLET | Freq: Once | ORAL | Status: AC
  Administered 2014-11-04: 324 mg via ORAL
  Filled 2014-11-04: qty 4

## 2014-11-04 MED ORDER — ACETAMINOPHEN 650 MG RE SUPP: 650.0000 mg | Freq: Four times a day (QID) | RECTAL | Status: DC | PRN

## 2014-11-04 MED ORDER — FENTANYL 12 MCG/HR TD PT72
12.5000 ug | MEDICATED_PATCH | TRANSDERMAL | Status: DC
Start: 2014-11-07 — End: 2014-11-05

## 2014-11-04 MED ORDER — ENOXAPARIN SODIUM 30 MG/0.3ML ~~LOC~~ SOLN
30.0000 mg | SUBCUTANEOUS | Status: DC
  Administered 2014-11-04: 30 mg via SUBCUTANEOUS
  Filled 2014-11-04 (×2): qty 0.3

## 2014-11-04 MED ORDER — PANTOPRAZOLE SODIUM 40 MG PO TBEC
40.0000 mg | DELAYED_RELEASE_TABLET | Freq: Every day | ORAL | Status: DC
  Administered 2014-11-04 – 2014-11-05 (×2): 40 mg via ORAL
  Filled 2014-11-04 (×2): qty 1

## 2014-11-04 MED ORDER — IPRATROPIUM BROMIDE 0.02 % IN SOLN
0.5000 mg | Freq: Four times a day (QID) | RESPIRATORY_TRACT | Status: DC
  Administered 2014-11-04: 0.5 mg via RESPIRATORY_TRACT
  Filled 2014-11-04: qty 2.5

## 2014-11-04 MED ORDER — METOPROLOL TARTRATE 12.5 MG HALF TABLET
12.5000 mg | ORAL_TABLET | Freq: Two times a day (BID) | ORAL | Status: DC
  Administered 2014-11-04 – 2014-11-05 (×2): 12.5 mg via ORAL
  Filled 2014-11-04 (×3): qty 1

## 2014-11-04 MED ORDER — IOHEXOL 350 MG/ML SOLN
50.0000 mL | Freq: Once | INTRAVENOUS | Status: AC | PRN
  Administered 2014-11-04: 50 mL via INTRAVENOUS

## 2014-11-04 MED ORDER — SODIUM CHLORIDE 0.9 % IJ SOLN: 3.0000 mL | Freq: Two times a day (BID) | INTRAMUSCULAR | Status: DC

## 2014-11-04 MED ORDER — MORPHINE SULFATE 2 MG/ML IJ SOLN: 2.0000 mg | INTRAMUSCULAR | Status: DC | PRN

## 2014-11-04 MED ORDER — HYDROCODONE-ACETAMINOPHEN 5-325 MG PO TABS: 1.0000 | ORAL_TABLET | ORAL | Status: DC | PRN

## 2014-11-04 MED ORDER — OXYBUTYNIN CHLORIDE ER 10 MG PO TB24
10.0000 mg | ORAL_TABLET | Freq: Every day | ORAL | Status: DC
  Administered 2014-11-05: 10 mg via ORAL
  Filled 2014-11-04: qty 1

## 2014-11-04 MED ORDER — IPRATROPIUM-ALBUTEROL 0.5-2.5 (3) MG/3ML IN SOLN
3.0000 mL | Freq: Three times a day (TID) | RESPIRATORY_TRACT | Status: DC
  Administered 2014-11-05 (×2): 3 mL via RESPIRATORY_TRACT
  Filled 2014-11-04 (×2): qty 3

## 2014-11-04 MED ORDER — ACETAMINOPHEN 325 MG PO TABS: 650.0000 mg | ORAL_TABLET | Freq: Four times a day (QID) | ORAL | Status: DC | PRN

## 2014-11-04 MED ORDER — ONDANSETRON HCL 4 MG PO TABS
4.0000 mg | ORAL_TABLET | Freq: Four times a day (QID) | ORAL | Status: DC | PRN
Start: 2014-11-04 — End: 2014-11-05

## 2014-11-04 MED ORDER — SODIUM CHLORIDE 0.9 % IJ SOLN
3.0000 mL | Freq: Two times a day (BID) | INTRAMUSCULAR | Status: DC
  Administered 2014-11-04 – 2014-11-05 (×2): 3 mL via INTRAVENOUS

## 2014-11-04 MED ORDER — ALBUTEROL SULFATE (2.5 MG/3ML) 0.083% IN NEBU
2.5000 mg | INHALATION_SOLUTION | Freq: Four times a day (QID) | RESPIRATORY_TRACT | Status: DC
  Administered 2014-11-04: 2.5 mg via RESPIRATORY_TRACT
  Filled 2014-11-04: qty 3

## 2014-11-04 MED ORDER — TRAMADOL HCL 50 MG PO TABS
50.0000 mg | ORAL_TABLET | Freq: Four times a day (QID) | ORAL | Status: DC | PRN
  Administered 2014-11-05: 50 mg via ORAL
  Filled 2014-11-04: qty 1

## 2014-11-04 MED ORDER — GABAPENTIN 100 MG PO CAPS
100.0000 mg | ORAL_CAPSULE | Freq: Every day | ORAL | Status: DC
Start: 2014-11-04 — End: 2014-11-05
  Administered 2014-11-04: 100 mg via ORAL
  Filled 2014-11-04 (×2): qty 1

## 2014-11-04 MED ORDER — SODIUM CHLORIDE 0.9 % IV SOLN: 250.0000 mL | INTRAVENOUS | Status: DC | PRN

## 2014-11-04 MED ORDER — ASPIRIN EC 81 MG PO TBEC
81.0000 mg | DELAYED_RELEASE_TABLET | Freq: Every day | ORAL | Status: DC
  Administered 2014-11-04 – 2014-11-05 (×2): 81 mg via ORAL
  Filled 2014-11-04 (×2): qty 1

## 2014-11-04 MED ORDER — ALBUTEROL SULFATE (2.5 MG/3ML) 0.083% IN NEBU: 2.5000 mg | INHALATION_SOLUTION | RESPIRATORY_TRACT | Status: DC | PRN

## 2014-11-04 MED ORDER — PRAMIPEXOLE DIHYDROCHLORIDE 0.25 MG PO TABS
0.5000 mg | ORAL_TABLET | Freq: Every day | ORAL | Status: DC
Start: 2014-11-04 — End: 2014-11-05
  Administered 2014-11-04: 0.5 mg via ORAL
  Filled 2014-11-04 (×2): qty 2

## 2014-11-04 MED ORDER — POLYETHYLENE GLYCOL 3350 17 GM/SCOOP PO POWD
17.0000 g | Freq: Every morning | ORAL | Status: DC
  Filled 2014-11-04 (×2): qty 255

## 2014-11-04 MED ORDER — ZOLPIDEM TARTRATE 5 MG PO TABS: 5.0000 mg | ORAL_TABLET | Freq: Every evening | ORAL | Status: DC | PRN

## 2014-11-04 NOTE — H&P (Addendum)
[facility]                                                                                    Patient Demographics  Kerry Myers, is a 80 y.o. female  CSN: [id number]  MRN: [id number]  DOB - [birth year]  Admit Date - 11/04/2014  Outpatient Primary MD for the patient is Garnet Koyanagi, DO   With History of -  Past Medical History  Diagnosis Date  . GERD (gastroesophageal reflux disease)   . Bladder incontinence   . Neuropathic pain   . RLS (restless legs syndrome)   . Arthritis   . LBBB (left bundle branch block)       Past Surgical History  Procedure Laterality Date  . Tonsillectomy    . Abdominal hysterectomy      TVH  . Incontinence surgery    . Excision morton's neuroma    . Eye surgery      cataracts  . Amputation  04/30/2012    Procedure: AMPUTATION DIGIT;  Surgeon: Colin Rhein, MD;  Location: [facility];  Service: Orthopedics;  Laterality: Right;  right 2nd toe amptation through MTP joint    in for   Chief Complaint  Patient presents with  . Chest Pain     HPI  Kerry Myers  is a 81 y.o. female, with past medical history significant for levoscoliosis, GERD and restless leg syndrome presenting today with an episode of epigastric/substernal chest discomfort that lasted for minutes associated with mild shortness of breath but no nausea or cold sweats. In the emergency room the patient was chest pain-free No Headache dizziness or loss of consciousness .the patient was confused and could not give any significant history and according to the family , she suffers from mild dementia Patient complains of right foot pain where there is a chronic ulcer being followed at the wound care clinic .    Review of Systems    In addition to the HPI above,  No Fever-chills, No Headache, No changes with Vision  No problems swallowing food or Liquids, Very hard of hearing No Abdominal pain, No Nausea or Vommitting, Bowel movements are  regular, No Blood in stool or Urine, No dysuria, No new skin rashes or bruises, No new joints pains-aches,  No new weakness, tingling, numbness in any extremity, No recent weight gain or loss, No polyuria, polydypsia or polyphagia, No significant Mental Stressors.  A full 10 point Review of Systems was done, except as stated above, all other Review of Systems were negative.   Social History History  Substance Use Topics  . Smoking status: Never Smoker   . Smokeless tobacco: Never Used  . Alcohol Use: No     Family History Patient cannot remember any.  Prior to Admission medications   Medication Sig Start Date End Date Taking? Authorizing Provider  albuterol (PROVENTIL HFA;VENTOLIN HFA) 108 (90 BASE) MCG/ACT inhaler Inhale 2 puffs into the lungs every 6 (six) hours as needed for wheezing. 03/27/13   Janece Canterbury, MD  Ascorbic Acid (VITAMIN C) 1000 MG tablet Take 1,000 mg by mouth daily.    Historical Provider, MD  calcium carbonate (  OS-CAL) 600 MG TABS Take 600 mg by mouth daily.    Historical Provider, MD  Cholecalciferol (VITAMIN D3) 5000 UNITS CAPS Take 1 capsule by mouth daily.    Historical Provider, MD  Docusate Calcium (STOOL SOFTENER PO) Take 2 tablets by mouth 2 (two) times daily.    Historical Provider, MD  fentaNYL (DURAGESIC - DOSED MCG/HR) 12 MCG/HR Place 1 patch (12.5 mcg total) onto the skin every 3 (three) days. 09/24/14   Rosalita Chessman, DO  gabapentin (NEURONTIN) 100 MG capsule Take 2 capsules by mouth in the morning , 2 at [facility] and  1 at bedtime 09/09/14   Alferd Apa [name], DO  ipratropium-albuterol (DUONEB) 0.5-2.5 (3) MG/3ML SOLN Take 3 mLs by nebulization every 6 (six) hours as needed. 03/17/14   Brand Males, MD  L-Methylfolate-B6-B12 Lebron Quam) 3-35-2 MG TABS Take 1 tablet by mouth two  times daily 09/09/14   Rosalita Chessman, DO  Multiple Vitamin (MULTIVITAMIN WITH MINERALS) TABS Take 1 tablet by mouth daily.    Historical Provider, MD  Omega-3 Fatty Acids  (FISH OIL) 1000 MG CPDR Take 1,000 mg by mouth daily.    Historical Provider, MD  omeprazole (PRILOSEC) 40 MG capsule Take 1 capsule (40 mg total) by mouth at bedtime. 08/24/14   Rosalita Chessman, DO  oxybutynin (DITROPAN-XL) 10 MG 24 hr tablet Take 1 tablet (10 mg total) by mouth daily. 05/26/14   Rosalita Chessman, DO  polyethylene glycol powder (GLYCOLAX/MIRALAX) powder Take 17 g by mouth every morning.     Historical Provider, MD  pramipexole (MIRAPEX) 0.5 MG tablet Take 1 tablet (0.5 mg  total) by mouth at bedtime. 08/24/14   Rosalita Chessman, DO  traMADol (ULTRAM) 50 MG tablet Take 1 tablet (50 mg total) by mouth 2 (two) times daily as needed. 09/10/14   Rosalita Chessman, DO    No Known Allergies  Physical Exam  Vitals  Blood pressure 140/55, pulse 58, resp. rate 19, SpO2 100 %.   1. General elderly female well-developed and well-nourished very pleasant in no significant distress  2. Normal affect and insight, Not Suicidal or Homicidal, pleasantly confused.  3. No F.N deficits grossly, .  4. Ears and Eyes appear Normal, Conjunctivae clear, PERRLA. Moist Oral Mucosa.  5. Supple Neck, right-sidedJVD,   6. asymmetrical Chest wall movement, decreased breath sounds.  7. RRR, No Gallops, Rubs or Murmurs, No Parasternal Heave.  8. Positive Bowel Sounds, Abdomen Soft, Non tender, No organomegaly appriciated,No rebound -guarding or rigidity.  9.  No Cyanosis, Normal Skin Turgor, No Skin Rash or Bruise.  10. Good muscle tone,  joints appear normal , no effusions, Normal ROM.  11. No Palpable Lymph Nodes in Neck or Axillae    Data Review  CBC  Recent Labs Lab 11/04/14 1305  WBC 6.7  HGB 12.7  HCT 38.0  PLT 193  MCV 92.0  MCH 30.8  MCHC 33.4  RDW 13.5   ------------------------------------------------------------------------------------------------------------------  Chemistries   Recent Labs Lab 11/04/14 1434  NA 143  K 3.8  CL 102  CO2 30  GLUCOSE 111*  BUN 22   CREATININE 0.55  CALCIUM 9.5   ------------------------------------------------------------------------------------------------------------------ CrCl cannot be calculated (Unknown ideal weight.). ------------------------------------------------------------------------------------------------------------------ No results for input(s): TSH, T4TOTAL, T3FREE, THYROIDAB in the last 72 hours.  Invalid input(s): FREET3   Coagulation profile No results for input(s): INR, PROTIME in the last 168 hours. -------------------------------------------------------------------------------------------------------------------  Recent Labs  11/04/14 1305  DDIMER 1.37*   -------------------------------------------------------------------------------------------------------------------  Cardiac  Enzymes No results for input(s): CKMB, TROPONINI, MYOGLOBIN in the last 168 hours.  Invalid input(s): CK ------------------------------------------------------------------------------------------------------------------ Invalid input(s): POCBNP   ---------------------------------------------------------------------------------------------------------------  Urinalysis    Component Value Date/Time   COLORURINE YELLOW 12/03/2013 1706   APPEARANCEUR CLEAR 12/03/2013 1706   LABSPEC 1.023 12/03/2013 1706   PHURINE 7.5 12/03/2013 1706   GLUCOSEU NEGATIVE 12/03/2013 1706   HGBUR NEGATIVE 12/03/2013 1706   BILIRUBINUR NEGATIVE 12/03/2013 1706   BILIRUBINUR Neg 04/02/2013 1401   KETONESUR NEGATIVE 12/03/2013 1706   PROTEINUR NEGATIVE 12/03/2013 1706   PROTEINUR Neg 04/02/2013 1401   UROBILINOGEN 0.2 12/03/2013 1706   UROBILINOGEN 0.2 04/02/2013 1401   NITRITE NEGATIVE 12/03/2013 1706   NITRITE Neg 04/02/2013 1401   LEUKOCYTESUR SMALL* 12/03/2013 1706    ----------------------------------------------------------------------------------------------------------------     Imaging results:   Ct  Angio Chest Pe W/cm &/or Wo Cm  11/04/2014   CLINICAL DATA:  Acute onset of non radiating central chest pain. Other chest pain R07.89 (ICD-10-CM)  EXAM: CT ANGIOGRAPHY CHEST WITH CONTRAST  TECHNIQUE: Multidetector CT imaging of the chest was performed using the standard protocol during bolus administration of intravenous contrast. Multiplanar CT image reconstructions and MIPs were obtained to evaluate the vascular anatomy.  CONTRAST:  49mL OMNIPAQUE IOHEXOL 350 MG/ML SOLN  COMPARISON:  03/23/2013  FINDINGS: Negative for pulmonary embolism. There is a moderate-sized hiatal hernia. Small amount of left pleural fluid. No significant pericardial fluid. The no evidence for chest lymphadenopathy. Ascending thoracic aorta measures 3.3 cm without dissection. The great vessels are patent. There are coronary artery calcifications.  Imaging of the upper abdomen is limited without gross abnormality. The high-density material in the kidneys probably related to contrast but cannot exclude renal calcifications.  The trachea and mainstem bronchi are patent. Stable focal thickening at the lingula on sequence 506, image 66. This has minimally changed from the previous CT and probably represents scarring. There is no evidence for airspace disease. There is a noncalcified nodule in the left upper lobe on sequence 506, image 48 which measures 4 mm. This nodule is essentially unchanged. There is nodule is likely benign based on size and stability. There is a stable subtle nodular density in the left upper lobe on image 16. Extensive remodeling in the right shoulder joint consistent with degenerative disease. Severe scoliosis in the thoracolumbar spine.  Review of the MIP images confirms the above findings.  IMPRESSION: Negative for pulmonary embolism.  Trace amount of left pleural fluid. Otherwise, no acute chest findings.  Stable small left pulmonary nodules as described.  Hiatal hernia.   Electronically Signed   By: Markus Daft M.D.    On: 11/04/2014 17:42   Dg Chest Port 1 View  11/04/2014   CLINICAL DATA:  Chest pain and shortness of breath.  EXAM: PORTABLE CHEST - 1 VIEW  COMPARISON:  12/15/2013  FINDINGS: Cardiac silhouette is mildly enlarged. Density with air-fluid level projecting over the lower mediastinum corresponds to previously seen hiatal hernia and appears smaller than on the prior study. The lungs are hypoinflated without evidence of airspace consolidation, edema, pleural effusion, or pneumothorax. Thoracic levoscoliosis is noted. Degenerative changes are noted at the right glenohumeral joint.  IMPRESSION: Shallow inspiration without evidence of acute air space disease.   Electronically Signed   By: Logan Bores   On: 11/04/2014 13:16    My personal review of EKG: Rhythm NSR, @ 80 B/M with LVH and  NS ST Changes    A/P  1. Chest painwith elevated d-dimer's and  negative CT of the chest for PE 2. history of levoscoliosiswith decreased chest elasticity will continue with nebulizer treatments 3. Restless leg syndrome 4.right foot ulcer/chronic  Plan  Place in telemetry for observation Right foot wound care Serial troponins Discharge home in a.m. If stable   DVT Prophylaxis Lovenox  AM Labs Ordered, also please review Full Orders  Family Communication: Admission, patients condition and plan of care including tests being ordered have been discussed with the patient and son who indicate understanding and agree with the plan and Code Status.  Code Status Full  Disposition Plan: home with home health  Time spent in minutes : 32 minutes  Condition GUARDED   @SIGNATURE @

## 2014-11-04 NOTE — ED Notes (Signed)
Attempted to call report to floor. Nurse unavailable at this time. 

## 2014-11-04 NOTE — ED Notes (Signed)
Pt reports she began to have epigastric/ midsternal chest pain beginning around 1100 this morning.  Pt is unable to describe the pain but denies any n/v/d, back pain or diaphoresis.  Pt does report some SHOB.

## 2014-11-04 NOTE — ED Notes (Signed)
EDMD at bedside

## 2014-11-04 NOTE — ED Provider Notes (Signed)
CSN: [id number]     Arrival date & time 11/04/14  1206 History   First MD Initiated Contact with Patient 11/04/14 1210     Chief Complaint  Patient presents with  . Chest Pain     (Consider location/radiation/quality/duration/timing/severity/associated sxs/prior Treatment) HPI patient is poor historian Patient developed anterior nonradiatingchest pain onset approximately 8 AM today which resolved spontaneously, without treatment. Associated symptomsinclude shortness of breath. No treatment prior to coming here presently she denies chest pain admits to mild shortness of breath no fever no cough. Nothing made pain better or worse. Past Medical History  Diagnosis Date  . GERD (gastroesophageal reflux disease)   . Bladder incontinence   . Neuropathic pain   . RLS (restless legs syndrome)   . Arthritis   . LBBB (left bundle branch block)    Past Surgical History  Procedure Laterality Date  . Tonsillectomy    . Abdominal hysterectomy      TVH  . Incontinence surgery    . Excision morton's neuroma    . Eye surgery      cataracts  . Amputation  04/30/2012    Procedure: AMPUTATION DIGIT;  Surgeon: Colin Rhein, MD;  Location: [facility];  Service: Orthopedics;  Laterality: Right;  right 2nd toe amptation through MTP joint   History reviewed. No pertinent family history. History  Substance Use Topics  . Smoking status: Never Smoker   . Smokeless tobacco: Never Used  . Alcohol Use: No   OB History    No data available     Review of Systems  Respiratory: Positive for shortness of breath.   Cardiovascular: Positive for chest pain.  All other systems reviewed and are negative.     Allergies  Review of patient's allergies indicates no known allergies.  Home Medications   Prior to Admission medications   Medication Sig Start Date End Date Taking? Authorizing Provider  albuterol (PROVENTIL HFA;VENTOLIN HFA) 108 (90 BASE) MCG/ACT inhaler Inhale 2 puffs into the  lungs every 6 (six) hours as needed for wheezing. 03/27/13   Janece Canterbury, MD  Ascorbic Acid (VITAMIN C) 1000 MG tablet Take 1,000 mg by mouth daily.    Historical Provider, MD  calcium carbonate (OS-CAL) 600 MG TABS Take 600 mg by mouth daily.    Historical Provider, MD  Cholecalciferol (VITAMIN D3) 5000 UNITS CAPS Take 1 capsule by mouth daily.    Historical Provider, MD  Docusate Calcium (STOOL SOFTENER PO) Take 2 tablets by mouth 2 (two) times daily.    Historical Provider, MD  fentaNYL (DURAGESIC - DOSED MCG/HR) 12 MCG/HR Place 1 patch (12.5 mcg total) onto the skin every 3 (three) days. 09/24/14   Rosalita Chessman, DO  gabapentin (NEURONTIN) 100 MG capsule Take 2 capsules by mouth in the morning , 2 at [facility] and  1 at bedtime 09/09/14   Alferd Apa [name], DO  ipratropium-albuterol (DUONEB) 0.5-2.5 (3) MG/3ML SOLN Take 3 mLs by nebulization every 6 (six) hours as needed. 03/17/14   Brand Males, MD  L-Methylfolate-B6-B12 Lebron Quam) 3-35-2 MG TABS Take 1 tablet by mouth two  times daily 09/09/14   Rosalita Chessman, DO  Multiple Vitamin (MULTIVITAMIN WITH MINERALS) TABS Take 1 tablet by mouth daily.    Historical Provider, MD  Omega-3 Fatty Acids (FISH OIL) 1000 MG CPDR Take 1,000 mg by mouth daily.    Historical Provider, MD  omeprazole (PRILOSEC) 40 MG capsule Take 1 capsule (40 mg total) by mouth at bedtime. 08/24/14  Alferd Apa [name], DO  oxybutynin (DITROPAN-XL) 10 MG 24 hr tablet Take 1 tablet (10 mg total) by mouth daily. 05/26/14   Rosalita Chessman, DO  polyethylene glycol powder (GLYCOLAX/MIRALAX) powder Take 17 g by mouth every morning.     Historical Provider, MD  pramipexole (MIRAPEX) 0.5 MG tablet Take 1 tablet (0.5 mg  total) by mouth at bedtime. 08/24/14   Rosalita Chessman, DO  traMADol (ULTRAM) 50 MG tablet Take 1 tablet (50 mg total) by mouth 2 (two) times daily as needed. 09/10/14   Yvonne R [name], DO   BP 158/66 mmHg  Pulse 79  Resp 26  SpO2 100% Physical Exam  Constitutional:   Chronically ill-appearing cachectic  HENT:  Head: Normocephalic and atraumatic.  Eyes: Conjunctivae are normal. Pupils are equal, round, and reactive to light.  Neck: Neck supple. No tracheal deviation present. No thyromegaly present.  Cardiovascular: Normal rate and regular rhythm.   No murmur heard. Pulmonary/Chest: Effort normal and breath sounds normal.  Thoracic kyphosis and no chest tenderness  Abdominal: Soft. Bowel sounds are normal. She exhibits no distension. There is no tenderness.  Musculoskeletal: Normal range of motion. She exhibits no edema or tenderness.  Neurological: She is alert. No cranial nerve deficit. Coordination normal.  Skin: Skin is warm and dry. No rash noted.  Psychiatric: She has a normal mood and affect.  Nursing note and vitals reviewed.   ED Course  Procedures (including critical care time) Labs Review Labs Reviewed  CBC  D-DIMER, QUANTITATIVE  I-STAT TROPOININ, ED  I-STAT TROPOININ, ED    Imaging Review No results found.   EKG Interpretation   Date/Time:  Thursday November 04 2014 12:19:36 EST Ventricular Rate:  80 PR Interval:  201 QRS Duration: 115 QT Interval:  409 QTC Calculation: 472 R Axis:   -72 Text Interpretation:  Sinus rhythm Left anterior fascicular block LVH with  secondary repolarization abnormality Anterior infarct, old ST elevation  suggests acute pericarditis T wave inversion in V6 from previous tracing  has resolved. Tracing is identical to 03/07/2013 Confirmed by Winfred Leeds   MD, Sapna Padron [phone]) on 11/04/2014 12:46:53 PM     Results for orders placed or performed during the hospital encounter of 11/04/14  CBC  Result Value Ref Range   WBC 6.7 4.0 - 10.5 K/uL   RBC 4.13 3.87 - 5.11 MIL/uL   Hemoglobin 12.7 12.0 - 15.0 g/dL   HCT 38.0 36.0 - 46.0 %   MCV 92.0 78.0 - 100.0 fL   MCH 30.8 26.0 - 34.0 pg   MCHC 33.4 30.0 - 36.0 g/dL   RDW 13.5 11.5 - 15.5 %   Platelets 193 150 - 400 K/uL  D-dimer, quantitative   Result Value Ref Range   D-Dimer, Quant 1.37 (H) 0.00 - 0.48 ug/mL-FEU  Basic metabolic panel  Result Value Ref Range   Sodium 143 137 - 147 mEq/L   Potassium 3.8 3.7 - 5.3 mEq/L   Chloride 102 96 - 112 mEq/L   CO2 30 19 - 32 mEq/L   Glucose, Bld 111 (H) 70 - 99 mg/dL   BUN 22 6 - 23 mg/dL   Creatinine, Ser 0.55 0.50 - 1.10 mg/dL   Calcium 9.5 8.4 - 10.5 mg/dL   GFR calc non Af Amer 78 (L) >90 mL/min   GFR calc Af Amer >90 >90 mL/min   Anion gap 11 5 - 15  I-stat troponin, ED (not at [facility])  Result Value Ref Range   Troponin i,  poc 0.00 0.00 - 0.08 ng/mL   Comment 3          chest xray viewed by me Dg Chest Port 1 View  11/04/2014   CLINICAL DATA:  Chest pain and shortness of breath.  EXAM: PORTABLE CHEST - 1 VIEW  COMPARISON:  12/15/2013  FINDINGS: Cardiac silhouette is mildly enlarged. Density with air-fluid level projecting over the lower mediastinum corresponds to previously seen hiatal hernia and appears smaller than on the prior study. The lungs are hypoinflated without evidence of airspace consolidation, edema, pleural effusion, or pneumothorax. Thoracic levoscoliosis is noted. Degenerative changes are noted at the right glenohumeral joint.  IMPRESSION: Shallow inspiration without evidence of acute air space disease.   Electronically Signed   By: Logan Bores   On: 11/04/2014 13:16   435 pm pain free . Continues to c/o dyspnea MDM  Pt signed out to Dr. Rogene Houston 435 pm , anticipate need for inpt stay Dx Chest pain Final diagnoses:  None        Orlie Dakin, MD 11/04/14 1642

## 2014-11-04 NOTE — ED Notes (Signed)
Dorsal right foot wound redressed with gauze and kling.

## 2014-11-05 DIAGNOSIS — G2581 Restless legs syndrome: Secondary | ICD-10-CM | POA: Diagnosis not present

## 2014-11-05 DIAGNOSIS — I447 Left bundle-branch block, unspecified: Secondary | ICD-10-CM | POA: Diagnosis not present

## 2014-11-05 DIAGNOSIS — R072 Precordial pain: Secondary | ICD-10-CM | POA: Diagnosis not present

## 2014-11-05 DIAGNOSIS — M4120 Other idiopathic scoliosis, site unspecified: Secondary | ICD-10-CM

## 2014-11-05 DIAGNOSIS — R0602 Shortness of breath: Secondary | ICD-10-CM | POA: Diagnosis not present

## 2014-11-05 DIAGNOSIS — K219 Gastro-esophageal reflux disease without esophagitis: Secondary | ICD-10-CM

## 2014-11-05 LAB — TROPONIN I

## 2014-11-05 MED ORDER — POLYETHYLENE GLYCOL 3350 17 G PO PACK
17.0000 g | PACK | Freq: Every day | ORAL | Status: DC
  Filled 2014-11-05: qty 1

## 2014-11-05 MED ORDER — ENSURE COMPLETE PO LIQD: 237.0000 mL | Freq: Two times a day (BID) | ORAL | Status: DC

## 2014-11-05 NOTE — Plan of Care (Signed)
Problem: Phase I Progression Outcomes Goal: Hemodynamically stable Outcome: Completed/Met Date Met:  11/05/14 Goal: Anginal pain relieved Outcome: Completed/Met Date Met:  11/05/14 Goal: Aspirin unless contraindicated Outcome: Completed/Met Date Met:  11/05/14 Goal: MD aware of Cardiac Marker results Outcome: Completed/Met Date Met:  11/05/14 Goal: Voiding-avoid urinary catheter unless indicated Outcome: Completed/Met Date Met:  11/05/14

## 2014-11-05 NOTE — Plan of Care (Signed)
Problem: Phase II Progression Outcomes Goal: Hemodynamically stable Outcome: Adequate for Discharge Goal: Anginal pain relieved Outcome: Not Progressing Goal: Stress Test if indicated Outcome: Not Applicable Date Met:  86/76/72 Goal: Cath/PCI Day Path if indicated Outcome: Not Applicable Date Met:  09/47/09

## 2014-11-05 NOTE — Progress Notes (Signed)
INITIAL NUTRITION ASSESSMENT  DOCUMENTATION CODES Per approved criteria  -Underweight   INTERVENTION: Provide Ensure Complete BID, each supplement provides 350 kcal and 13 grams of protein Recommend Ensure Plus or Boost Compact post-discharge Encourage PO intake   NUTRITION DIAGNOSIS: Inadequate oral intake related to early satiety  as evidenced by <25% meal completion,  underweight BMI and severe muscle wasting.   Goal: Pt to meet >/= 90% of their estimated nutrition needs   Monitor:  PO intake, supplement acceptance, weight trend, labs  Reason for Assessment: Consult, poor PO intake  80 y.o. female  Admitting Dx: <principal problem not specified>  ASSESSMENT: 80 y.o. female, with past medical history significant for levoscoliosis, GERD and restless leg syndrome presenting today with an episode of epigastric/substernal chest discomfort that lasted for minutes associated with mild shortness of breath but no nausea or cold sweats. In the emergency room the patient was chest pain-free No Headache dizziness or loss of consciousness .the patient was confused and could not give any significant history and according to the family , she suffers from mild dementia. Patient complains of right foot pain where there is a chronic ulcer being followed at the wound care clinic .  Lunch tray at bedside at time of visit. Pt ate some dessert and a couple bites of potatoes. Pt states that she got too full and couldn't eat more but, he eats better at home. Pt reports eating 3 meals as well as in between meals. Per family pt usually eats toast with peanut butter and jelly, coffee and cream, and sometimes sausage for breakfast, an apple for snack, a small lunch, and half a burger for dinner. She snacks on cookies and donuts as well. Pt reports that she used to drink nutritional supplements regularly but, doesn't any more. RD encouraged pt to continue snacking between meal. Encouraged intake of 1-2  nutritional supplements, like Ensure Plus, daily.  Pt declined physical exam at this time. Obvious severe wasting noted in hands and moderate wasting in temporal region.   Height: Ht Readings from Last 1 Encounters:  11/04/14 4\' 10"  (1.473 m)    Weight: Wt Readings from Last 1 Encounters:  11/05/14 77 lb 2.6 oz (35 kg)    Ideal Body Weight: 97 lbs  % Ideal Body Weight: 79%  Wt Readings from Last 10 Encounters:  11/05/14 77 lb 2.6 oz (35 kg)  04/26/14 85 lb (38.556 kg)  03/04/14 90 lb 3.2 oz (40.914 kg)  02/15/14 88 lb 6.4 oz (40.098 kg)  12/08/13 83 lb 3.2 oz (37.739 kg)  12/05/13 81 lb 9.1 oz (37 kg)  12/03/13 84 lb 12 oz (38.442 kg)  04/02/13 83 lb 3.2 oz (37.739 kg)  03/23/13 81 lb (36.741 kg)  03/23/13 86 lb (39.009 kg)    Usual Body Weight: 85 lbs  % Usual Body Weight: 90%  BMI:  Body mass index is 16.13 kg/(m^2).  Estimated Nutritional Needs: Kcal: 1250-1400 Protein: 42-50 grams Fluid: 1.2-1.4 L/day  Skin: open laceration in left foot  Diet Order: Diet Heart  EDUCATION NEEDS: -No education needs identified at this time   Intake/Output Summary (Last 24 hours) at 11/05/14 1332 Last data filed at 11/05/14 0930  Gross per 24 hour  Intake    120 ml  Output    500 ml  Net   -380 ml    Last BM: 11/4  Labs:   Recent Labs Lab 11/04/14 1434 11/04/14 2202  NA 143  --   K 3.8  --  CL 102  --   CO2 30  --   BUN 22  --   CREATININE 0.55 0.52  CALCIUM 9.5  --   GLUCOSE 111*  --     CBG (last 3)  No results for input(s): GLUCAP in the last 72 hours.  Scheduled Meds: . aspirin EC  81 mg Oral Daily  . enoxaparin (LOVENOX) injection  30 mg Subcutaneous Q24H  . [START ON 11/07/2014] fentaNYL  12.5 mcg Transdermal Q72H  . gabapentin  100 mg Oral QHS  . ipratropium-albuterol  3 mL Nebulization TID  . metoprolol tartrate  12.5 mg Oral BID  . oxybutynin  10 mg Oral Daily  . pantoprazole  40 mg Oral Daily  . polyethylene glycol  17 g Oral Daily  .  pramipexole  0.5 mg Oral QHS  . sodium chloride  3 mL Intravenous Q12H  . sodium chloride  3 mL Intravenous Q12H    Continuous Infusions:   Past Medical History  Diagnosis Date  . GERD (gastroesophageal reflux disease)   . Bladder incontinence   . Neuropathic pain   . RLS (restless legs syndrome)   . Arthritis   . LBBB (left bundle branch block)     Past Surgical History  Procedure Laterality Date  . Tonsillectomy    . Abdominal hysterectomy      TVH  . Incontinence surgery    . Excision morton's neuroma    . Eye surgery      cataracts  . Amputation  04/30/2012    Procedure: AMPUTATION DIGIT;  Surgeon: Colin Rhein, MD;  Location: [facility];  Service: Orthopedics;  Laterality: Right;  right 2nd toe amptation through MTP joint    Pryor Ochoa RD, LDN Inpatient Clinical Dietitian Pager: [phone] After Hours Pager: [phone]

## 2014-11-05 NOTE — Progress Notes (Signed)
UR completed 

## 2014-11-05 NOTE — Discharge Summary (Signed)
Physician Discharge Summary  Kerry Myers FYB:[id number] DOB: 1919-04-09 DOA: 11/04/2014  PCP: Garnet Koyanagi, DO  Admit date: 11/04/2014 Discharge date: 11/05/2014  Time spent: >45 minutes    Discharge Condition: stable Diet recommendation: heart healthy  Discharge Diagnoses:  Principal Problem:   Chest pain Active Problems:   RESTLESS LEG SYNDROME   SCOLIOSIS, THORACOLUMBAR   GERD (gastroesophageal reflux disease)   History of present illness:  Kerry Myers is a 80 y.o. female with past medical history of gastroesophageal reflux disease, restless leg syndrome who presented with a complaint of substernal chest pain and cough which started in the morning and was not associated with any other symptoms. There were no inciting factors and the patient was getting ready to have her breakfast when it started. She could not recall what made the pain go away but apparently it had resolved by the time she was seen by the admitting doctor.  Hospital Course:  Chest pain -Resolved without intervention and has not recurred   atypical for cardiac pain -admitted overnight and monitored on telemetry - 3 sets of cardiac enzymes negative -EKG shows left axis deviation and is unchanged from prior EKG from last year -is a possibility this may been related to gastroesophageal reflux disease- continue PPI  Procedures:  none  Consultations:  None  Discharge Exam: Filed Weights   11/04/14 2020 11/05/14 0601  Weight: 35.2 kg (77 lb 9.6 oz) 35 kg (77 lb 2.6 oz)   Filed Vitals:   11/05/14 0900  BP: 144/66  Pulse: 77  Temp:   Resp:     General: AAO x 3, no distress Cardiovascular: RRR, no murmurs  Respiratory: clear to auscultation bilaterally GI: soft, non-tender, non-distended, bowel sound positive  Discharge Instructions You were cared for by a hospitalist during your hospital stay. If you have any questions about your discharge medications or the care you received while you were in  the hospital after you are discharged, you can call the unit and asked to speak with the hospitalist on call if the hospitalist that took care of you is not available. Once you are discharged, your primary care physician will handle any further medical issues. Please note that NO REFILLS for any discharge medications will be authorized once you are discharged, as it is imperative that you return to your primary care physician (or establish a relationship with a primary care physician if you do not have one) for your aftercare needs so that they can reassess your need for medications and monitor your lab values.      Discharge Instructions    Diet - low sodium heart healthy    Complete by:  As directed      Increase activity slowly    Complete by:  As directed             Medication List    TAKE these medications        albuterol 108 (90 BASE) MCG/ACT inhaler  Commonly known as:  PROVENTIL HFA;VENTOLIN HFA  Inhale 2 puffs into the lungs every 6 (six) hours as needed for wheezing.     CALCIUM 600 + D PO  Take 1 tablet by mouth 2 (two) times daily.     fentaNYL 12 MCG/HR  Commonly known as:  DURAGESIC - dosed mcg/hr  Place 1 patch (12.5 mcg total) onto the skin every 3 (three) days.     Fish Oil 1000 MG Cpdr  Take 2,000 mg by mouth daily with lunch.  FOLTANX 3-35-2 MG Tabs  Take 1 tablet by mouth 2 (two) times daily. TAKES IN AM AND AT BEDTIME     gabapentin 100 MG capsule  Commonly known as:  NEURONTIN  Take 100-200 mg by mouth 3 (three) times daily. TAKES 2 CAPS IN AM, 2 CAPS MIDDAY AND 1 CAP AT BEDTIME     multivitamin with minerals Tabs tablet  Take 1 tablet by mouth every morning.     omeprazole 40 MG capsule  Commonly known as:  PRILOSEC  Take 1 capsule (40 mg total) by mouth at bedtime.     oxybutynin 10 MG 24 hr tablet  Commonly known as:  DITROPAN-XL  Take 1 tablet (10 mg total) by mouth daily.     polyethylene glycol powder powder  Commonly known as:   GLYCOLAX/MIRALAX  Take 17 g by mouth daily as needed for moderate constipation.     pramipexole 0.5 MG tablet  Commonly known as:  MIRAPEX  Take 1 tablet (0.5 mg  total) by mouth at bedtime.     STOOL SOFTENER PO  Take 2 tablets by mouth 2 (two) times daily.     traMADol 50 MG tablet  Commonly known as:  ULTRAM  Take 1 tablet (50 mg total) by mouth 2 (two) times daily as needed.     Vitamin D3 5000 UNITS Caps  Take 1 capsule by mouth daily.       No Known Allergies    The results of significant diagnostics from this hospitalization (including imaging, microbiology, ancillary and laboratory) are listed below for reference.    Significant Diagnostic Studies: Ct Angio Chest Pe W/cm &/or Wo Cm  11/04/2014   CLINICAL DATA:  Acute onset of non radiating central chest pain. Other chest pain R07.89 (ICD-10-CM)  EXAM: CT ANGIOGRAPHY CHEST WITH CONTRAST  TECHNIQUE: Multidetector CT imaging of the chest was performed using the standard protocol during bolus administration of intravenous contrast. Multiplanar CT image reconstructions and MIPs were obtained to evaluate the vascular anatomy.  CONTRAST:  35mL OMNIPAQUE IOHEXOL 350 MG/ML SOLN  COMPARISON:  03/23/2013  FINDINGS: Negative for pulmonary embolism. There is a moderate-sized hiatal hernia. Small amount of left pleural fluid. No significant pericardial fluid. The no evidence for chest lymphadenopathy. Ascending thoracic aorta measures 3.3 cm without dissection. The great vessels are patent. There are coronary artery calcifications.  Imaging of the upper abdomen is limited without gross abnormality. The high-density material in the kidneys probably related to contrast but cannot exclude renal calcifications.  The trachea and mainstem bronchi are patent. Stable focal thickening at the lingula on sequence 506, image 66. This has minimally changed from the previous CT and probably represents scarring. There is no evidence for airspace disease. There  is a noncalcified nodule in the left upper lobe on sequence 506, image 48 which measures 4 mm. This nodule is essentially unchanged. There is nodule is likely benign based on size and stability. There is a stable subtle nodular density in the left upper lobe on image 16. Extensive remodeling in the right shoulder joint consistent with degenerative disease. Severe scoliosis in the thoracolumbar spine.  Review of the MIP images confirms the above findings.  IMPRESSION: Negative for pulmonary embolism.  Trace amount of left pleural fluid. Otherwise, no acute chest findings.  Stable small left pulmonary nodules as described.  Hiatal hernia.   Electronically Signed   By: Markus Daft M.D.   On: 11/04/2014 17:42   Dg Chest Port 1 View  11/04/2014  CLINICAL DATA:  Chest pain and shortness of breath.  EXAM: PORTABLE CHEST - 1 VIEW  COMPARISON:  12/15/2013  FINDINGS: Cardiac silhouette is mildly enlarged. Density with air-fluid level projecting over the lower mediastinum corresponds to previously seen hiatal hernia and appears smaller than on the prior study. The lungs are hypoinflated without evidence of airspace consolidation, edema, pleural effusion, or pneumothorax. Thoracic levoscoliosis is noted. Degenerative changes are noted at the right glenohumeral joint.  IMPRESSION: Shallow inspiration without evidence of acute air space disease.   Electronically Signed   By: Logan Bores   On: 11/04/2014 13:16    Microbiology: No results found for this or any previous visit (from the past 240 hour(s)).   Labs: Basic Metabolic Panel:  Recent Labs Lab 11/04/14 1434 11/04/14 2202  NA 143  --   K 3.8  --   CL 102  --   CO2 30  --   GLUCOSE 111*  --   BUN 22  --   CREATININE 0.55 0.52  CALCIUM 9.5  --    Liver Function Tests: No results for input(s): AST, ALT, ALKPHOS, BILITOT, PROT, ALBUMIN in the last 168 hours. No results for input(s): LIPASE, AMYLASE in the last 168 hours. No results for input(s):  AMMONIA in the last 168 hours. CBC:  Recent Labs Lab 11/04/14 1305 11/04/14 2202  WBC 6.7 5.4  HGB 12.7 12.8  HCT 38.0 38.2  MCV 92.0 92.0  PLT 193 205   Cardiac Enzymes:  Recent Labs Lab 11/04/14 2202 11/05/14 0250 11/05/14 0859  TROPONINI <0.30 <0.30 <0.30   BNP: BNP (last 3 results)  Recent Labs  12/03/13 1720  PROBNP 443.7   CBG: No results for input(s): GLUCAP in the last 168 hours.     SignedDebbe Odea, MD [facility] 11/05/2014, 2:09 PM

## 2014-11-05 NOTE — Consult Note (Addendum)
WOC wound consult note Reason for Consult: Consult requested for right foot wound. This is a chronic wound and patient is followed as an outpatient by the wound care center.  She states they are putting "an expensive dressing on every day, which she has at home."  Suspect this is a collagen-type of topical treatment which is not available in the [facility]. Wound type: Full thickness Pressure Ulcer POA: This is NOT a pressure ulcer. Measurement: 4.2X.1cm Wound bed: Dry yellow wound bed, appearance consistent with possible collagen dressing. Drainage (amount, consistency, odor)  No odor or drainage Periwound: Intact skin surrounding. Dressing procedure/placement/frequency: Hydrogel applied with 4X4 and kerlex.  Pt can resume previous dressing when discharged, and follow-up with the outpatient wound care center.   Please re-consult if further assistance is needed.  Thank-you,  Julien Girt MSN, [place], [place], [place], [place]

## 2014-11-08 ENCOUNTER — Telehealth: Payer: Self-pay

## 2014-11-08 NOTE — Telephone Encounter (Signed)
Admit date: 11/04/2014 Discharge date: 11/05/2014  Reason for admission: chest pain  Information below provided by patient and son, Chinyere Galiano.    Transition Care Management Follow-up Telephone Call  How have you been since you were released from the hospital? Pt states that's she's "trying to get better."  Pt denies chest pain, n/v, fever, arm pain, abdominal pain, jaw pain, and diaphoresis.  She says that she does get short of breath at times.  [name] says they monitor her oxygen saturation to ensure her oxygen levels are appropriate.  [name] was encouraged to check patient's levels.  If oxygen saturation is abnormal, take patient to ER or call EMS.  [name] stated understanding.  Pt wears oxygen at night @ 2.5L.  She's increasing activity slowly. Son encourages food and drink (i.e. Boost).     Do you understand why you were in the hospital? yes   Do you understand the discharge instructions? yes  Items Reviewed:  Medications reviewed: yes  Allergies reviewed: yes  Dietary changes reviewed: yes, heart healthy  Referrals reviewed: n/a   Functional Questionnaire:   Activities of Daily Living (ADLs):   She states they are independent in the following: ambulation, feeding, continence, toileting and dressing States they require assistance with the following: bathing and hygiene and grooming   Son has sitter to come in to assist with patient's care.     Any transportation issues/concerns?: no   Any patient concerns? Yes, non-healing wound on top right foot.  Pt was encouraged to call Dr. Jerline Pain for further instructions.     Confirmed importance and date/time of follow-up visits scheduled: yes   Confirmed with patient if condition begins to worsen call PCP or go to the ER.  Patient was given the Call-a-Nurse line [phone]: yes  Hospital follow up appointment with Dr. Etter Sjogren scheduled on 11/16/14 @ 11:30 am.

## 2014-11-09 ENCOUNTER — Other Ambulatory Visit: Payer: Self-pay | Admitting: Family Medicine

## 2014-11-09 ENCOUNTER — Ambulatory Visit: Payer: Medicare Other

## 2014-11-09 ENCOUNTER — Ambulatory Visit (INDEPENDENT_AMBULATORY_CARE_PROVIDER_SITE_OTHER): Payer: Medicare Other | Admitting: *Deleted

## 2014-11-09 ENCOUNTER — Encounter: Payer: Medicare Other | Admitting: Family Medicine

## 2014-11-09 DIAGNOSIS — Z23 Encounter for immunization: Secondary | ICD-10-CM

## 2014-11-09 DIAGNOSIS — G894 Chronic pain syndrome: Secondary | ICD-10-CM

## 2014-11-09 MED ORDER — FENTANYL 12 MCG/HR TD PT72: 12.5000 ug | MEDICATED_PATCH | TRANSDERMAL | Status: DC

## 2014-11-10 DIAGNOSIS — L97511 Non-pressure chronic ulcer of other part of right foot limited to breakdown of skin: Secondary | ICD-10-CM | POA: Diagnosis not present

## 2014-11-10 DIAGNOSIS — I87331 Chronic venous hypertension (idiopathic) with ulcer and inflammation of right lower extremity: Secondary | ICD-10-CM | POA: Diagnosis not present

## 2014-11-16 ENCOUNTER — Encounter: Payer: Self-pay | Admitting: Family Medicine

## 2014-11-16 ENCOUNTER — Ambulatory Visit (INDEPENDENT_AMBULATORY_CARE_PROVIDER_SITE_OTHER): Payer: Medicare Other | Admitting: Family Medicine

## 2014-11-16 VITALS — BP 121/65 | HR 93 | Temp 97.6°F | Resp 18 | Wt 78.0 lb

## 2014-11-16 DIAGNOSIS — K219 Gastro-esophageal reflux disease without esophagitis: Secondary | ICD-10-CM

## 2014-11-16 DIAGNOSIS — Z23 Encounter for immunization: Secondary | ICD-10-CM

## 2014-11-16 DIAGNOSIS — R0602 Shortness of breath: Secondary | ICD-10-CM

## 2014-11-16 DIAGNOSIS — E43 Unspecified severe protein-calorie malnutrition: Secondary | ICD-10-CM

## 2014-11-16 MED ORDER — OMEPRAZOLE 40 MG PO CPDR: 40.0000 mg | DELAYED_RELEASE_CAPSULE | Freq: Every day | ORAL | Status: DC

## 2014-11-16 NOTE — Addendum Note (Signed)
Addended by: Rosalita Chessman on: 11/16/2014 12:50 PM   Modules accepted: Orders

## 2014-11-16 NOTE — Assessment & Plan Note (Signed)
With mild pulm htn Pt states she does ok using O2 at night and nebs prn

## 2014-11-16 NOTE — Assessment & Plan Note (Addendum)
prilosec 40 mg daily Handout on gerd diet given to pt Suspect it was the cause of her chest pain See d/c summary rto prn

## 2014-11-16 NOTE — Progress Notes (Signed)
Subjective:    Patient ID: Kerry Myers Seen, female    DOB: [birth year], 80 y.o.   MRN: [id number]  HPI Pt is here with daughter to f/u from hospitalization for chest pain.  She had cardiac enz done and ct angio-- all neg.  Pt was put on omeprazole 40 mg and chest pain seemed to improve.  Pt states the pain comes and goes but is mostly much better.  No new complaints.  They are still having trouble getting her to eat enough.                                         Past Medical History  Diagnosis Date  . GERD (gastroesophageal reflux disease)   . Bladder incontinence   . Neuropathic pain   . RLS (restless legs syndrome)   . Arthritis   . LBBB (left bundle branch block)    History   Social History  . Marital Status: Widowed    Spouse Name: N/A    Number of Children: N/A  . Years of Education: N/A   Occupational History  . Not on file.   Social History Main Topics  . Smoking status: Never Smoker   . Smokeless tobacco: Never Used  . Alcohol Use: No  . Drug Use: No  . Sexual Activity: Not on file   Other Topics Concern  . Not on file   Social History Narrative   [name] does not currently have medications on file. Current Outpatient Prescriptions  Medication Sig Dispense Refill  . albuterol (PROVENTIL HFA;VENTOLIN HFA) 108 (90 BASE) MCG/ACT inhaler Inhale 2 puffs into the lungs every 6 (six) hours as needed for wheezing. 1 Inhaler 0  . Calcium Carb-Cholecalciferol (CALCIUM 600 + D PO) Take 1 tablet by mouth 2 (two) times daily.    . Cholecalciferol (VITAMIN D3) 5000 UNITS CAPS Take 1 capsule by mouth daily.    [name] Calcium (STOOL SOFTENER PO) Take 2 tablets by mouth 2 (two) times daily.    . fentaNYL (DURAGESIC - DOSED MCG/HR) 12 MCG/HR Place 1 patch (12.5 mcg total) onto the skin every 3 (three) days. 10 patch 0  . gabapentin (NEURONTIN) 100 MG capsule Take 100-200 mg by mouth 3 (three) times daily. TAKES 2 CAPS IN AM, 2 CAPS MIDDAY AND 1 CAP AT BEDTIME    .  L-Methylfolate-B6-B12 (FOLTANX) 3-35-2 MG TABS Take 1 tablet by mouth 2 (two) times daily. TAKES IN AM AND AT BEDTIME    . magnesium hydroxide (MILK OF MAGNESIA) 400 MG/5ML suspension Take by mouth daily as needed for mild constipation.    . Multiple Vitamin (MULTIVITAMIN WITH MINERALS) TABS Take 1 tablet by mouth every morning.     . Omega-3 Fatty Acids (FISH OIL) 1000 MG CPDR Take 2,000 mg by mouth daily with lunch.     Marland Kitchen omeprazole (PRILOSEC) 40 MG capsule Take 1 capsule (40 mg total) by mouth at bedtime. 90 capsule 3  . oxybutynin (DITROPAN-XL) 10 MG 24 hr tablet Take 1 tablet (10 mg total) by mouth daily. (Patient taking differently: Take 10 mg by mouth every morning. ) 90 tablet 3  . polyethylene glycol powder (GLYCOLAX/MIRALAX) powder Take 17 g by mouth daily as needed for moderate constipation.     . pramipexole (MIRAPEX) 0.5 MG tablet Take 1 tablet (0.5 mg  total) by mouth at bedtime. 90 tablet 1  .  traMADol (ULTRAM) 50 MG tablet Take 1 tablet (50 mg total) by mouth 2 (two) times daily as needed. 180 tablet 0   No current facility-administered medications for this visit.     Review of Systems As above    Objective:   Physical Exam  BP 121/65 mmHg  Pulse 93  Temp(Src) 97.6 F (36.4 C) (Oral)  Resp 18  Wt 78 lb (35.381 kg) General appearance: alert, cooperative, appears stated age and cachectic Throat: lips, mucosa, and tongue normal; teeth and gums normal Neck: no adenopathy, supple, symmetrical, trachea midline and thyroid not enlarged, symmetric, no tenderness/mass/nodules Lungs: clear to auscultation bilaterally Heart: S1, S2 normal Extremities: extremities normal, atraumatic, no cyanosis or edema       Assessment & Plan:

## 2014-11-16 NOTE — Progress Notes (Signed)
Pre visit review using our clinic review tool, if applicable. No additional management support is needed unless otherwise documented below in the visit note. 

## 2014-11-16 NOTE — Assessment & Plan Note (Signed)
Add ensure or boost to diet--- try to get 2-3 in a day with meals , not in place of

## 2014-11-16 NOTE — Patient Instructions (Signed)

## 2014-11-17 DIAGNOSIS — L97511 Non-pressure chronic ulcer of other part of right foot limited to breakdown of skin: Secondary | ICD-10-CM | POA: Diagnosis not present

## 2014-11-17 DIAGNOSIS — I87331 Chronic venous hypertension (idiopathic) with ulcer and inflammation of right lower extremity: Secondary | ICD-10-CM | POA: Diagnosis not present

## 2014-11-24 DIAGNOSIS — L97511 Non-pressure chronic ulcer of other part of right foot limited to breakdown of skin: Secondary | ICD-10-CM | POA: Diagnosis not present

## 2014-11-24 DIAGNOSIS — I87331 Chronic venous hypertension (idiopathic) with ulcer and inflammation of right lower extremity: Secondary | ICD-10-CM | POA: Diagnosis not present

## 2014-12-01 ENCOUNTER — Encounter (HOSPITAL_BASED_OUTPATIENT_CLINIC_OR_DEPARTMENT_OTHER): Payer: Medicare Other | Attending: General Surgery

## 2014-12-01 DIAGNOSIS — X58XXXA Exposure to other specified factors, initial encounter: Secondary | ICD-10-CM | POA: Insufficient documentation

## 2014-12-01 DIAGNOSIS — S91301A Unspecified open wound, right foot, initial encounter: Secondary | ICD-10-CM | POA: Diagnosis not present

## 2014-12-01 DIAGNOSIS — G629 Polyneuropathy, unspecified: Secondary | ICD-10-CM | POA: Insufficient documentation

## 2014-12-08 ENCOUNTER — Telehealth: Payer: Self-pay | Admitting: Family Medicine

## 2014-12-08 DIAGNOSIS — S91301A Unspecified open wound, right foot, initial encounter: Secondary | ICD-10-CM | POA: Diagnosis not present

## 2014-12-08 DIAGNOSIS — G629 Polyneuropathy, unspecified: Secondary | ICD-10-CM | POA: Diagnosis not present

## 2014-12-08 DIAGNOSIS — G894 Chronic pain syndrome: Secondary | ICD-10-CM

## 2014-12-08 NOTE — Telephone Encounter (Signed)
Caller name: fred Relation to pt: son Call back number: [phone] Pharmacy:  Reason for call:   Patient son requesting a new fentanol rx

## 2014-12-09 MED ORDER — FENTANYL 12 MCG/HR TD PT72: 12.5000 ug | MEDICATED_PATCH | TRANSDERMAL | Status: DC

## 2014-12-09 NOTE — Telephone Encounter (Signed)
Refill x1 

## 2014-12-09 NOTE — Telephone Encounter (Signed)
[name] aware Rx ready for pick up.       KP

## 2014-12-09 NOTE — Telephone Encounter (Signed)
Last seen 11/16/14 and filled 11/09/14 #10   Please advise    KP

## 2014-12-15 DIAGNOSIS — S91301A Unspecified open wound, right foot, initial encounter: Secondary | ICD-10-CM | POA: Diagnosis not present

## 2014-12-15 DIAGNOSIS — G629 Polyneuropathy, unspecified: Secondary | ICD-10-CM | POA: Diagnosis not present

## 2014-12-22 ENCOUNTER — Telehealth: Payer: Self-pay | Admitting: Family Medicine

## 2014-12-22 DIAGNOSIS — S91301A Unspecified open wound, right foot, initial encounter: Secondary | ICD-10-CM | POA: Diagnosis not present

## 2014-12-22 DIAGNOSIS — G629 Polyneuropathy, unspecified: Secondary | ICD-10-CM | POA: Diagnosis not present

## 2014-12-22 NOTE — Telephone Encounter (Signed)
Spoke with [name] and he is aware that Mrs. Kerry Myers did not get any labs done but she is scheduled for a CPE on 1/12 he voiced understanding and thanked me for the return call.      KP

## 2014-12-22 NOTE — Telephone Encounter (Signed)
Caller name: tim Relation to pt: son Call back number: [phone] Pharmacy:  Reason for call:   Patient son requesting last labs

## 2014-12-29 DIAGNOSIS — S91301A Unspecified open wound, right foot, initial encounter: Secondary | ICD-10-CM | POA: Diagnosis not present

## 2015-01-03 ENCOUNTER — Telehealth: Payer: Self-pay | Admitting: Family Medicine

## 2015-01-03 DIAGNOSIS — G894 Chronic pain syndrome: Secondary | ICD-10-CM

## 2015-01-03 MED ORDER — FENTANYL 12 MCG/HR TD PT72: 12.5000 ug | MEDICATED_PATCH | TRANSDERMAL | Status: DC

## 2015-01-03 NOTE — Telephone Encounter (Signed)
Last seen 11/16/14 and filled 12/09/14 #10 No UDS on file  Please advise     KP

## 2015-01-03 NOTE — Telephone Encounter (Signed)
Refill x1-- will have to get urine next time she is in--- 81 years old--- very difficult to get to office

## 2015-01-03 NOTE — Telephone Encounter (Signed)
Caller name:[name] Lantzy Relation to pt:son Call back [phone] Pharmacy:  Reason for call: pt is needing rx fentaNYL (DURAGESIC - DOSED MCG/HR) 12 MCG/HR , please call when available for pick up

## 2015-01-03 NOTE — Telephone Encounter (Signed)
Kerry Myers has been made aware that the Rx is ready for pick up.      KP

## 2015-01-05 ENCOUNTER — Encounter (HOSPITAL_BASED_OUTPATIENT_CLINIC_OR_DEPARTMENT_OTHER): Payer: Medicare Other | Attending: General Surgery

## 2015-01-05 DIAGNOSIS — L97511 Non-pressure chronic ulcer of other part of right foot limited to breakdown of skin: Secondary | ICD-10-CM | POA: Diagnosis not present

## 2015-01-11 ENCOUNTER — Encounter: Payer: Self-pay | Admitting: Family Medicine

## 2015-01-11 ENCOUNTER — Ambulatory Visit (INDEPENDENT_AMBULATORY_CARE_PROVIDER_SITE_OTHER): Payer: Medicare Other | Admitting: Family Medicine

## 2015-01-11 VITALS — BP 110/58 | Temp 97.6°F | Resp 16 | Wt 79.6 lb

## 2015-01-11 DIAGNOSIS — K219 Gastro-esophageal reflux disease without esophagitis: Secondary | ICD-10-CM

## 2015-01-11 DIAGNOSIS — Z23 Encounter for immunization: Secondary | ICD-10-CM

## 2015-01-11 DIAGNOSIS — S91301D Unspecified open wound, right foot, subsequent encounter: Secondary | ICD-10-CM

## 2015-01-11 DIAGNOSIS — Z0000 Encounter for general adult medical examination without abnormal findings: Secondary | ICD-10-CM

## 2015-01-11 MED ORDER — ZOSTER VACCINE LIVE 19400 UNT/0.65ML ~~LOC~~ SOLR: 0.6500 mL | Freq: Once | SUBCUTANEOUS | Status: DC

## 2015-01-11 NOTE — Progress Notes (Signed)
Pre visit review using our clinic review tool, if applicable. No additional management support is needed unless otherwise documented below in the visit note. 

## 2015-01-11 NOTE — Progress Notes (Signed)
Subjective:    Kerry Myers is a 81 y.o. female who presents for Medicare Annual/Subsequent preventive examination.  Preventive Screening-Counseling & Management  Tobacco History  Smoking status  . Never Smoker   Smokeless tobacco  . Never Used     Problems Prior to Visit 1. Nothing new  Current Problems (verified) Patient Active Problem List   Diagnosis Date Noted  . Chest pain 11/04/2014  . Dyspnea 03/06/2014  . Protein-calorie malnutrition, severe 12/04/2013  . RLS (restless legs syndrome)   . Bladder incontinence   . GERD (gastroesophageal reflux disease)   . Hypoxia 12/03/2013  . PNA (pneumonia) 12/03/2013  . CAP (community acquired pneumonia) 12/03/2013  . Acute and chronic respiratory failure 03/26/2013  . Dysphagia, unspecified 03/26/2013  . SOB (shortness of breath) 03/23/2013  . Aspiration pneumonia 03/23/2013  . Generalized weakness 03/23/2013  . Hypoxemia 03/23/2013  . Anemia 10/05/2011  . CORONARY ARTERY DISEASE 02/21/2011  . GERD 02/21/2011  . FACIAL PAIN 02/21/2011  . DYSPHAGIA 02/21/2011  . CALLUSES, FEET, BILATERAL 02/19/2011  . DYSPHAGIA, OROPHARYNGEAL PHASE 02/19/2011  . STRESS INCONTINENCE 05/02/2010  . WOUND, LEG 02/10/2009  . CELLULITIS, LEG, LEFT 01/21/2009  . CALF PAIN, LEFT 01/21/2009  . EDEMA 10/12/2008  . PULMONARY HYPERTENSION, MILD 12/29/2007  . SCOLIOSIS, THORACOLUMBAR 12/29/2007  . RESTLESS LEG SYNDROME 06/17/2007  . PERIPHERAL NEUROPATHY 06/17/2007  . OSTEOARTHRITIS 06/17/2007  . OSTEOPOROSIS 06/17/2007    Medications Prior to Visit Current Outpatient Prescriptions on File Prior to Visit  Medication Sig Dispense Refill  . albuterol (PROVENTIL HFA;VENTOLIN HFA) 108 (90 BASE) MCG/ACT inhaler Inhale 2 puffs into the lungs every 6 (six) hours as needed for wheezing. 1 Inhaler 0  . Calcium Carb-Cholecalciferol (CALCIUM 600 + D PO) Take 1 tablet by mouth 2 (two) times daily.    . Cholecalciferol (VITAMIN D3) 5000 UNITS CAPS  Take 1 capsule by mouth daily.    [name] Calcium (STOOL SOFTENER PO) Take 2 tablets by mouth 2 (two) times daily.    . fentaNYL (DURAGESIC - DOSED MCG/HR) 12 MCG/HR Place 1 patch (12.5 mcg total) onto the skin every 3 (three) days. 10 patch 0  . gabapentin (NEURONTIN) 100 MG capsule Take 100-200 mg by mouth 3 (three) times daily. TAKES 2 CAPS IN AM, 2 CAPS MIDDAY AND 1 CAP AT BEDTIME    . L-Methylfolate-B6-B12 (FOLTANX) 3-35-2 MG TABS Take 1 tablet by mouth 2 (two) times daily. TAKES IN AM AND AT BEDTIME    . magnesium hydroxide (MILK OF MAGNESIA) 400 MG/5ML suspension Take by mouth daily as needed for mild constipation.    . Multiple Vitamin (MULTIVITAMIN WITH MINERALS) TABS Take 1 tablet by mouth every morning.     . Omega-3 Fatty Acids (FISH OIL) 1000 MG CPDR Take 2,000 mg by mouth daily with lunch.     Marland Kitchen omeprazole (PRILOSEC) 40 MG capsule Take 1 capsule (40 mg total) by mouth at bedtime. 90 capsule 3  . oxybutynin (DITROPAN-XL) 10 MG 24 hr tablet Take 1 tablet (10 mg total) by mouth daily. (Patient taking differently: Take 10 mg by mouth every morning. ) 90 tablet 3  . polyethylene glycol powder (GLYCOLAX/MIRALAX) powder Take 17 g by mouth daily as needed for moderate constipation.     . pramipexole (MIRAPEX) 0.5 MG tablet Take 1 tablet (0.5 mg  total) by mouth at bedtime. 90 tablet 1  . traMADol (ULTRAM) 50 MG tablet Take 1 tablet (50 mg total) by mouth 2 (two) times daily as needed. [place]  tablet 0   No current facility-administered medications on file prior to visit.    Current Medications (verified) Current Outpatient Prescriptions  Medication Sig Dispense Refill  . albuterol (PROVENTIL HFA;VENTOLIN HFA) 108 (90 BASE) MCG/ACT inhaler Inhale 2 puffs into the lungs every 6 (six) hours as needed for wheezing. 1 Inhaler 0  . Calcium Carb-Cholecalciferol (CALCIUM 600 + D PO) Take 1 tablet by mouth 2 (two) times daily.    . Cholecalciferol (VITAMIN D3) 5000 UNITS CAPS Take 1 capsule by  mouth daily.    [name] Calcium (STOOL SOFTENER PO) Take 2 tablets by mouth 2 (two) times daily.    . fentaNYL (DURAGESIC - DOSED MCG/HR) 12 MCG/HR Place 1 patch (12.5 mcg total) onto the skin every 3 (three) days. 10 patch 0  . gabapentin (NEURONTIN) 100 MG capsule Take 100-200 mg by mouth 3 (three) times daily. TAKES 2 CAPS IN AM, 2 CAPS MIDDAY AND 1 CAP AT BEDTIME    . L-Methylfolate-B6-B12 (FOLTANX) 3-35-2 MG TABS Take 1 tablet by mouth 2 (two) times daily. TAKES IN AM AND AT BEDTIME    . magnesium hydroxide (MILK OF MAGNESIA) 400 MG/5ML suspension Take by mouth daily as needed for mild constipation.    . Multiple Vitamin (MULTIVITAMIN WITH MINERALS) TABS Take 1 tablet by mouth every morning.     . Omega-3 Fatty Acids (FISH OIL) 1000 MG CPDR Take 2,000 mg by mouth daily with lunch.     Marland Kitchen omeprazole (PRILOSEC) 40 MG capsule Take 1 capsule (40 mg total) by mouth at bedtime. 90 capsule 3  . oxybutynin (DITROPAN-XL) 10 MG 24 hr tablet Take 1 tablet (10 mg total) by mouth daily. (Patient taking differently: Take 10 mg by mouth every morning. ) 90 tablet 3  . polyethylene glycol powder (GLYCOLAX/MIRALAX) powder Take 17 g by mouth daily as needed for moderate constipation.     . pramipexole (MIRAPEX) 0.5 MG tablet Take 1 tablet (0.5 mg  total) by mouth at bedtime. 90 tablet 1  . traMADol (ULTRAM) 50 MG tablet Take 1 tablet (50 mg total) by mouth 2 (two) times daily as needed. 180 tablet 0   No current facility-administered medications for this visit.     Allergies (verified) Review of patient's allergies indicates no known allergies.   PAST HISTORY  Family History History reviewed. No pertinent family history.  Social History History  Substance Use Topics  . Smoking status: Never Smoker   . Smokeless tobacco: Never Used  . Alcohol Use: No     Are there smokers in your home (other than you)? No  Risk Factors Current exercise habits: The patient does not participate in regular  exercise at present.  Dietary issues discussed: na   Cardiac risk factors: advanced age (older than 23 for men, 86 for women) and sedentary lifestyle.  Depression Screen (Note: if answer to either of the following is "Yes", a more complete depression screening is indicated)   Over the past two weeks, have you felt down, depressed or hopeless? No  Over the past two weeks, have you felt little interest or pleasure in doing things? No  Have you lost interest or pleasure in daily life? No  Do you often feel hopeless? No  Do you cry easily over simple problems? No  Activities of Daily Living In your present state of health, do you have any difficulty performing the following activities?:  Driving? Yes Managing money?  Yes Feeding yourself? No Getting from bed to chair? no Climbing  a flight of stairs? Yes Preparing food and eating?: Yes Bathing or showering? Yes Getting dressed: No Getting to the toilet? No Using the toilet:No Moving around from place to place: No In the past year have you fallen or had a near fall?:Yes   Are you sexually active?  No  Do you have more than one partner?  No  Hearing Difficulties: Yes Do you often ask people to speak up or repeat themselves? Yes Do you experience ringing or noises in your ears? No Do you have difficulty understanding soft or whispered voices? Yes   Do you feel that you have a problem with memory? Yes  Do you often misplace items? Yes  Do you feel safe at home?  Yes  Cognitive Testing  Alert? Yes  Normal Appearance?Yes  Oriented to person? Yes  Place? Yes   Time? Yes  Recall of three objects?  Yes  Can perform simple calculations? Yes  Displays appropriate judgment?Yes  Can read the correct time from a watch face?Yes   Advanced Directives have been discussed with the patient? Yes  List the Names of Other Physician/Practitioners you currently use: 1. opth-- [facility] 2. Dentist-- Riley Churches 3.  Wound-- [facility] 4.   Derm-- Tonia Brooms  Indicate any recent Medical Services you may have received from other than [facility] providers in the past year (date may be approximate).  Immunization History  Administered Date(s) Administered  . Influenza Split 10/05/2011  . Influenza Whole 10/29/2008  . Influenza, High Dose Seasonal PF 11/09/2014  . Pneumococcal Conjugate-13 11/16/2014  . Pneumococcal Polysaccharide-23 10/31/2013    Screening Tests Health Maintenance  Topic Date Due  . TETANUS/TDAP  09[birth year]/1940  . COLONOSCOPY  09/09/1971  . ZOSTAVAX  09/08/1981  . DEXA SCAN  09/08/1986  . INFLUENZA VACCINE  08/01/2015  . PNEUMOCOCCAL POLYSACCHARIDE VACCINE AGE 56 AND OVER  Completed    All answers were reviewed with the patient and necessary referrals were made:  Garnet Koyanagi, DO   01/11/2015   History reviewed:  She  has a past medical history of GERD (gastroesophageal reflux disease); Bladder incontinence; Neuropathic pain; RLS (restless legs syndrome); Arthritis; and LBBB (left bundle branch block). She  does not have any pertinent problems on file. She  has past surgical history that includes Tonsillectomy; Abdominal hysterectomy; Incontinence surgery; Excision Morton's neuroma; Eye surgery; and Amputation (04/30/2012). Her family history is not on file. She  reports that she has never smoked. She has never used smokeless tobacco. She reports that she does not drink alcohol or use illicit drugs. She has a current medication list which includes the following prescription(s): albuterol, calcium carb-cholecalciferol, vitamin d3, docusate calcium, fentanyl, gabapentin, foltanx, magnesium hydroxide, multivitamin with minerals, fish oil, omeprazole, oxybutynin, polyethylene glycol powder, pramipexole, and tramadol. Current Outpatient Prescriptions on File Prior to Visit  Medication Sig Dispense Refill  . albuterol (PROVENTIL HFA;VENTOLIN HFA) 108 (90 BASE) MCG/ACT inhaler Inhale 2 puffs into the lungs every 6 (six) hours  as needed for wheezing. 1 Inhaler 0  . Calcium Carb-Cholecalciferol (CALCIUM 600 + D PO) Take 1 tablet by mouth 2 (two) times daily.    . Cholecalciferol (VITAMIN D3) 5000 UNITS CAPS Take 1 capsule by mouth daily.    [name] Calcium (STOOL SOFTENER PO) Take 2 tablets by mouth 2 (two) times daily.    . fentaNYL (DURAGESIC - DOSED MCG/HR) 12 MCG/HR Place 1 patch (12.5 mcg total) onto the skin every 3 (three) days. 10 patch 0  . gabapentin (NEURONTIN)  100 MG capsule Take 100-200 mg by mouth 3 (three) times daily. TAKES 2 CAPS IN AM, 2 CAPS MIDDAY AND 1 CAP AT BEDTIME    . L-Methylfolate-B6-B12 (FOLTANX) 3-35-2 MG TABS Take 1 tablet by mouth 2 (two) times daily. TAKES IN AM AND AT BEDTIME    . magnesium hydroxide (MILK OF MAGNESIA) 400 MG/5ML suspension Take by mouth daily as needed for mild constipation.    . Multiple Vitamin (MULTIVITAMIN WITH MINERALS) TABS Take 1 tablet by mouth every morning.     . Omega-3 Fatty Acids (FISH OIL) 1000 MG CPDR Take 2,000 mg by mouth daily with lunch.     Marland Kitchen omeprazole (PRILOSEC) 40 MG capsule Take 1 capsule (40 mg total) by mouth at bedtime. 90 capsule 3  . oxybutynin (DITROPAN-XL) 10 MG 24 hr tablet Take 1 tablet (10 mg total) by mouth daily. (Patient taking differently: Take 10 mg by mouth every morning. ) 90 tablet 3  . polyethylene glycol powder (GLYCOLAX/MIRALAX) powder Take 17 g by mouth daily as needed for moderate constipation.     . pramipexole (MIRAPEX) 0.5 MG tablet Take 1 tablet (0.5 mg  total) by mouth at bedtime. 90 tablet 1  . traMADol (ULTRAM) 50 MG tablet Take 1 tablet (50 mg total) by mouth 2 (two) times daily as needed. 180 tablet 0   No current facility-administered medications on file prior to visit.   She has No Known Allergies.  Review of Systems  Review of Systems  Constitutional: Negative for activity change, appetite change and fatigue.  HENT: Negative for hearing loss, congestion, tinnitus and ear discharge.   Eyes: Negative for  visual disturbance (see optho q1y -- vision corrected to 20/20 with glasses).  Respiratory: Negative for cough, chest tightness and shortness of breath.   Cardiovascular: Negative for chest pain, palpitations and leg swelling.  Gastrointestinal: Negative for abdominal pain, diarrhea, constipation and abdominal distention.  Genitourinary: Negative for urgency, frequency, decreased urine volume and difficulty urinating.  Musculoskeletal: Negative for back pain, arthralgias and gait problem.  Skin: Negative for color change, pallor and rash.  Neurological: Negative for dizziness, light-headedness, numbness and headaches.  Hematological: Negative for adenopathy. Does not bruise/bleed easily.  Psychiatric/Behavioral: Negative for suicidal ideas, confusion, sleep disturbance, self-injury, dysphoric mood, decreased concentration and agitation.  Pt is able to read and write and can do all ADLs No risk for falling No abuse/ violence in home      Objective:     Vision by Snellen chart: opth Body mass index is 16.64 kg/(m^2). BP 110/58 mmHg  Temp(Src) 97.6 F (36.4 C) (Oral)  Resp 16  Wt 79 lb 9.6 oz (36.106 kg)  BP 110/58 mmHg  Temp(Src) 97.6 F (36.4 C) (Oral)  Resp 16  Wt 79 lb 9.6 oz (36.106 kg) General appearance: alert, cooperative, appears stated age and no distress Head: Normocephalic, without obvious abnormality, atraumatic Eyes: conjunctivae/corneas clear. PERRL, EOM's intact. Fundi benign. Ears: normal TM's and external ear canals both ears Nose: Nares normal. Septum midline. Mucosa normal. No drainage or sinus tenderness. Throat: lips, mucosa, and tongue normal; teeth and gums normal Neck: no adenopathy, no carotid bruit, no JVD, supple, symmetrical, trachea midline and thyroid not enlarged, symmetric, no tenderness/mass/nodules Back: symmetric, no curvature. ROM normal. No CVA tenderness. Lungs: clear to auscultation bilaterally Breasts: normal appearance, no masses or  tenderness Heart: regular rate and rhythm, S1, S2 normal, no murmur, click, rub or gallop Abdomen: soft, non-tender; bowel sounds normal; no masses,  no organomegaly Pelvic:  deferred Extremities: extremities normal, atraumatic, no cyanosis or edema Pulses: 2+ and symmetric Skin: Skin color, texture, turgor normal. No rashes or lesions Lymph nodes: Cervical, supraclavicular, and axillary nodes normal. Neurologic: Alert and oriented X 3, normal strength and tone. Normal symmetric reflexes. Normal coordination and gait Psych- no depression, no anxiety      Assessment:     cpe      Plan:     During the course of the visit the patient was educated and counseled about appropriate screening and preventive services including:    Pneumococcal vaccine   Influenza vaccine  Advanced directives: has an advanced directive - a copy HAS NOT been provided. =colonoscopy, bmd, mammogram , zostavax etc-- not being done at family request Diet review for nutrition referral? Yes ____  Not Indicated _x___   Patient Instructions (the written plan) was given to the patient.  Medicare Attestation I have personally reviewed: The patient's medical and social history Their use of alcohol, tobacco or illicit drugs Their current medications and supplements The patient's functional ability including ADLs,fall risks, home safety risks, cognitive, and hearing and visual impairment Diet and physical activities Evidence for depression or mood disorders  The patient's weight, height, BMI, and visual acuity have been recorded in the chart.  I have made referrals, counseling, and provided education to the patient based on review of the above and I have provided the patient with a written person. 1. Wound, open, foot with complication, right, subsequent encounter Per wound clinic  2. Gastroesophageal reflux disease without esophagitis con't prilosec  3. Preventative health care   4. Need for shingles  vaccine  - zoster vaccine live, PF, (ZOSTAVAX) 20254 UNT/0.65ML injection; Inject 19,400 Units into the skin once.  Dispense: 1 vial; Refill: [place], DO   01/11/2015

## 2015-01-11 NOTE — Patient Instructions (Signed)
Preventive Care for Adults A healthy lifestyle and preventive care can promote health and wellness. Preventive health guidelines for women include the following key practices.  A routine yearly physical is a good way to check with your health care provider about your health and preventive screening. It is a chance to share any concerns and updates on your health and to receive a thorough exam.  Visit your dentist for a routine exam and preventive care every 6 months. Brush your teeth twice a day and floss once a day. Good oral hygiene prevents tooth decay and gum disease.  The frequency of eye exams is based on your age, health, family medical history, use of contact lenses, and other factors. Follow your health care provider's recommendations for frequency of eye exams.  Eat a healthy diet. Foods like vegetables, fruits, whole grains, low-fat dairy products, and lean protein foods contain the nutrients you need without too many calories. Decrease your intake of foods high in solid fats, added sugars, and salt. Eat the right amount of calories for you.Get information about a proper diet from your health care provider, if necessary.  Regular physical exercise is one of the most important things you can do for your health. Most adults should get at least 150 minutes of moderate-intensity exercise (any activity that increases your heart rate and causes you to sweat) each week. In addition, most adults need muscle-strengthening exercises on 2 or more days a week.  Maintain a healthy weight. The body mass index (BMI) is a screening tool to identify possible weight problems. It provides an estimate of body fat based on height and weight. Your health care provider can find your BMI and can help you achieve or maintain a healthy weight.For adults 20 years and older:  A BMI below 18.5 is considered underweight.  A BMI of 18.5 to 24.9 is normal.  A BMI of 25 to 29.9 is considered overweight.  A BMI of  30 and above is considered obese.  Maintain normal blood lipids and cholesterol levels by exercising and minimizing your intake of saturated fat. Eat a balanced diet with plenty of fruit and vegetables. Blood tests for lipids and cholesterol should begin at age 76 and be repeated every 5 years. If your lipid or cholesterol levels are high, you are over 50, or you are at high risk for heart disease, you may need your cholesterol levels checked more frequently.Ongoing high lipid and cholesterol levels should be treated with medicines if diet and exercise are not working.  If you smoke, find out from your health care provider how to quit. If you do not use tobacco, do not start.  Lung cancer screening is recommended for adults aged 22-80 years who are at high risk for developing lung cancer because of a history of smoking. A yearly low-dose CT scan of the lungs is recommended for people who have at least a 30-pack-year history of smoking and are a current smoker or have quit within the past 15 years. A pack year of smoking is smoking an average of 1 pack of cigarettes a day for 1 year (for example: 1 pack a day for 30 years or 2 packs a day for 15 years). Yearly screening should continue until the smoker has stopped smoking for at least 15 years. Yearly screening should be stopped for people who develop a health problem that would prevent them from having lung cancer treatment.  If you are pregnant, do not drink alcohol. If you are breastfeeding,  be very cautious about drinking alcohol. If you are not pregnant and choose to drink alcohol, do not have more than 1 drink per day. One drink is considered to be 12 ounces (355 mL) of beer, 5 ounces (148 mL) of wine, or 1.5 ounces (44 mL) of liquor.  Avoid use of street drugs. Do not share needles with anyone. Ask for help if you need support or instructions about stopping the use of drugs.  High blood pressure causes heart disease and increases the risk of  stroke. Your blood pressure should be checked at least every 1 to 2 years. Ongoing high blood pressure should be treated with medicines if weight loss and exercise do not work.  If you are 75-52 years old, ask your health care provider if you should take aspirin to prevent strokes.  Diabetes screening involves taking a blood sample to check your fasting blood sugar level. This should be done once every 3 years, after age 15, if you are within normal weight and without risk factors for diabetes. Testing should be considered at a younger age or be carried out more frequently if you are overweight and have at least 1 risk factor for diabetes.  Breast cancer screening is essential preventive care for women. You should practice "breast self-awareness." This means understanding the normal appearance and feel of your breasts and may include breast self-examination. Any changes detected, no matter how small, should be reported to a health care provider. Women in their 58s and 30s should have a clinical breast exam (CBE) by a health care provider as part of a regular health exam every 1 to 3 years. After age 16, women should have a CBE every year. Starting at age 53, women should consider having a mammogram (breast X-ray test) every year. Women who have a family history of breast cancer should talk to their health care provider about genetic screening. Women at a high risk of breast cancer should talk to their health care providers about having an MRI and a mammogram every year.  Breast cancer gene (BRCA)-related cancer risk assessment is recommended for women who have family members with BRCA-related cancers. BRCA-related cancers include breast, ovarian, tubal, and peritoneal cancers. Having family members with these cancers may be associated with an increased risk for harmful changes (mutations) in the breast cancer genes BRCA1 and BRCA2. Results of the assessment will determine the need for genetic counseling and  BRCA1 and BRCA2 testing.  Routine pelvic exams to screen for cancer are no longer recommended for nonpregnant women who are considered low risk for cancer of the pelvic organs (ovaries, uterus, and vagina) and who do not have symptoms. Ask your health care provider if a screening pelvic exam is right for you.  If you have had past treatment for cervical cancer or a condition that could lead to cancer, you need Pap tests and screening for cancer for at least 20 years after your treatment. If Pap tests have been discontinued, your risk factors (such as having a new sexual partner) need to be reassessed to determine if screening should be resumed. Some women have medical problems that increase the chance of getting cervical cancer. In these cases, your health care provider may recommend more frequent screening and Pap tests.  The HPV test is an additional test that may be used for cervical cancer screening. The HPV test looks for the virus that can cause the cell changes on the cervix. The cells collected during the Pap test can be  tested for HPV. The HPV test could be used to screen women aged 30 years and older, and should be used in women of any age who have unclear Pap test results. After the age of 30, women should have HPV testing at the same frequency as a Pap test.  Colorectal cancer can be detected and often prevented. Most routine colorectal cancer screening begins at the age of 50 years and continues through age 75 years. However, your health care provider may recommend screening at an earlier age if you have risk factors for colon cancer. On a yearly basis, your health care provider may provide home test kits to check for hidden blood in the stool. Use of a small camera at the end of a tube, to directly examine the colon (sigmoidoscopy or colonoscopy), can detect the earliest forms of colorectal cancer. Talk to your health care provider about this at age 50, when routine screening begins. Direct  exam of the colon should be repeated every 5-10 years through age 75 years, unless early forms of pre-cancerous polyps or small growths are found.  People who are at an increased risk for hepatitis B should be screened for this virus. You are considered at high risk for hepatitis B if:  You were born in a country where hepatitis B occurs often. Talk with your health care provider about which countries are considered high risk.  Your parents were born in a high-risk country and you have not received a shot to protect against hepatitis B (hepatitis B vaccine).  You have HIV or AIDS.  You use needles to inject street drugs.  You live with, or have sex with, someone who has hepatitis B.  You get hemodialysis treatment.  You take certain medicines for conditions like cancer, organ transplantation, and autoimmune conditions.  Hepatitis C blood testing is recommended for all people born from 1945 through 1965 and any individual with known risks for hepatitis C.  Practice safe sex. Use condoms and avoid high-risk sexual practices to reduce the spread of sexually transmitted infections (STIs). STIs include gonorrhea, chlamydia, syphilis, trichomonas, herpes, HPV, and human immunodeficiency virus (HIV). Herpes, HIV, and HPV are viral illnesses that have no cure. They can result in disability, cancer, and death.  You should be screened for sexually transmitted illnesses (STIs) including gonorrhea and chlamydia if:  You are sexually active and are younger than 24 years.  You are older than 24 years and your health care provider tells you that you are at risk for this type of infection.  Your sexual activity has changed since you were last screened and you are at an increased risk for chlamydia or gonorrhea. Ask your health care provider if you are at risk.  If you are at risk of being infected with HIV, it is recommended that you take a prescription medicine daily to prevent HIV infection. This is  called preexposure prophylaxis (PrEP). You are considered at risk if:  You are a heterosexual woman, are sexually active, and are at increased risk for HIV infection.  You take drugs by injection.  You are sexually active with a partner who has HIV.  Talk with your health care provider about whether you are at high risk of being infected with HIV. If you choose to begin PrEP, you should first be tested for HIV. You should then be tested every 3 months for as long as you are taking PrEP.  Osteoporosis is a disease in which the bones lose minerals and strength   with aging. This can result in serious bone fractures or breaks. The risk of osteoporosis can be identified using a bone density scan. Women ages 65 years and over and women at risk for fractures or osteoporosis should discuss screening with their health care providers. Ask your health care provider whether you should take a calcium supplement or vitamin D to reduce the rate of osteoporosis.  Menopause can be associated with physical symptoms and risks. Hormone replacement therapy is available to decrease symptoms and risks. You should talk to your health care provider about whether hormone replacement therapy is right for you.  Use sunscreen. Apply sunscreen liberally and repeatedly throughout the day. You should seek shade when your shadow is shorter than you. Protect yourself by wearing long sleeves, pants, a wide-brimmed hat, and sunglasses year round, whenever you are outdoors.  Once a month, do a whole body skin exam, using a mirror to look at the skin on your back. Tell your health care provider of new moles, moles that have irregular borders, moles that are larger than a pencil eraser, or moles that have changed in shape or color.  Stay current with required vaccines (immunizations).  Influenza vaccine. All adults should be immunized every year.  Tetanus, diphtheria, and acellular pertussis (Td, Tdap) vaccine. Pregnant women should  receive 1 dose of Tdap vaccine during each pregnancy. The dose should be obtained regardless of the length of time since the last dose. Immunization is preferred during the 27th-36th week of gestation. An adult who has not previously received Tdap or who does not know her vaccine status should receive 1 dose of Tdap. This initial dose should be followed by tetanus and diphtheria toxoids (Td) booster doses every 10 years. Adults with an unknown or incomplete history of completing a 3-dose immunization series with Td-containing vaccines should begin or complete a primary immunization series including a Tdap dose. Adults should receive a Td booster every 10 years.  Varicella vaccine. An adult without evidence of immunity to varicella should receive 2 doses or a second dose if she has previously received 1 dose. Pregnant females who do not have evidence of immunity should receive the first dose after pregnancy. This first dose should be obtained before leaving the health care facility. The second dose should be obtained 4-8 weeks after the first dose.  Human papillomavirus (HPV) vaccine. Females aged 13-26 years who have not received the vaccine previously should obtain the 3-dose series. The vaccine is not recommended for use in pregnant females. However, pregnancy testing is not needed before receiving a dose. If a female is found to be pregnant after receiving a dose, no treatment is needed. In that case, the remaining doses should be delayed until after the pregnancy. Immunization is recommended for any person with an immunocompromised condition through the age of 26 years if she did not get any or all doses earlier. During the 3-dose series, the second dose should be obtained 4-8 weeks after the first dose. The third dose should be obtained 24 weeks after the first dose and 16 weeks after the second dose.  Zoster vaccine. One dose is recommended for adults aged 60 years or older unless certain conditions are  present.  Measles, mumps, and rubella (MMR) vaccine. Adults born before 1957 generally are considered immune to measles and mumps. Adults born in 1957 or later should have 1 or more doses of MMR vaccine unless there is a contraindication to the vaccine or there is laboratory evidence of immunity to   each of the three diseases. A routine second dose of MMR vaccine should be obtained at least 28 days after the first dose for students attending postsecondary schools, health care workers, or international travelers. People who received inactivated measles vaccine or an unknown type of measles vaccine during 1963-1967 should receive 2 doses of MMR vaccine. People who received inactivated mumps vaccine or an unknown type of mumps vaccine before 1979 and are at high risk for mumps infection should consider immunization with 2 doses of MMR vaccine. For females of childbearing age, rubella immunity should be determined. If there is no evidence of immunity, females who are not pregnant should be vaccinated. If there is no evidence of immunity, females who are pregnant should delay immunization until after pregnancy. Unvaccinated health care workers born before 1957 who lack laboratory evidence of measles, mumps, or rubella immunity or laboratory confirmation of disease should consider measles and mumps immunization with 2 doses of MMR vaccine or rubella immunization with 1 dose of MMR vaccine.  Pneumococcal 13-valent conjugate (PCV13) vaccine. When indicated, a person who is uncertain of her immunization history and has no record of immunization should receive the PCV13 vaccine. An adult aged 19 years or older who has certain medical conditions and has not been previously immunized should receive 1 dose of PCV13 vaccine. This PCV13 should be followed with a dose of pneumococcal polysaccharide (PPSV23) vaccine. The PPSV23 vaccine dose should be obtained at least 8 weeks after the dose of PCV13 vaccine. An adult aged 19  years or older who has certain medical conditions and previously received 1 or more doses of PPSV23 vaccine should receive 1 dose of PCV13. The PCV13 vaccine dose should be obtained 1 or more years after the last PPSV23 vaccine dose.  Pneumococcal polysaccharide (PPSV23) vaccine. When PCV13 is also indicated, PCV13 should be obtained first. All adults aged 65 years and older should be immunized. An adult younger than age 65 years who has certain medical conditions should be immunized. Any person who resides in a nursing home or long-term care facility should be immunized. An adult smoker should be immunized. People with an immunocompromised condition and certain other conditions should receive both PCV13 and PPSV23 vaccines. People with human immunodeficiency virus (HIV) infection should be immunized as soon as possible after diagnosis. Immunization during chemotherapy or radiation therapy should be avoided. Routine use of PPSV23 vaccine is not recommended for American Indians, [place] Natives, or people younger than 65 years unless there are medical conditions that require PPSV23 vaccine. When indicated, people who have unknown immunization and have no record of immunization should receive PPSV23 vaccine. One-time revaccination 5 years after the first dose of PPSV23 is recommended for people aged 19-64 years who have chronic kidney failure, nephrotic syndrome, asplenia, or immunocompromised conditions. People who received 1-2 doses of PPSV23 before age 65 years should receive another dose of PPSV23 vaccine at age 65 years or later if at least 5 years have passed since the previous dose. Doses of PPSV23 are not needed for people immunized with PPSV23 at or after age 65 years.  Meningococcal vaccine. Adults with asplenia or persistent complement component deficiencies should receive 2 doses of quadrivalent meningococcal conjugate (MenACWY-D) vaccine. The doses should be obtained at least 2 months apart.  Microbiologists working with certain meningococcal bacteria, military recruits, people at risk during an outbreak, and people who travel to or live in countries with a high rate of meningitis should be immunized. A first-year college student up through age   21 years who is living in a residence hall should receive a dose if she did not receive a dose on or after her 16th birthday. Adults who have certain high-risk conditions should receive one or more doses of vaccine.  Hepatitis A vaccine. Adults who wish to be protected from this disease, have certain high-risk conditions, work with hepatitis A-infected animals, work in hepatitis A research labs, or travel to or work in countries with a high rate of hepatitis A should be immunized. Adults who were previously unvaccinated and who anticipate close contact with an international adoptee during the first 60 days after arrival in the [place] from a country with a high rate of hepatitis A should be immunized.  Hepatitis B vaccine. Adults who wish to be protected from this disease, have certain high-risk conditions, may be exposed to blood or other infectious body fluids, are household contacts or sex partners of hepatitis B positive people, are clients or workers in certain care facilities, or travel to or work in countries with a high rate of hepatitis B should be immunized.  Haemophilus influenzae type b (Hib) vaccine. A previously unvaccinated person with asplenia or sickle cell disease or having a scheduled splenectomy should receive 1 dose of Hib vaccine. Regardless of previous immunization, a recipient of a hematopoietic stem cell transplant should receive a 3-dose series 6-12 months after her successful transplant. Hib vaccine is not recommended for adults with HIV infection. Preventive Services / Frequency Ages 64 to 68 years  Blood pressure check.** / Every 1 to 2 years.  Lipid and cholesterol check.** / Every 5 years beginning at age  22.  Clinical breast exam.** / Every 3 years for women in their 88s and 53s.  BRCA-related cancer risk assessment.** / For women who have family members with a BRCA-related cancer (breast, ovarian, tubal, or peritoneal cancers).  Pap test.** / Every 2 years from ages 90 through 51. Every 3 years starting at age 21 through age 56 or 3 with a history of 3 consecutive normal Pap tests.  HPV screening.** / Every 3 years from ages 24 through ages 1 to 46 with a history of 3 consecutive normal Pap tests.  Hepatitis C blood test.** / For any individual with known risks for hepatitis C.  Skin self-exam. / Monthly.  Influenza vaccine. / Every year.  Tetanus, diphtheria, and acellular pertussis (Tdap, Td) vaccine.** / Consult your health care provider. Pregnant women should receive 1 dose of Tdap vaccine during each pregnancy. 1 dose of Td every 10 years.  Varicella vaccine.** / Consult your health care provider. Pregnant females who do not have evidence of immunity should receive the first dose after pregnancy.  HPV vaccine. / 3 doses over 6 months, if 72 and younger. The vaccine is not recommended for use in pregnant females. However, pregnancy testing is not needed before receiving a dose.  Measles, mumps, rubella (MMR) vaccine.** / You need at least 1 dose of MMR if you were born in 1957 or later. You may also need a 2nd dose. For females of childbearing age, rubella immunity should be determined. If there is no evidence of immunity, females who are not pregnant should be vaccinated. If there is no evidence of immunity, females who are pregnant should delay immunization until after pregnancy.  Pneumococcal 13-valent conjugate (PCV13) vaccine.** / Consult your health care provider.  Pneumococcal polysaccharide (PPSV23) vaccine.** / 1 to 2 doses if you smoke cigarettes or if you have certain conditions.  Meningococcal vaccine.** /  1 dose if you are age 19 to 21 years and a first-year college  student living in a residence hall, or have one of several medical conditions, you need to get vaccinated against meningococcal disease. You may also need additional booster doses.  Hepatitis A vaccine.** / Consult your health care provider.  Hepatitis B vaccine.** / Consult your health care provider.  Haemophilus influenzae type b (Hib) vaccine.** / Consult your health care provider. Ages 40 to 64 years  Blood pressure check.** / Every 1 to 2 years.  Lipid and cholesterol check.** / Every 5 years beginning at age 20 years.  Lung cancer screening. / Every year if you are aged 55-80 years and have a 30-pack-year history of smoking and currently smoke or have quit within the past 15 years. Yearly screening is stopped once you have quit smoking for at least 15 years or develop a health problem that would prevent you from having lung cancer treatment.  Clinical breast exam.** / Every year after age 40 years.  BRCA-related cancer risk assessment.** / For women who have family members with a BRCA-related cancer (breast, ovarian, tubal, or peritoneal cancers).  Mammogram.** / Every year beginning at age 40 years and continuing for as long as you are in good health. Consult with your health care provider.  Pap test.** / Every 3 years starting at age 30 years through age 65 or 70 years with a history of 3 consecutive normal Pap tests.  HPV screening.** / Every 3 years from ages 30 years through ages 65 to 70 years with a history of 3 consecutive normal Pap tests.  Fecal occult blood test (FOBT) of stool. / Every year beginning at age 50 years and continuing until age 75 years. You may not need to do this test if you get a colonoscopy every 10 years.  Flexible sigmoidoscopy or colonoscopy.** / Every 5 years for a flexible sigmoidoscopy or every 10 years for a colonoscopy beginning at age 50 years and continuing until age 75 years.  Hepatitis C blood test.** / For all people born from 1945 through  1965 and any individual with known risks for hepatitis C.  Skin self-exam. / Monthly.  Influenza vaccine. / Every year.  Tetanus, diphtheria, and acellular pertussis (Tdap/Td) vaccine.** / Consult your health care provider. Pregnant women should receive 1 dose of Tdap vaccine during each pregnancy. 1 dose of Td every 10 years.  Varicella vaccine.** / Consult your health care provider. Pregnant females who do not have evidence of immunity should receive the first dose after pregnancy.  Zoster vaccine.** / 1 dose for adults aged 60 years or older.  Measles, mumps, rubella (MMR) vaccine.** / You need at least 1 dose of MMR if you were born in 1957 or later. You may also need a 2nd dose. For females of childbearing age, rubella immunity should be determined. If there is no evidence of immunity, females who are not pregnant should be vaccinated. If there is no evidence of immunity, females who are pregnant should delay immunization until after pregnancy.  Pneumococcal 13-valent conjugate (PCV13) vaccine.** / Consult your health care provider.  Pneumococcal polysaccharide (PPSV23) vaccine.** / 1 to 2 doses if you smoke cigarettes or if you have certain conditions.  Meningococcal vaccine.** / Consult your health care provider.  Hepatitis A vaccine.** / Consult your health care provider.  Hepatitis B vaccine.** / Consult your health care provider.  Haemophilus influenzae type b (Hib) vaccine.** / Consult your health care provider. Ages 65   years and over  Blood pressure check.** / Every 1 to 2 years.  Lipid and cholesterol check.** / Every 5 years beginning at age 22 years.  Lung cancer screening. / Every year if you are aged 73-80 years and have a 30-pack-year history of smoking and currently smoke or have quit within the past 15 years. Yearly screening is stopped once you have quit smoking for at least 15 years or develop a health problem that would prevent you from having lung cancer  treatment.  Clinical breast exam.** / Every year after age 4 years.  BRCA-related cancer risk assessment.** / For women who have family members with a BRCA-related cancer (breast, ovarian, tubal, or peritoneal cancers).  Mammogram.** / Every year beginning at age 40 years and continuing for as long as you are in good health. Consult with your health care provider.  Pap test.** / Every 3 years starting at age 9 years through age 34 or 91 years with 3 consecutive normal Pap tests. Testing can be stopped between 65 and 70 years with 3 consecutive normal Pap tests and no abnormal Pap or HPV tests in the past 10 years.  HPV screening.** / Every 3 years from ages 57 years through ages 64 or 45 years with a history of 3 consecutive normal Pap tests. Testing can be stopped between 65 and 70 years with 3 consecutive normal Pap tests and no abnormal Pap or HPV tests in the past 10 years.  Fecal occult blood test (FOBT) of stool. / Every year beginning at age 15 years and continuing until age 17 years. You may not need to do this test if you get a colonoscopy every 10 years.  Flexible sigmoidoscopy or colonoscopy.** / Every 5 years for a flexible sigmoidoscopy or every 10 years for a colonoscopy beginning at age 86 years and continuing until age 71 years.  Hepatitis C blood test.** / For all people born from [birth year] through 1963 and any individual with known risks for hepatitis C.  Osteoporosis screening.** / A one-time screening for women ages 83 years and over and women at risk for fractures or osteoporosis.  Skin self-exam. / Monthly.  Influenza vaccine. / Every year.  Tetanus, diphtheria, and acellular pertussis (Tdap/Td) vaccine.** / 1 dose of Td every 10 years.  Varicella vaccine.** / Consult your health care provider.  Zoster vaccine.** / 1 dose for adults aged 61 years or older.  Pneumococcal 13-valent conjugate (PCV13) vaccine.** / Consult your health care provider.  Pneumococcal  polysaccharide (PPSV23) vaccine.** / 1 dose for all adults aged 28 years and older.  Meningococcal vaccine.** / Consult your health care provider.  Hepatitis A vaccine.** / Consult your health care provider.  Hepatitis B vaccine.** / Consult your health care provider.  Haemophilus influenzae type b (Hib) vaccine.** / Consult your health care provider. ** Family history and personal history of risk and conditions may change your health care provider's recommendations. Document Released: 02/12/2002 Document Revised: 05/03/2014 Document Reviewed: 05/14/2011 [facility] Patient Information 2015 [place], [place]. This information is not intended to replace advice given to you by your health care provider. Make sure you discuss any questions you have with your health care provider.

## 2015-01-12 ENCOUNTER — Encounter: Payer: Self-pay | Admitting: Family Medicine

## 2015-01-12 DIAGNOSIS — L97511 Non-pressure chronic ulcer of other part of right foot limited to breakdown of skin: Secondary | ICD-10-CM | POA: Diagnosis not present

## 2015-01-13 ENCOUNTER — Telehealth: Payer: Self-pay | Admitting: Family Medicine

## 2015-01-13 ENCOUNTER — Other Ambulatory Visit: Payer: Self-pay | Admitting: Family Medicine

## 2015-01-13 DIAGNOSIS — I2510 Atherosclerotic heart disease of native coronary artery without angina pectoris: Secondary | ICD-10-CM

## 2015-01-13 DIAGNOSIS — D509 Iron deficiency anemia, unspecified: Secondary | ICD-10-CM

## 2015-01-13 DIAGNOSIS — E46 Unspecified protein-calorie malnutrition: Secondary | ICD-10-CM

## 2015-01-13 DIAGNOSIS — I2583 Coronary atherosclerosis due to lipid rich plaque: Principal | ICD-10-CM

## 2015-01-13 NOTE — Telephone Encounter (Signed)
Caller name: Tali, Cleaves Relationship to patient: emergency contact  Can be reached: [phone]   Reason for call: Pt requesting CPE lab orders. Pt will go to [facility] lab

## 2015-01-13 NOTE — Telephone Encounter (Signed)
Lab order in for elam

## 2015-01-13 NOTE — Telephone Encounter (Signed)
Please advise on Labs      KP

## 2015-01-15 ENCOUNTER — Other Ambulatory Visit: Payer: Self-pay | Admitting: Family Medicine

## 2015-01-17 ENCOUNTER — Ambulatory Visit (INDEPENDENT_AMBULATORY_CARE_PROVIDER_SITE_OTHER): Payer: Medicare Other | Admitting: Physician Assistant

## 2015-01-17 ENCOUNTER — Encounter: Payer: Self-pay | Admitting: Physician Assistant

## 2015-01-17 ENCOUNTER — Telehealth: Payer: Self-pay | Admitting: *Deleted

## 2015-01-17 ENCOUNTER — Ambulatory Visit (HOSPITAL_BASED_OUTPATIENT_CLINIC_OR_DEPARTMENT_OTHER)
Admission: RE | Admit: 2015-01-17 | Discharge: 2015-01-17 | Disposition: A | Discharge status: Home / Self Care | Payer: Medicare Other | Source: Ambulatory Visit | Attending: Physician Assistant | Admitting: Physician Assistant

## 2015-01-17 VITALS — BP 86/51 | HR 87 | Temp 97.8°F | Resp 14 | Ht <= 58 in | Wt 79.4 lb

## 2015-01-17 DIAGNOSIS — R296 Repeated falls: Secondary | ICD-10-CM

## 2015-01-17 DIAGNOSIS — Z9181 History of falling: Secondary | ICD-10-CM

## 2015-01-17 DIAGNOSIS — M5136 Other intervertebral disc degeneration, lumbar region: Secondary | ICD-10-CM | POA: Insufficient documentation

## 2015-01-17 DIAGNOSIS — W19XXXA Unspecified fall, initial encounter: Secondary | ICD-10-CM

## 2015-01-17 DIAGNOSIS — Y92099 Unspecified place in other non-institutional residence as the place of occurrence of the external cause: Secondary | ICD-10-CM

## 2015-01-17 DIAGNOSIS — I2510 Atherosclerotic heart disease of native coronary artery without angina pectoris: Secondary | ICD-10-CM

## 2015-01-17 DIAGNOSIS — R531 Weakness: Secondary | ICD-10-CM

## 2015-01-17 DIAGNOSIS — M4186 Other forms of scoliosis, lumbar region: Secondary | ICD-10-CM | POA: Diagnosis not present

## 2015-01-17 DIAGNOSIS — Y92009 Unspecified place in unspecified non-institutional (private) residence as the place of occurrence of the external cause: Secondary | ICD-10-CM

## 2015-01-17 DIAGNOSIS — R2681 Unsteadiness on feet: Secondary | ICD-10-CM

## 2015-01-17 DIAGNOSIS — M545 Low back pain: Secondary | ICD-10-CM | POA: Diagnosis present

## 2015-01-17 MED ORDER — HYDROCODONE-ACETAMINOPHEN 5-325 MG PO TABS: 1.0000 | ORAL_TABLET | Freq: Three times a day (TID) | ORAL | Status: DC | PRN

## 2015-01-17 NOTE — Telephone Encounter (Signed)
Spoke to patient's son who stated that mother fell once out of bed two weeks ago.  Fall was unwitnessed, but son heard the patient call for help over a monitor.  When son entered room, she was on the floor next to the bed on her bottom.  Son stated that he she denies hitting her head and she stated she was trying to put on her shoes and fell out of the bed.     Son reports increasing weaknesses in patient's legs.  He states that three months ago, she was walking independently with a walker.  He reports an incident of patient sitting on the toilet and then calling for help because she was unable to stand again, which is not normal for her.  She does not report numbness, but states that her feet and legs hurt all the time.  She denies shortness of breath or dizziness, and has not had any other behavioral changes other than the increased weakness.    Patient has a nasal cannula that her son reports she wears during sleep and in her chair.  He states that it falls of of her face, and reports using "rubber bands" to keep it on.  Son requests a wheelchair.

## 2015-01-17 NOTE — Patient Instructions (Signed)
Please go downstairs for imaging. I will call you with your results. Please continue Fentanyl patch as directed.  Use Norco for breakthrough pain.  Apply topical Aspercreme to the area. No heavy lifting!  I will work on the wheelchair and the Home Health Referral.  Follow-up in 2 weeks.

## 2015-01-17 NOTE — Progress Notes (Signed)
Pre visit review using our clinic review tool, if applicable. No additional management support is needed unless otherwise documented below in the visit note/SLS  

## 2015-01-17 NOTE — Progress Notes (Signed)
Patient presents to clinic today c/o bilateral low back pain after sliding out of bed onto her buttocks 2 weeks ago.  Denies lightheadedness, dizziness at time of fall.  States she has been unable to ambulate well with her walker since fall.  Family requesting referral for home health to help patient with ADLs and with strengthening exercises.  Patient has been having difficulty with bilateral lower extremity weakness over the past year but more prominent over the past few months.  They are also requesting a wheelchair to help them get their mother in an out of house and to help them when they are out running errands.  Past Medical History  Diagnosis Date  . GERD (gastroesophageal reflux disease)   . Bladder incontinence   . Neuropathic pain   . RLS (restless legs syndrome)   . Arthritis   . LBBB (left bundle branch block)     Current Outpatient Prescriptions on File Prior to Visit  Medication Sig Dispense Refill  . albuterol (PROVENTIL HFA;VENTOLIN HFA) 108 (90 BASE) MCG/ACT inhaler Inhale 2 puffs into the lungs every 6 (six) hours as needed for wheezing. 1 Inhaler 0  . Calcium Carb-Cholecalciferol (CALCIUM 600 + D PO) Take 1 tablet by mouth 2 (two) times daily.    . Cholecalciferol (VITAMIN D3) 5000 UNITS CAPS Take 1 capsule by mouth daily.    [name] Calcium (STOOL SOFTENER PO) Take 2 tablets by mouth 2 (two) times daily.    . fentaNYL (DURAGESIC - DOSED MCG/HR) 12 MCG/HR Place 1 patch (12.5 mcg total) onto the skin every 3 (three) days. 10 patch 0  . gabapentin (NEURONTIN) 100 MG capsule Take 2 capsules by mouth in the morning, 2 caps at 2pm  and 1 cap at bedtime 450 capsule 1  . L-Methylfolate-B6-B12 (FOLTANX) 3-35-2 MG TABS Take 1 tablet by mouth 2 (two) times daily. TAKES IN AM AND AT BEDTIME    . magnesium hydroxide (MILK OF MAGNESIA) 400 MG/5ML suspension Take by mouth daily as needed for mild constipation.    . Multiple Vitamin (MULTIVITAMIN WITH MINERALS) TABS Take 1 tablet  by mouth every morning.     . Omega-3 Fatty Acids (FISH OIL) 1000 MG CPDR Take 2,000 mg by mouth daily with lunch.     Marland Kitchen omeprazole (PRILOSEC) 40 MG capsule Take 1 capsule (40 mg total) by mouth at bedtime. 90 capsule 3  . oxybutynin (DITROPAN-XL) 10 MG 24 hr tablet Take 1 tablet (10 mg total) by mouth daily. (Patient taking differently: Take 10 mg by mouth every morning. ) 90 tablet 3  . polyethylene glycol powder (GLYCOLAX/MIRALAX) powder Take 17 g by mouth daily as needed for moderate constipation.     . pramipexole (MIRAPEX) 0.5 MG tablet Take 1 tablet by mouth at  bedtime 90 tablet 3  . zoster vaccine live, PF, (ZOSTAVAX) 65465 UNT/0.65ML injection Inject 19,400 Units into the skin once. 1 vial 0   No current facility-administered medications on file prior to visit.    No Known Allergies  No family history on file.  History   Social History  . Marital Status: Widowed    Spouse Name: N/A    Number of Children: N/A  . Years of Education: N/A   Social History Main Topics  . Smoking status: Never Smoker   . Smokeless tobacco: Never Used  . Alcohol Use: No  . Drug Use: No  . Sexual Activity: None   Other Topics Concern  . None   Social History  Narrative   Review of Systems - See HPI.  All other ROS are negative.  BP 86/51 mmHg  Pulse 87  Temp(Src) 97.8 F (36.6 C) (Oral)  Resp 14  Ht $R'4\' 10"'iR$  (1.473 m)  Wt 79 lb 6 oz (36.004 kg)  BMI 16.59 kg/m2  SpO2 98%  Physical Exam  Constitutional: She is well-developed, well-nourished, and in no distress.  HENT:  Head: Normocephalic and atraumatic.  Eyes: Conjunctivae are normal. Pupils are equal, round, and reactive to light.  Neck: Neck supple.  Cardiovascular: Normal rate, regular rhythm, normal heart sounds and intact distal pulses.   Pulmonary/Chest: Effort normal and breath sounds normal. No respiratory distress. She has no wheezes. She has no rales. She exhibits no tenderness.  Musculoskeletal:       Right hip: She  exhibits decreased strength. She exhibits normal range of motion, no tenderness and no bony tenderness.       Left hip: She exhibits decreased strength. She exhibits normal range of motion, no tenderness and no bony tenderness.       Lumbar back: She exhibits tenderness and pain. She exhibits no bony tenderness and no swelling.  Neurological: She is alert. She displays weakness. She displays facial symmetry and normal reflexes. She exhibits normal muscle tone. Gait abnormal. Coordination normal.  Patient unable to raise from chair.  Skin: Skin is warm and dry. No rash noted.  Psychiatric: Affect normal.  Vitals reviewed.  Recent Results (from the past 2160 hour(s))  CBC     Status: None   Collection Time: 11/04/14  1:05 PM  Result Value Ref Range   WBC 6.7 4.0 - 10.5 K/uL   RBC 4.13 3.87 - 5.11 MIL/uL   Hemoglobin 12.7 12.0 - 15.0 g/dL   HCT 38.0 36.0 - 46.0 %   MCV 92.0 78.0 - 100.0 fL   MCH 30.8 26.0 - 34.0 pg   MCHC 33.4 30.0 - 36.0 g/dL   RDW 13.5 11.5 - 15.5 %   Platelets 193 150 - 400 K/uL  D-dimer, quantitative     Status: Abnormal   Collection Time: 11/04/14  1:05 PM  Result Value Ref Range   D-Dimer, Quant 1.37 (H) 0.00 - 0.48 ug/mL-FEU    Comment:        AT THE INHOUSE ESTABLISHED CUTOFF VALUE OF 0.48 ug/mL FEU, THIS ASSAY HAS BEEN DOCUMENTED IN THE LITERATURE TO HAVE A SENSITIVITY AND NEGATIVE PREDICTIVE VALUE OF AT LEAST 98 TO 99%.  THE TEST RESULT SHOULD BE CORRELATED WITH AN ASSESSMENT OF THE CLINICAL PROBABILITY OF DVT / VTE.   I-stat troponin, ED (not at [facility])     Status: None   Collection Time: 11/04/14  1:14 PM  Result Value Ref Range   Troponin i, poc 0.00 0.00 - 0.08 ng/mL   Comment 3            Comment: Due to the release kinetics of cTnI, a negative result within the first hours of the onset of symptoms does not rule out myocardial infarction with certainty. If myocardial infarction is still suspected, repeat the test at appropriate intervals.     Basic metabolic panel     Status: Abnormal   Collection Time: 11/04/14  2:34 PM  Result Value Ref Range   Sodium 143 137 - 147 mEq/L   Potassium 3.8 3.7 - 5.3 mEq/L   Chloride 102 96 - 112 mEq/L   CO2 30 19 - 32 mEq/L   Glucose, Bld 111 (H) 70 - 99  mg/dL   BUN 22 6 - 23 mg/dL   Creatinine, Ser 0.55 0.50 - 1.10 mg/dL   Calcium 9.5 8.4 - 10.5 mg/dL   GFR calc non Af Amer 78 (L) >90 mL/min   GFR calc Af Amer >90 >90 mL/min    Comment: (NOTE) The eGFR has been calculated using the CKD EPI equation. This calculation has not been validated in all clinical situations. eGFR's persistently <90 mL/min signify possible Chronic Kidney Disease.    Anion gap 11 5 - 15  CBC     Status: None   Collection Time: 11/04/14 10:02 PM  Result Value Ref Range   WBC 5.4 4.0 - 10.5 K/uL   RBC 4.15 3.87 - 5.11 MIL/uL   Hemoglobin 12.8 12.0 - 15.0 g/dL   HCT 38.2 36.0 - 46.0 %   MCV 92.0 78.0 - 100.0 fL   MCH 30.8 26.0 - 34.0 pg   MCHC 33.5 30.0 - 36.0 g/dL   RDW 13.6 11.5 - 15.5 %   Platelets 205 150 - 400 K/uL  Creatinine, serum     Status: Abnormal   Collection Time: 11/04/14 10:02 PM  Result Value Ref Range   Creatinine, Ser 0.52 0.50 - 1.10 mg/dL   GFR calc non Af Amer 80 (L) >90 mL/min   GFR calc Af Amer >90 >90 mL/min    Comment: (NOTE) The eGFR has been calculated using the CKD EPI equation. This calculation has not been validated in all clinical situations. eGFR's persistently <90 mL/min signify possible Chronic Kidney Disease.   Troponin I (q 6hr x 3)     Status: None   Collection Time: 11/04/14 10:02 PM  Result Value Ref Range   Troponin I <0.30 <0.30 ng/mL    Comment:        Due to the release kinetics of cTnI, a negative result within the first hours of the onset of symptoms does not rule out myocardial infarction with certainty. If myocardial infarction is still suspected, repeat the test at appropriate intervals.   Troponin I (q 6hr x 3)     Status: None   Collection  Time: 11/05/14  2:50 AM  Result Value Ref Range   Troponin I <0.30 <0.30 ng/mL    Comment:        Due to the release kinetics of cTnI, a negative result within the first hours of the onset of symptoms does not rule out myocardial infarction with certainty. If myocardial infarction is still suspected, repeat the test at appropriate intervals.   Troponin I     Status: None   Collection Time: 11/05/14  8:59 AM  Result Value Ref Range   Troponin I <0.30 <0.30 ng/mL    Comment:        Due to the release kinetics of cTnI, a negative result within the first hours of the onset of symptoms does not rule out myocardial infarction with certainty. If myocardial infarction is still suspected, repeat the test at appropriate intervals.     Assessment/Plan: Fall at home No history of head trauma. Patient are ready at high risk of falls due to lower extremity weakness and gait instability. We'll obtain x-ray of lumbar spine to rule out fracture as patient is at high risk for vertebral fracture. Patient to continue fentanyl. Has been receiving oxycodone from her daughters. Was noted to be hypotensive. And instructed daughters to stop giving patient medication that is not intended for her as this can cause side effects and can increase  her risk of fall due to making her slightly hypertensive. Apply topical Aspercreme to the affected area.   At high risk for falls Order placed for manual wheelchair as patient at high risk for falls and significant weakness noted on examination. Also, referral placed to home health for home health aide, physical therapy and medical social work.

## 2015-01-17 NOTE — Telephone Encounter (Signed)
She does need to be seen ----r/o back issue,  May need home health for pt, ot

## 2015-01-18 ENCOUNTER — Telehealth: Payer: Self-pay | Admitting: Family Medicine

## 2015-01-18 NOTE — Telephone Encounter (Signed)
Caller name: Oettinger,[name] Relation to pt: self  Call back number: [phone]   Reason for call: pt inquiring about x ray results and Home Health Referral.

## 2015-01-18 NOTE — Telephone Encounter (Signed)
Pt's son was notified per xray result note and he has been advised that home health referral was placed.

## 2015-01-19 DIAGNOSIS — L97511 Non-pressure chronic ulcer of other part of right foot limited to breakdown of skin: Secondary | ICD-10-CM | POA: Diagnosis not present

## 2015-01-23 DIAGNOSIS — Y92009 Unspecified place in unspecified non-institutional (private) residence as the place of occurrence of the external cause: Secondary | ICD-10-CM | POA: Insufficient documentation

## 2015-01-23 DIAGNOSIS — Z9181 History of falling: Secondary | ICD-10-CM | POA: Insufficient documentation

## 2015-01-23 DIAGNOSIS — W19XXXA Unspecified fall, initial encounter: Secondary | ICD-10-CM | POA: Insufficient documentation

## 2015-01-23 NOTE — Assessment & Plan Note (Signed)
Order placed for manual wheelchair as patient at high risk for falls and significant weakness noted on examination. Also, referral placed to home health for home health aide, physical therapy and medical social work.

## 2015-01-23 NOTE — Assessment & Plan Note (Signed)
No history of head trauma. Patient are ready at high risk of falls due to lower extremity weakness and gait instability. We'll obtain x-ray of lumbar spine to rule out fracture as patient is at high risk for vertebral fracture. Patient to continue fentanyl. Has been receiving oxycodone from her daughters. Was noted to be hypotensive. And instructed daughters to stop giving patient medication that is not intended for her as this can cause side effects and can increase her risk of fall due to making her slightly hypertensive. Apply topical Aspercreme to the affected area.

## 2015-01-26 DIAGNOSIS — L97511 Non-pressure chronic ulcer of other part of right foot limited to breakdown of skin: Secondary | ICD-10-CM | POA: Diagnosis not present

## 2015-01-27 NOTE — Progress Notes (Signed)
[facility]  NAME:  Kerry Myers, Kerry Myers NO.:  MEDICAL RECORD NO.:  [id number]      DATE OF BIRTH:  1919-10-21  PHYSICIAN:  Christin Fudge, MD        VISIT DATE:  01/26/2015                                  OFFICE VISIT   OFFICE VISIT NOTE:  This pleasant 81 year old patient comes with a history of having an ulcerated area on the right dorsum of the foot.  This was probably caused by trauma.  SUBJECTIVE:  The patient says she is feeling fine and has no issues with her leg and seems to be healing well.  OBJECTIVE:  On clinical examination, vital signs are stable.  She is afebrile and she is in no acute distress.  She has minimal eschar on the ulceration in the dorsum of her foot, and there is no inflammation.  The ulcer size is noted in the nurses notes.  Kindly refer to them for further details.  FINDINGS:  Clean ulcer base with hypergranulation.  Silver nitrite cauterization done.  ASSESSMENT AND PLAN:  The wound looks excellent and is healing well.  We will continue with a collagen dressing and border on alternate days.  We will see her back next week.          ______________________________ Christin Fudge, MD     EB/MEDQ  D:  01/26/2015  T:  01/27/2015  Job:  [id number]

## 2015-01-31 ENCOUNTER — Ambulatory Visit (INDEPENDENT_AMBULATORY_CARE_PROVIDER_SITE_OTHER): Payer: Medicare Other | Admitting: Family Medicine

## 2015-01-31 ENCOUNTER — Ambulatory Visit (HOSPITAL_BASED_OUTPATIENT_CLINIC_OR_DEPARTMENT_OTHER)
Admission: RE | Admit: 2015-01-31 | Discharge: 2015-01-31 | Disposition: A | Discharge status: Home / Self Care | Payer: Medicare Other | Source: Ambulatory Visit | Attending: Family Medicine | Admitting: Family Medicine

## 2015-01-31 ENCOUNTER — Other Ambulatory Visit: Payer: Self-pay | Admitting: Family Medicine

## 2015-01-31 ENCOUNTER — Telehealth: Payer: Self-pay

## 2015-01-31 ENCOUNTER — Encounter: Payer: Self-pay | Admitting: Family Medicine

## 2015-01-31 ENCOUNTER — Telehealth: Payer: Self-pay | Admitting: Family Medicine

## 2015-01-31 VITALS — BP 122/59 | HR 66 | Temp 98.3°F | Wt 79.0 lb

## 2015-01-31 DIAGNOSIS — T402X5A Adverse effect of other opioids, initial encounter: Secondary | ICD-10-CM

## 2015-01-31 DIAGNOSIS — K5903 Drug induced constipation: Secondary | ICD-10-CM

## 2015-01-31 DIAGNOSIS — M25552 Pain in left hip: Secondary | ICD-10-CM | POA: Diagnosis present

## 2015-01-31 DIAGNOSIS — I2510 Atherosclerotic heart disease of native coronary artery without angina pectoris: Secondary | ICD-10-CM

## 2015-01-31 DIAGNOSIS — M1612 Unilateral primary osteoarthritis, left hip: Secondary | ICD-10-CM | POA: Diagnosis not present

## 2015-01-31 DIAGNOSIS — K5909 Other constipation: Secondary | ICD-10-CM

## 2015-01-31 DIAGNOSIS — W06XXXA Fall from bed, initial encounter: Secondary | ICD-10-CM | POA: Diagnosis not present

## 2015-01-31 DIAGNOSIS — E46 Unspecified protein-calorie malnutrition: Secondary | ICD-10-CM

## 2015-01-31 DIAGNOSIS — L89101 Pressure ulcer of unspecified part of back, stage 1: Secondary | ICD-10-CM

## 2015-01-31 DIAGNOSIS — S300XXA Contusion of lower back and pelvis, initial encounter: Secondary | ICD-10-CM

## 2015-01-31 DIAGNOSIS — M533 Sacrococcygeal disorders, not elsewhere classified: Secondary | ICD-10-CM | POA: Insufficient documentation

## 2015-01-31 DIAGNOSIS — D509 Iron deficiency anemia, unspecified: Secondary | ICD-10-CM

## 2015-01-31 DIAGNOSIS — I2583 Coronary atherosclerosis due to lipid rich plaque: Secondary | ICD-10-CM

## 2015-01-31 LAB — BASIC METABOLIC PANEL
BUN: 16 mg/dL (ref 6–23)
CHLORIDE: 100 meq/L (ref 96–112)
CO2: 35 mEq/L — ABNORMAL HIGH (ref 19–32)
Calcium: 9.3 mg/dL (ref 8.4–10.5)
Creatinine, Ser: 0.46 mg/dL (ref 0.40–1.20)
GFR: 134.62 mL/min (ref >60.00)
GLUCOSE: 80 mg/dL (ref 70–99)
POTASSIUM: 4.4 meq/L (ref 3.5–5.1)
SODIUM: 138 meq/L (ref 135–145)

## 2015-01-31 LAB — CBC WITH DIFFERENTIAL/PLATELET
Basophils Absolute: 0 10*3/uL (ref 0.0–0.1)
Basophils Relative: 0.4 % (ref 0.0–3.0)
EOS PCT: 0.7 % (ref 0.0–5.0)
Eosinophils Absolute: 0 10*3/uL (ref 0.0–0.7)
HCT: 34.7 % — ABNORMAL LOW (ref 36.0–46.0)
Hemoglobin: 11.5 g/dL — ABNORMAL LOW (ref 12.0–15.0)
Lymphocytes Relative: 22.4 % (ref 12.0–46.0)
Lymphs Abs: 1.6 10*3/uL (ref 0.7–4.0)
MCHC: 33.2 g/dL (ref 30.0–36.0)
MCV: 91.2 fl (ref 78.0–100.0)
MONOS PCT: 7.2 % (ref 3.0–12.0)
Monocytes Absolute: 0.5 10*3/uL (ref 0.1–1.0)
Neutro Abs: 4.8 10*3/uL (ref 1.4–7.7)
Neutrophils Relative %: 69.3 % (ref 43.0–77.0)
PLATELETS: 390 10*3/uL (ref 150.0–400.0)
RBC: 3.8 Mil/uL — AB (ref 3.87–5.11)
RDW: 14.1 % (ref 11.5–15.5)
WBC: 6.9 10*3/uL (ref 4.0–10.5)

## 2015-01-31 LAB — HEPATIC FUNCTION PANEL
ALT: 15 U/L (ref 0–35)
AST: 24 U/L (ref 0–37)
Albumin: 3.4 g/dL — ABNORMAL LOW (ref 3.5–5.2)
Alkaline Phosphatase: 236 U/L — ABNORMAL HIGH (ref 39–117)
Bilirubin, Direct: 0 mg/dL (ref 0.0–0.3)
Total Bilirubin: 0.3 mg/dL (ref 0.2–1.2)
Total Protein: 6.6 g/dL (ref 6.0–8.3)

## 2015-01-31 LAB — LIPID PANEL
CHOLESTEROL: 208 mg/dL — AB (ref 0–200)
HDL: 53.5 mg/dL (ref >39.00)
LDL Cholesterol: 135 mg/dL — ABNORMAL HIGH (ref 0–99)
NONHDL: 154.5
TRIGLYCERIDES: 97 mg/dL (ref 0.0–149.0)
Total CHOL/HDL Ratio: 4
VLDL: 19.4 mg/dL (ref 0.0–40.0)

## 2015-01-31 NOTE — Assessment & Plan Note (Signed)
Increase po fluids,  Inc fiber con't stool softener Ok to take MOM every other day for short period of time

## 2015-01-31 NOTE — Telephone Encounter (Signed)
-----   Message from Darlina Guys sent at 01/31/2015  4:42 PM EST ----- Thanks! Can you print the order and let Dr. Etter Sjogren co-sign it then fax it in to [phone]? I'll pull everything else from Epic. ----- Message -----    From: Ewing Schlein, CMA    Sent: 01/31/2015  12:05 PM      To: Melissa Stenson   Dr.[name] is the PCP and will sign off on the orders for this patient, she is requesting would care.     KP ----- Message -----    From: Rosalita Chessman, DO    Sent: 01/31/2015  11:56 AM      To: Ewing Schlein, CMA  Pt needs wound care nurse to come out to house-- cody put home health referral in last week

## 2015-01-31 NOTE — Progress Notes (Signed)
Subjective:    Patient ID: Kerry Myers, female    DOB: 09-Dec-1919, 81 y.o.   MRN: [id number]  Fall    Patient here for f/u fall and visit with [facility].  Pt still c/o L hip and tail bone pain.  Caretaker has also noticed a sore on her back.  She is also c/o constipation.  The miralax is not working --- MOM works well.    Past Medical History  Diagnosis Date  . GERD (gastroesophageal reflux disease)   . Bladder incontinence   . Neuropathic pain   . RLS (restless legs syndrome)   . Arthritis   . LBBB (left bundle branch block)     Review of Systems  Constitutional: Positive for fatigue. Negative for activity change and appetite change.  Respiratory: Negative for cough and shortness of breath.   Cardiovascular: Negative for chest pain, palpitations and leg swelling.  Musculoskeletal: Positive for myalgias, back pain, arthralgias and gait problem.       L hip and tail bone pain---- difficult to get good hx  Psychiatric/Behavioral: Negative for behavioral problems and dysphoric mood. The patient is not nervous/anxious.        Objective:    Physical Exam  Constitutional: She is oriented to person, place, and time. Vital signs are normal. She appears cachectic. No distress.  HENT:  Right Ear: External ear normal.  Left Ear: External ear normal.  Nose: Nose normal.  Mouth/Throat: Oropharynx is clear and moist.  Eyes: EOM are normal. Pupils are equal, round, and reactive to light.  Neck: Normal range of motion. Neck supple.  Cardiovascular: Normal rate, regular rhythm and normal heart sounds.   No murmur heard. Pulmonary/Chest: Effort normal and breath sounds normal. No respiratory distress. She has no wheezes. She has no rales. She exhibits no tenderness.  Musculoskeletal: She exhibits tenderness.  + L hip pain and pain in coccyx   Neurological: She is alert and oriented to person, place, and time.  Skin:  + decubitus ulcer mid thoracic spine-- stage 1  Psychiatric: She has  a normal mood and affect. Her behavior is normal. Judgment and thought content normal.    BP 122/59 mmHg  Pulse 66  Temp(Src) 98.3 F (36.8 C) (Oral)  Wt 79 lb (35.834 kg)  SpO2 100% Wt Readings from Last 3 Encounters:  01/31/15 79 lb (35.834 kg)  01/17/15 79 lb 6 oz (36.004 kg)  01/11/15 79 lb 9.6 oz (36.106 kg)     Lab Results  Component Value Date   WBC 5.4 11/04/2014   HGB 12.8 11/04/2014   HCT 38.2 11/04/2014   PLT 205 11/04/2014   GLUCOSE 111* 11/04/2014   CHOL 184 12/05/2013   TRIG 52 12/05/2013   HDL 60 12/05/2013   LDLDIRECT 159.4 10/24/2006   LDLCALC 114* 12/05/2013   ALT 9 12/04/2013   AST 17 12/04/2013   NA 143 11/04/2014   K 3.8 11/04/2014   CL 102 11/04/2014   CREATININE 0.52 11/04/2014   BUN 22 11/04/2014   CO2 30 11/04/2014   TSH 0.711 12/04/2013   HGBA1C 5.6 12/04/2013    No results found.     Assessment & Plan:   Problem List Items Addressed This Visit    Constipation due to opioid therapy    Increase po fluids,  Inc fiber con't stool softener Ok to take MOM every other day for short period of time       Other Visit Diagnoses    Hip pain, acute,  left    -  Primary    Relevant Orders    DG HIP UNILAT WITH PELVIS 2-3 VIEWS LEFT    Coccyx contusion, initial encounter        Relevant Orders    DG Sacrum/Coccyx    Coronary artery disease due to lipid rich plaque        Protein-calorie malnutrition        Anemia, iron deficiency            Garnet Koyanagi, DO

## 2015-01-31 NOTE — Telephone Encounter (Signed)
Order placed, signed and faxed.      KP

## 2015-01-31 NOTE — Telephone Encounter (Signed)
Please print the results from her physical and give them to her today when she Sees Cody this morning.  She had bowel surgery in 2011 She was to take Metamucil ever since.  She was switched to Miralax and she has had 2 bad bouts of constipation.

## 2015-01-31 NOTE — Patient Instructions (Signed)

## 2015-01-31 NOTE — Progress Notes (Signed)
Pre visit review using our clinic review tool, if applicable. No additional management support is needed unless otherwise documented below in the visit note. 

## 2015-02-02 ENCOUNTER — Other Ambulatory Visit (INDEPENDENT_AMBULATORY_CARE_PROVIDER_SITE_OTHER): Payer: Medicare Other

## 2015-02-02 ENCOUNTER — Telehealth: Payer: Self-pay | Admitting: *Deleted

## 2015-02-02 ENCOUNTER — Encounter (HOSPITAL_BASED_OUTPATIENT_CLINIC_OR_DEPARTMENT_OTHER): Payer: Medicare Other | Attending: Surgery

## 2015-02-02 DIAGNOSIS — L89891 Pressure ulcer of other site, stage 1: Secondary | ICD-10-CM | POA: Diagnosis not present

## 2015-02-02 DIAGNOSIS — L97519 Non-pressure chronic ulcer of other part of right foot with unspecified severity: Secondary | ICD-10-CM | POA: Insufficient documentation

## 2015-02-02 DIAGNOSIS — R748 Abnormal levels of other serum enzymes: Secondary | ICD-10-CM

## 2015-02-02 LAB — GAMMA GT: GGT: 13 U/L (ref 7–51)

## 2015-02-02 NOTE — Telephone Encounter (Signed)
Received home health certification and plan of care via fax from [facility]. Forwarded to Dr. [name]. JG//CMA 

## 2015-02-03 ENCOUNTER — Telehealth: Payer: Self-pay

## 2015-02-03 MED ORDER — TRAMADOL HCL 50 MG PO TABS: 50.0000 mg | ORAL_TABLET | Freq: Two times a day (BID) | ORAL | Status: DC | PRN

## 2015-02-03 NOTE — H&P (Signed)
Kerry Myers, [name]:  [id number]  MEDICAL RECORD NO.:  [id number]  LOCATION:  FOOT                         FACILITY:  [facility]  PHYSICIAN:  Christin Fudge, MD       DATE OF BIRTH:  26-Jul-1919  DATE OF ADMISSION:  02/02/2015 DATE OF DISCHARGE:                             HISTORY & PHYSICAL   This pleasant 81 year old patient who had an ulcerated area on the right foot dorsum was getting much better from this and feels that this place has healed.  She comes with a fresh problem on her spine because of her kyphosis. She has an area where pressure has been applied over a period of time and she feels she may be having a small ulcer.  Subjectively, the patient is fine and does not seem to be in any distress.  OBJECTIVE:  The patient on clinical examination is awake and alert. Vital signs are stable and she is afebrile and in no acute distress. The right foot ulceration is completely healed, and this wound has been healed out.  To the lateral part of her right foot on the lower 1/3 of her leg, she has some fungal infection, and we have applied appropriate antifungal ointment.  On examination of her spinal area, she has got significant kyphosis to the left of the midline in the paraspinal region of the thoracic spine. She has got a linear area where there is a stage I pressure ulcer.  This is approximately 3 cm in length and 0.5 cm in breadth.  Other than that, there is no open area and there is no depth to it.  The rest of the skin is normal.  ASSESSMENT AND PLAN:  The left paraspinal stage I pressure ulcer is to be treated with a Mepilex border, and I have recommended foam cushioning in that area.  I have also recommended frequent changes in her sleep pattern so she can change sides every 2 hours.  The family members will work with the patient on this.  As far as the right foot goes it is healed, and I have advised her no dressing at the present time but to  wash with soap and water and then apply an antifungal ointment to the right lower extremity.  She is discharged from the wound care and can be seen back on an as needed basis.  All questions have been answered, and the family members are agreeable with the treatment plan.          ______________________________ Christin Fudge, MD     EB/MEDQ  D:  02/02/2015  T:  02/03/2015  Job:  [id number]

## 2015-02-03 NOTE — Telephone Encounter (Signed)
Refill x1   2 refills 

## 2015-02-03 NOTE — Telephone Encounter (Signed)
Tramadol refill requested Last seen 01/31/15 and  Last filled 9/11/5 #180   Please advise     KP

## 2015-02-04 ENCOUNTER — Other Ambulatory Visit: Payer: Self-pay

## 2015-02-04 DIAGNOSIS — R748 Abnormal levels of other serum enzymes: Secondary | ICD-10-CM

## 2015-02-08 ENCOUNTER — Telehealth: Payer: Self-pay | Admitting: Family Medicine

## 2015-02-08 DIAGNOSIS — G894 Chronic pain syndrome: Secondary | ICD-10-CM

## 2015-02-08 NOTE — Telephone Encounter (Signed)
Refill x1 

## 2015-02-08 NOTE — Telephone Encounter (Signed)
Closed in error.

## 2015-02-08 NOTE — Telephone Encounter (Signed)
Caller name: fred Relation to pt: spouse Call back number: [phone] Pharmacy:  Reason for call:   Requesting a new fentanyl rx.

## 2015-02-08 NOTE — Telephone Encounter (Signed)
Last seen 01/31/15 and filled 01/03/15 #10   Please advise     KP

## 2015-02-09 MED ORDER — FENTANYL 12 MCG/HR TD PT72: 12.5000 ug | MEDICATED_PATCH | TRANSDERMAL | Status: DC

## 2015-02-09 NOTE — Telephone Encounter (Signed)
[name] aware the Rx will be ready to pick up in the morning.     KP

## 2015-02-09 NOTE — Addendum Note (Signed)
Addended by: Ewing Schlein on: 02/09/2015 08:48 AM   Modules accepted: Orders

## 2015-02-18 ENCOUNTER — Other Ambulatory Visit (INDEPENDENT_AMBULATORY_CARE_PROVIDER_SITE_OTHER): Payer: Medicare Other

## 2015-02-18 DIAGNOSIS — R748 Abnormal levels of other serum enzymes: Secondary | ICD-10-CM

## 2015-02-18 LAB — GAMMA GT: GGT: 16 U/L (ref 7–51)

## 2015-02-18 LAB — HEPATIC FUNCTION PANEL
ALT: 13 U/L (ref 0–35)
AST: 23 U/L (ref 0–37)
Albumin: 3.7 g/dL (ref 3.5–5.2)
Alkaline Phosphatase: 153 U/L — ABNORMAL HIGH (ref 39–117)
BILIRUBIN TOTAL: 0.4 mg/dL (ref 0.2–1.2)
Bilirubin, Direct: 0.1 mg/dL (ref 0.0–0.3)
TOTAL PROTEIN: 7.3 g/dL (ref 6.0–8.3)

## 2015-02-23 ENCOUNTER — Other Ambulatory Visit: Payer: Medicare Other

## 2015-02-23 DIAGNOSIS — R748 Abnormal levels of other serum enzymes: Secondary | ICD-10-CM

## 2015-02-24 ENCOUNTER — Other Ambulatory Visit: Payer: Medicare Other

## 2015-02-24 DIAGNOSIS — E559 Vitamin D deficiency, unspecified: Secondary | ICD-10-CM

## 2015-02-25 ENCOUNTER — Other Ambulatory Visit (INDEPENDENT_AMBULATORY_CARE_PROVIDER_SITE_OTHER): Payer: Medicare Other

## 2015-02-25 ENCOUNTER — Other Ambulatory Visit: Payer: Medicare Other

## 2015-02-25 ENCOUNTER — Other Ambulatory Visit: Payer: Self-pay | Admitting: *Deleted

## 2015-02-25 DIAGNOSIS — E559 Vitamin D deficiency, unspecified: Secondary | ICD-10-CM

## 2015-02-25 DIAGNOSIS — R748 Abnormal levels of other serum enzymes: Secondary | ICD-10-CM

## 2015-02-25 LAB — VITAMIN D 25 HYDROXY (VIT D DEFICIENCY, FRACTURES): VITD: 60.87 ng/mL (ref 30.00–100.00)

## 2015-03-14 ENCOUNTER — Telehealth: Payer: Self-pay | Admitting: Family Medicine

## 2015-03-14 DIAGNOSIS — G894 Chronic pain syndrome: Secondary | ICD-10-CM

## 2015-03-14 MED ORDER — FENTANYL 12 MCG/HR TD PT72: 12.5000 ug | MEDICATED_PATCH | TRANSDERMAL | Status: DC

## 2015-03-14 NOTE — Telephone Encounter (Signed)
Kerry Myers has been made aware that the Rx is ready for pick up.        KP

## 2015-03-14 NOTE — Telephone Encounter (Signed)
Last seen 01/31/15 and filled 02/09/15 #10 UDS 01/31/15 low risk.   Please advise       KP

## 2015-03-14 NOTE — Telephone Encounter (Signed)
Caller name: mr. Woznick Relation to pt: husband Call back number: [phone] Pharmacy:  Reason for call:   Requesting a new fentaNYL rx for patient

## 2015-03-14 NOTE — Telephone Encounter (Signed)
Refill x1 

## 2015-03-28 ENCOUNTER — Other Ambulatory Visit: Payer: Self-pay | Admitting: Family Medicine

## 2015-04-08 ENCOUNTER — Ambulatory Visit: Payer: Medicare Other | Admitting: Family Medicine

## 2015-04-10 ENCOUNTER — Inpatient Hospital Stay (HOSPITAL_COMMUNITY)
Admission: EM | Admit: 2015-04-10 | Discharge: 2015-04-13 | DRG: 193 | Disposition: A | Discharge status: Home / Self Care | Payer: Medicare Other | Attending: Family Medicine | Admitting: Family Medicine

## 2015-04-10 ENCOUNTER — Encounter (HOSPITAL_COMMUNITY): Payer: Self-pay | Admitting: Vascular Surgery

## 2015-04-10 ENCOUNTER — Emergency Department (HOSPITAL_COMMUNITY): Payer: Medicare Other

## 2015-04-10 ENCOUNTER — Inpatient Hospital Stay (HOSPITAL_COMMUNITY): Payer: Medicare Other

## 2015-04-10 DIAGNOSIS — R0602 Shortness of breath: Secondary | ICD-10-CM | POA: Diagnosis present

## 2015-04-10 DIAGNOSIS — M4120 Other idiopathic scoliosis, site unspecified: Secondary | ICD-10-CM | POA: Diagnosis present

## 2015-04-10 DIAGNOSIS — Z9981 Dependence on supplemental oxygen: Secondary | ICD-10-CM

## 2015-04-10 DIAGNOSIS — J9691 Respiratory failure, unspecified with hypoxia: Secondary | ICD-10-CM | POA: Diagnosis present

## 2015-04-10 DIAGNOSIS — R42 Dizziness and giddiness: Secondary | ICD-10-CM

## 2015-04-10 DIAGNOSIS — H5704 Mydriasis: Secondary | ICD-10-CM

## 2015-04-10 DIAGNOSIS — T402X5A Adverse effect of other opioids, initial encounter: Secondary | ICD-10-CM | POA: Diagnosis present

## 2015-04-10 DIAGNOSIS — M419 Scoliosis, unspecified: Secondary | ICD-10-CM | POA: Diagnosis present

## 2015-04-10 DIAGNOSIS — L97509 Non-pressure chronic ulcer of other part of unspecified foot with unspecified severity: Secondary | ICD-10-CM

## 2015-04-10 DIAGNOSIS — K5909 Other constipation: Secondary | ICD-10-CM | POA: Diagnosis present

## 2015-04-10 DIAGNOSIS — Z89421 Acquired absence of other right toe(s): Secondary | ICD-10-CM

## 2015-04-10 DIAGNOSIS — G8929 Other chronic pain: Secondary | ICD-10-CM | POA: Diagnosis present

## 2015-04-10 DIAGNOSIS — Z681 Body mass index (BMI) 19 or less, adult: Secondary | ICD-10-CM

## 2015-04-10 DIAGNOSIS — K219 Gastro-esophageal reflux disease without esophagitis: Secondary | ICD-10-CM | POA: Diagnosis present

## 2015-04-10 DIAGNOSIS — Z66 Do not resuscitate: Secondary | ICD-10-CM | POA: Diagnosis present

## 2015-04-10 DIAGNOSIS — K5903 Drug induced constipation: Secondary | ICD-10-CM | POA: Diagnosis present

## 2015-04-10 DIAGNOSIS — J9620 Acute and chronic respiratory failure, unspecified whether with hypoxia or hypercapnia: Secondary | ICD-10-CM | POA: Diagnosis present

## 2015-04-10 DIAGNOSIS — J9621 Acute and chronic respiratory failure with hypoxia: Secondary | ICD-10-CM

## 2015-04-10 DIAGNOSIS — M810 Age-related osteoporosis without current pathological fracture: Secondary | ICD-10-CM | POA: Diagnosis present

## 2015-04-10 DIAGNOSIS — L97529 Non-pressure chronic ulcer of other part of left foot with unspecified severity: Secondary | ICD-10-CM | POA: Diagnosis present

## 2015-04-10 DIAGNOSIS — M1990 Unspecified osteoarthritis, unspecified site: Secondary | ICD-10-CM | POA: Diagnosis present

## 2015-04-10 DIAGNOSIS — G609 Hereditary and idiopathic neuropathy, unspecified: Secondary | ICD-10-CM | POA: Diagnosis present

## 2015-04-10 DIAGNOSIS — E43 Unspecified severe protein-calorie malnutrition: Secondary | ICD-10-CM | POA: Diagnosis present

## 2015-04-10 DIAGNOSIS — Z9181 History of falling: Secondary | ICD-10-CM

## 2015-04-10 DIAGNOSIS — G2581 Restless legs syndrome: Secondary | ICD-10-CM | POA: Diagnosis present

## 2015-04-10 DIAGNOSIS — I447 Left bundle-branch block, unspecified: Secondary | ICD-10-CM | POA: Diagnosis present

## 2015-04-10 DIAGNOSIS — Z79899 Other long term (current) drug therapy: Secondary | ICD-10-CM

## 2015-04-10 DIAGNOSIS — J189 Pneumonia, unspecified organism: Principal | ICD-10-CM | POA: Diagnosis present

## 2015-04-10 DIAGNOSIS — J9611 Chronic respiratory failure with hypoxia: Secondary | ICD-10-CM | POA: Diagnosis not present

## 2015-04-10 LAB — URINALYSIS, ROUTINE W REFLEX MICROSCOPIC
Bilirubin Urine: NEGATIVE
Glucose, UA: NEGATIVE mg/dL
Hgb urine dipstick: NEGATIVE
Ketones, ur: NEGATIVE mg/dL
Leukocytes, UA: NEGATIVE
Nitrite: NEGATIVE
Protein, ur: NEGATIVE mg/dL
Specific Gravity, Urine: 1.01 (ref 1.005–1.030)
Urobilinogen, UA: 0.2 mg/dL (ref 0.0–1.0)
pH: 8 (ref 5.0–8.0)

## 2015-04-10 LAB — CBC WITH DIFFERENTIAL/PLATELET
Basophils Absolute: 0 10*3/uL (ref 0.0–0.1)
Basophils Relative: 1 % (ref 0–1)
Eosinophils Absolute: 0 10*3/uL (ref 0.0–0.7)
Eosinophils Relative: 0 % (ref 0–5)
HCT: 37 % (ref 36.0–46.0)
Hemoglobin: 11.7 g/dL — ABNORMAL LOW (ref 12.0–15.0)
Lymphocytes Relative: 15 % (ref 12–46)
Lymphs Abs: 1 10*3/uL (ref 0.7–4.0)
MCH: 30.2 pg (ref 26.0–34.0)
MCHC: 31.6 g/dL (ref 30.0–36.0)
MCV: 95.4 fL (ref 78.0–100.0)
Monocytes Absolute: 0.4 10*3/uL (ref 0.1–1.0)
Monocytes Relative: 6 % (ref 3–12)
Neutro Abs: 4.9 10*3/uL (ref 1.7–7.7)
Neutrophils Relative %: 78 % — ABNORMAL HIGH (ref 43–77)
Platelets: 204 10*3/uL (ref 150–400)
RBC: 3.88 MIL/uL (ref 3.87–5.11)
RDW: 14.3 % (ref 11.5–15.5)
WBC: 6.3 10*3/uL (ref 4.0–10.5)

## 2015-04-10 LAB — BASIC METABOLIC PANEL
Anion gap: 9 (ref 5–15)
BUN: 13 mg/dL (ref 6–23)
CO2: 30 mmol/L (ref 19–32)
Calcium: 9.4 mg/dL (ref 8.4–10.5)
Chloride: 100 mmol/L (ref 96–112)
Creatinine, Ser: 0.53 mg/dL (ref 0.50–1.10)
GFR calc Af Amer: >90 mL/min (ref >90)
GFR calc non Af Amer: 79 mL/min — ABNORMAL LOW (ref >90)
Glucose, Bld: 102 mg/dL — ABNORMAL HIGH (ref 70–99)
Potassium: 3.7 mmol/L (ref 3.5–5.1)
Sodium: 139 mmol/L (ref 135–145)

## 2015-04-10 MED ORDER — ONDANSETRON HCL 4 MG/2ML IJ SOLN: 4.0000 mg | Freq: Four times a day (QID) | INTRAMUSCULAR | Status: DC | PRN

## 2015-04-10 MED ORDER — GABAPENTIN 100 MG PO CAPS
100.0000 mg | ORAL_CAPSULE | Freq: Every day | ORAL | Status: DC
  Administered 2015-04-10 – 2015-04-12 (×3): 100 mg via ORAL
  Filled 2015-04-10 (×3): qty 1

## 2015-04-10 MED ORDER — MAGNESIUM HYDROXIDE 400 MG/5ML PO SUSP
30.0000 mL | Freq: Every day | ORAL | Status: DC | PRN
  Administered 2015-04-12: 30 mL via ORAL
  Filled 2015-04-10: qty 30

## 2015-04-10 MED ORDER — OXYBUTYNIN CHLORIDE ER 10 MG PO TB24
10.0000 mg | ORAL_TABLET | Freq: Every day | ORAL | Status: DC
  Administered 2015-04-11 – 2015-04-12 (×2): 10 mg via ORAL
  Filled 2015-04-10 (×2): qty 1

## 2015-04-10 MED ORDER — IPRATROPIUM-ALBUTEROL 0.5-2.5 (3) MG/3ML IN SOLN
3.0000 mL | Freq: Four times a day (QID) | RESPIRATORY_TRACT | Status: DC
  Administered 2015-04-10 – 2015-04-11 (×5): 3 mL via RESPIRATORY_TRACT
  Filled 2015-04-10 (×5): qty 3

## 2015-04-10 MED ORDER — GABAPENTIN 100 MG PO CAPS
200.0000 mg | ORAL_CAPSULE | ORAL | Status: DC
  Administered 2015-04-11 – 2015-04-13 (×6): 200 mg via ORAL
  Filled 2015-04-10 (×8): qty 2

## 2015-04-10 MED ORDER — METHYLPREDNISOLONE SODIUM SUCC 125 MG IJ SOLR
125.0000 mg | Freq: Once | INTRAMUSCULAR | Status: AC
  Administered 2015-04-10: 125 mg via INTRAVENOUS
  Filled 2015-04-10: qty 2

## 2015-04-10 MED ORDER — SODIUM CHLORIDE 0.9 % IV SOLN: INTRAVENOUS | Status: AC

## 2015-04-10 MED ORDER — SODIUM CHLORIDE 0.9 % IJ SOLN
3.0000 mL | Freq: Two times a day (BID) | INTRAMUSCULAR | Status: DC
  Administered 2015-04-11: 3 mL via INTRAVENOUS

## 2015-04-10 MED ORDER — ACETAMINOPHEN 650 MG RE SUPP: 650.0000 mg | Freq: Four times a day (QID) | RECTAL | Status: DC | PRN

## 2015-04-10 MED ORDER — HYDROCODONE-ACETAMINOPHEN 5-325 MG PO TABS: 1.0000 | ORAL_TABLET | Freq: Three times a day (TID) | ORAL | Status: DC | PRN

## 2015-04-10 MED ORDER — CEFTRIAXONE SODIUM IN DEXTROSE 20 MG/ML IV SOLN
1.0000 g | INTRAVENOUS | Status: DC
  Administered 2015-04-11 – 2015-04-13 (×3): 1 g via INTRAVENOUS
  Filled 2015-04-10 (×3): qty 50

## 2015-04-10 MED ORDER — PANTOPRAZOLE SODIUM 40 MG PO TBEC
40.0000 mg | DELAYED_RELEASE_TABLET | Freq: Every day | ORAL | Status: DC
  Administered 2015-04-11 – 2015-04-13 (×3): 40 mg via ORAL
  Filled 2015-04-10 (×3): qty 1

## 2015-04-10 MED ORDER — SODIUM CHLORIDE 0.9 % IV SOLN: INTRAVENOUS | Status: DC

## 2015-04-10 MED ORDER — FENTANYL 12 MCG/HR TD PT72
12.5000 ug | MEDICATED_PATCH | TRANSDERMAL | Status: DC
  Administered 2015-04-13: 12.5 ug via TRANSDERMAL
  Filled 2015-04-10: qty 1

## 2015-04-10 MED ORDER — ENOXAPARIN SODIUM 30 MG/0.3ML ~~LOC~~ SOLN
30.0000 mg | SUBCUTANEOUS | Status: DC
  Administered 2015-04-10 – 2015-04-11 (×2): 30 mg via SUBCUTANEOUS
  Filled 2015-04-10 (×4): qty 0.3

## 2015-04-10 MED ORDER — CEFTRIAXONE SODIUM 1 G IJ SOLR
1.0000 g | Freq: Once | INTRAMUSCULAR | Status: AC
  Administered 2015-04-10: 1 g via INTRAVENOUS
  Filled 2015-04-10: qty 10

## 2015-04-10 MED ORDER — DEXTROSE 5 % IV SOLN
500.0000 mg | INTRAVENOUS | Status: DC
  Administered 2015-04-11 – 2015-04-13 (×3): 500 mg via INTRAVENOUS
  Filled 2015-04-10 (×3): qty 500

## 2015-04-10 MED ORDER — VITAMIN D 1000 UNITS PO TABS
1000.0000 [IU] | ORAL_TABLET | Freq: Every day | ORAL | Status: DC
  Administered 2015-04-11 – 2015-04-13 (×3): 1000 [IU] via ORAL
  Filled 2015-04-10 (×3): qty 1

## 2015-04-10 MED ORDER — LUBIPROSTONE 24 MCG PO CAPS
24.0000 ug | ORAL_CAPSULE | Freq: Two times a day (BID) | ORAL | Status: DC
  Administered 2015-04-11 – 2015-04-13 (×6): 24 ug via ORAL
  Filled 2015-04-10 (×8): qty 1

## 2015-04-10 MED ORDER — BISACODYL 10 MG RE SUPP: 10.0000 mg | Freq: Every day | RECTAL | Status: DC | PRN

## 2015-04-10 MED ORDER — ALUM & MAG HYDROXIDE-SIMETH 200-200-20 MG/5ML PO SUSP: 30.0000 mL | Freq: Four times a day (QID) | ORAL | Status: DC | PRN

## 2015-04-10 MED ORDER — METHYLPREDNISOLONE SODIUM SUCC 125 MG IJ SOLR
40.0000 mg | Freq: Two times a day (BID) | INTRAMUSCULAR | Status: DC
  Administered 2015-04-10 – 2015-04-13 (×6): 40 mg via INTRAVENOUS
  Filled 2015-04-10 (×6): qty 2

## 2015-04-10 MED ORDER — ONDANSETRON HCL 4 MG PO TABS: 4.0000 mg | ORAL_TABLET | Freq: Four times a day (QID) | ORAL | Status: DC | PRN

## 2015-04-10 MED ORDER — DEXTROSE 5 % IV SOLN
500.0000 mg | Freq: Once | INTRAVENOUS | Status: AC
  Administered 2015-04-10: 500 mg via INTRAVENOUS
  Filled 2015-04-10: qty 500

## 2015-04-10 MED ORDER — ACETAMINOPHEN 325 MG PO TABS: 650.0000 mg | ORAL_TABLET | Freq: Four times a day (QID) | ORAL | Status: DC | PRN

## 2015-04-10 MED ORDER — FLEET ENEMA 7-19 GM/118ML RE ENEM: 1.0000 | ENEMA | Freq: Once | RECTAL | Status: AC | PRN

## 2015-04-10 MED ORDER — OMEGA-3-ACID ETHYL ESTERS 1 G PO CAPS
2.0000 g | ORAL_CAPSULE | Freq: Every day | ORAL | Status: DC
  Administered 2015-04-11 – 2015-04-13 (×3): 2 g via ORAL
  Filled 2015-04-10 (×3): qty 2

## 2015-04-10 MED ORDER — DOCUSATE SODIUM 100 MG PO CAPS
200.0000 mg | ORAL_CAPSULE | Freq: Two times a day (BID) | ORAL | Status: DC
  Administered 2015-04-10 – 2015-04-13 (×6): 200 mg via ORAL
  Filled 2015-04-10 (×6): qty 2

## 2015-04-10 MED ORDER — PRAMIPEXOLE DIHYDROCHLORIDE 0.125 MG PO TABS
0.5000 mg | ORAL_TABLET | Freq: Every day | ORAL | Status: DC
  Administered 2015-04-10 – 2015-04-12 (×3): 0.5 mg via ORAL
  Filled 2015-04-10 (×3): qty 4

## 2015-04-10 MED ORDER — TRAMADOL HCL 50 MG PO TABS
50.0000 mg | ORAL_TABLET | Freq: Two times a day (BID) | ORAL | Status: DC | PRN
  Administered 2015-04-10: 50 mg via ORAL
  Filled 2015-04-10: qty 1

## 2015-04-10 NOTE — ED Notes (Signed)
Pt reports to the ED for eval of intermittent SOB. Pt wears 2 L of O2 at home. She has a hx of this happening and per family they have to turn her O2 up to 6L and let her lay in the bed and her symptoms will usually resolve. Pt reports that today she developed increased SOB. She denies any cough or CP. Her O2 sats are 100% on 2 L. At this time her resp are even and unlabored. 12 lead shows LBBB. She has hx of same. Pts family member reports increased lethargy as well > 1 week. No changes in mental status. She also has chronic constipation and has decreased appetite x 1 month. Pt denies any dysuria but does report some urinary urgency. Denies any frequency. Pt A&Ox4, resp e/u, and skin warm and dry.

## 2015-04-10 NOTE — H&P (Signed)
[facility] History and Physical                                                                                    Kerry Myers, is a 81 y.o. female  MRN: [id number]   DOB - May 01, 1919  Admit Date - 04/10/2015  Outpatient Primary MD for the patient is Garnet Koyanagi, DO  With History of -  Past Medical History  Diagnosis Date  . GERD (gastroesophageal reflux disease)   . Bladder incontinence   . Neuropathic pain   . RLS (restless legs syndrome)   . Arthritis   . LBBB (left bundle branch block)       Past Surgical History  Procedure Laterality Date  . Tonsillectomy    . Abdominal hysterectomy      TVH  . Incontinence surgery    . Excision morton's neuroma    . Eye surgery      cataracts  . Amputation  04/30/2012    Procedure: AMPUTATION DIGIT;  Surgeon: Colin Rhein, MD;  Location: [facility];  Service: Orthopedics;  Laterality: Right;  right 2nd toe amptation through MTP joint    in for   Chief Complaint  Patient presents with  . Shortness of Breath     HPI  Kerry Myers  is a 81 y.o. female, with a past medical history significant for chronic respiratory failure on 2 L of oxygen at home, left bundle branch block, GERD, neuropathic pain, scoliosis, and severe constipation. She is brought to the ER this morning due to shortness of breath. Her family reports that she has had decreased energy for the past week. She has had an increased oxygen requirement at home occasionally going up to as much as 6 L of oxygen. For the past 2 mornings at the breakfast table she has developed rigors and difficulty breathing. This morning she also became dizzy and her family decided to bring her to the ER. She has been unable to eat or drink much at all. Per her family she has not been eating well for the last month. She has severe chronic constipation. Her last bowel movement was proximally 2 days ago.  In the ER chest x-ray shows possible COPD exacerbation with left lower  lobe infiltrate. The EDP also noted that the patient's left pupil was dilated and has ordered a CT head.  Review of Systems   In addition to the HPI above,   No Headache, No changes with hearing, No problems swallowing food or Liquids, No Chest pain, No Abdominal pain, No Nausea or Vomiting No Blood in stool or Urine, No dysuria, No new skin rashes or bruises, No new joints pains-aches,  No new weakness, tingling, numbness in any extremity, No recent weight gain or loss, A full 10 point Review of Systems was done, except as stated above, all other Review of Systems were negative.  Social History History  Substance Use Topics  . Smoking status: Never Smoker   . Smokeless tobacco: Never Used  . Alcohol Use: No    Family History No family history on file.  Prior to Admission medications   Medication Sig Start Date End  Date Taking? Authorizing Provider  albuterol (PROVENTIL HFA;VENTOLIN HFA) 108 (90 BASE) MCG/ACT inhaler Inhale 2 puffs into the lungs every 6 (six) hours as needed for wheezing. 03/27/13  Yes Janece Canterbury, MD  Calcium Carb-Cholecalciferol (CALCIUM 600 + D PO) Take 1 tablet by mouth 2 (two) times daily.   Yes Historical Provider, MD  Cholecalciferol (VITAMIN D3) 5000 UNITS CAPS Take 1 capsule by mouth daily.   Yes Historical Provider, MD  Docusate Calcium (STOOL SOFTENER PO) Take 2 tablets by mouth 2 (two) times daily.   Yes Historical Provider, MD  fentaNYL (DURAGESIC - DOSED MCG/HR) 12 MCG/HR Place 1 patch (12.5 mcg total) onto the skin every 3 (three) days. 03/14/15  Yes Rosalita Chessman, DO  gabapentin (NEURONTIN) 100 MG capsule Take 2 capsules by mouth  every morning , 2 capsules  by mouth at 2pm and 1  capsule at bedtime 03/28/15  Yes Rosalita Chessman, DO  HYDROcodone-acetaminophen (NORCO) 5-325 MG per tablet Take 1 tablet by mouth every 8 (eight) hours as needed for moderate pain. 01/17/15  Yes Brunetta Jeans, PA-C  L-Methylfolate-B6-B12 Lebron Quam) 3-35-2 MG TABS  Take 1 tablet by mouth two  times daily 01/31/15  Yes Alferd Apa [name], DO  magnesium hydroxide (MILK OF MAGNESIA) 400 MG/5ML suspension Take 30 mLs by mouth daily as needed for mild constipation.    Yes Historical Provider, MD  Multiple Vitamin (MULTIVITAMIN WITH MINERALS) TABS Take 1 tablet by mouth every morning.    Yes Historical Provider, MD  Omega-3 Fatty Acids (FISH OIL) 1000 MG CPDR Take 2,000 mg by mouth daily with lunch.    Yes Historical Provider, MD  omeprazole (PRILOSEC) 40 MG capsule Take 1 capsule (40 mg total) by mouth at bedtime. 11/16/14  Yes Rosalita Chessman, DO  oxybutynin (DITROPAN-XL) 10 MG 24 hr tablet Take 1 tablet by mouth  daily 01/31/15  Yes Rosalita Chessman, DO  pramipexole (MIRAPEX) 0.5 MG tablet Take 1 tablet by mouth at  bedtime 01/17/15  Yes Yvonne R [name], DO  traMADol (ULTRAM) 50 MG tablet Take 1 tablet (50 mg total) by mouth 2 (two) times daily as needed. Patient taking differently: Take 50 mg by mouth 2 (two) times daily as needed (pain).  02/03/15  Yes Rosalita Chessman, DO  zoster vaccine live, PF, (ZOSTAVAX) 25053 UNT/0.65ML injection Inject 19,400 Units into the skin once. 01/11/15   Rosalita Chessman, DO    No Known Allergies  Physical Exam  Vitals  Blood pressure 153/59, pulse 60, temperature 97.6 F (36.4 C), temperature source Oral, resp. rate 12, SpO2 100 %.   General:  Elderly, frail, cachectic female lying in bed, speaks in a whisper. Son and daughter-in-law at bedside  Psych:  Normal affect and insight, Not Suicidal or Homicidal, Awake Alert, Oriented X 3.  ENT:  Left pupil> right. No exudates or erythema in her oropharynx. Dry mucous membranes.  Neck:  Supple, No lymphadenopathy appreciated  Respiratory:  Minimal inspiratory crackles on the left, otherwise clear to auscultation  Cardiac:  RRR, PMI displaced to the left. No frank murmurs rubs or gallops  Abdomen:  Positive bowel sounds, Soft, Non tender, Non distended,  No masses appreciated  Skin:   Left great toe has an ulceration, right great toenail is black, right foot has a healing laceration across the dorsum  Extremities:  Able to move all 4. 5/5 strength in each,  no effusions.  Data Review  CBC  Recent Labs Lab 04/10/15 1353  WBC  6.3  HGB 11.7*  HCT 37.0  PLT 204  MCV 95.4  MCH 30.2  MCHC 31.6  RDW 14.3  LYMPHSABS 1.0  MONOABS 0.4  EOSABS 0.0  BASOSABS 0.0    Chemistries   Recent Labs Lab 04/10/15 1353  NA 139  K 3.7  CL 100  CO2 30  GLUCOSE 102*  BUN 13  CREATININE 0.53  CALCIUM 9.4    Urinalysis    Component Value Date/Time   COLORURINE YELLOW 04/10/2015 1540   APPEARANCEUR CLEAR 04/10/2015 1540   LABSPEC 1.010 04/10/2015 1540   PHURINE 8.0 04/10/2015 1540   GLUCOSEU NEGATIVE 04/10/2015 1540   HGBUR NEGATIVE 04/10/2015 1540   BILIRUBINUR NEGATIVE 04/10/2015 1540   BILIRUBINUR Neg 04/02/2013 1401   KETONESUR NEGATIVE 04/10/2015 1540   PROTEINUR NEGATIVE 04/10/2015 1540   PROTEINUR Neg 04/02/2013 1401   UROBILINOGEN 0.2 04/10/2015 1540   UROBILINOGEN 0.2 04/02/2013 1401   NITRITE NEGATIVE 04/10/2015 1540   NITRITE Neg 04/02/2013 1401   LEUKOCYTESUR NEGATIVE 04/10/2015 1540    Imaging results:   Dg Chest 2 View  04/10/2015   CLINICAL DATA:  Intermittent shortness of breath, fatigue, loss of appetite this week  EXAM: CHEST  2 VIEW  COMPARISON:  No prior exams are available for comparison  FINDINGS: Kyphotic positioning.  Normal heart size, mediastinal contours and pulmonary vascularity for technique.  Tortuosity of thoracic aorta.  Emphysematous changes with central peribronchial thickening.  Mild atelectasis versus infiltrate in LEFT lower lobe.  Minimal atelectasis at RIGHT base.  Upper lungs clear.  No pleural effusion or definite pneumothorax.  Bones demineralized.  IMPRESSION: COPD changes with atelectasis versus consolidation in LEFT lower lobe and minimal RIGHT basilar atelectasis.   Electronically Signed   By: Lavonia Dana M.D.   On:  04/10/2015 15:09    Assessment & Plan  Principal Problem:   Acute on chronic respiratory failure Active Problems:   RESTLESS LEG SYNDROME   Hereditary and idiopathic peripheral neuropathy   Osteoarthritis   Osteoporosis   SCOLIOSIS, THORACOLUMBAR   GERD   PNA (pneumonia)   CAP (community acquired pneumonia)   Protein-calorie malnutrition, severe   At high risk for falls   Constipation due to opioid therapy   Toe ulcer   Pupil dilation  Acute on chronic respiratory failure  secondary to pneumonia in the setting of restrictive lung disease due to scoliosis On chronic oxygen (2 L at home). Will increase oxygen as needed.  Community-acquired pneumonia Left lower lobe infiltrate seen on x-ray Being treated with azithromycin and Rocephin, scheduled nebulizers, low-dose Solu-Medrol. Urinary antigen for Legionella and strep pneumoniae are pending.  Left great toe ulcer Wound care consultation  Dilated left pupil EDP has ordered CT head. Results are pending. Neuro exam is otherwise nonfocal.  Peripheral neuropathy Continue gabapentin  Severe constipation due to opioids Colace twice a day, Amitiza twice a day, milk of magnesia when necessary, enema when necessary  Chronic pain Will continue home pain medications at the same dosages. Including fentanyl patch  GERD Continue PPI     DVT Prophylaxis: Lovenox  AM Labs Ordered, also please review Full Orders  Family Communication:   Son and daughter-in-law at bedside  Code Status:  DO NOT RESUSCITATE  Condition:  Guarded  Time spent in minutes : [place],  PA-C on 04/10/2015 at 4:45 PM  Between 7am to 7pm - Pager - [phone]  After 7pm go to [email or web address] - password [email or web address]  And look for the night  coverage person covering me after hours  [facility]

## 2015-04-10 NOTE — ED Provider Notes (Signed)
CSN: [id number]     Arrival date & time 04/10/15  1240 History   First MD Initiated Contact with Patient 04/10/15 1259     Chief Complaint  Patient presents with  . Shortness of Breath   HPI   81 year old female presents today with shortness of breath. Patient does complain by her daughter-in-law. She states cars 2 L of oxygen at home, but recently has had increased oxygen demand. Port that over the last 2 days she's been tachypnea and have been unable to meet her needs with increased from 2-6 L of oxygen. Usually when this happens they're able to increase the amount of oxygen she is receiving which brings down her rapid breathing. They note a history over the course of the last month with decreased appetite and progressive fatigue. In addition to the oxygen demand they also note she's been complaining of "feeling weird" in the head". Patient reports that her vision has changed over the last 2 days but cannot describe further. They note that she normally is able to ambulate with the assistance of her walker for short distance, but today was unable to get off the toilet without assistance. Denies any change in mental status, confusion, fever, chest pain, or lower extremity swelling. Patient notes a history of chronic abdominal pain due to abnormal anatomy; no change in baseline status.   Past Medical History  Diagnosis Date  . GERD (gastroesophageal reflux disease)   . Bladder incontinence   . Neuropathic pain   . RLS (restless legs syndrome)   . Arthritis   . LBBB (left bundle branch block)    Past Surgical History  Procedure Laterality Date  . Tonsillectomy    . Abdominal hysterectomy      TVH  . Incontinence surgery    . Excision morton's neuroma    . Eye surgery      cataracts  . Amputation  04/30/2012    Procedure: AMPUTATION DIGIT;  Surgeon: Colin Rhein, MD;  Location: [facility];  Service: Orthopedics;  Laterality: Right;  right 2nd toe amptation through MTP joint    No family history on file. History  Substance Use Topics  . Smoking status: Never Smoker   . Smokeless tobacco: Never Used  . Alcohol Use: No   OB History    No data available     Review of Systems  All other systems reviewed and are negative.  Allergies  Review of patient's allergies indicates no known allergies.  Home Medications   Prior to Admission medications   Medication Sig Start Date End Date Taking? Authorizing Provider  albuterol (PROVENTIL HFA;VENTOLIN HFA) 108 (90 BASE) MCG/ACT inhaler Inhale 2 puffs into the lungs every 6 (six) hours as needed for wheezing. 03/27/13   Janece Canterbury, MD  Calcium Carb-Cholecalciferol (CALCIUM 600 + D PO) Take 1 tablet by mouth 2 (two) times daily.    Historical Provider, MD  Cholecalciferol (VITAMIN D3) 5000 UNITS CAPS Take 1 capsule by mouth daily.    Historical Provider, MD  Docusate Calcium (STOOL SOFTENER PO) Take 2 tablets by mouth 2 (two) times daily.    Historical Provider, MD  fentaNYL (DURAGESIC - DOSED MCG/HR) 12 MCG/HR Place 1 patch (12.5 mcg total) onto the skin every 3 (three) days. 03/14/15   Rosalita Chessman, DO  gabapentin (NEURONTIN) 100 MG capsule Take 2 capsules by mouth  every morning , 2 capsules  by mouth at 2pm and 1  capsule at bedtime 03/28/15   Alferd Apa  [name], DO  HYDROcodone-acetaminophen (NORCO) 5-325 MG per tablet Take 1 tablet by mouth every 8 (eight) hours as needed for moderate pain. 01/17/15   Brunetta Jeans, PA-C  L-Methylfolate-B6-B12 Lebron Quam) 3-35-2 MG TABS Take 1 tablet by mouth two  times daily 01/31/15   Rosalita Chessman, DO  magnesium hydroxide (MILK OF MAGNESIA) 400 MG/5ML suspension Take by mouth daily as needed for mild constipation.    Historical Provider, MD  Multiple Vitamin (MULTIVITAMIN WITH MINERALS) TABS Take 1 tablet by mouth every morning.     Historical Provider, MD  Omega-3 Fatty Acids (FISH OIL) 1000 MG CPDR Take 2,000 mg by mouth daily with lunch.     Historical Provider, MD  omeprazole  (PRILOSEC) 40 MG capsule Take 1 capsule (40 mg total) by mouth at bedtime. 11/16/14   Rosalita Chessman, DO  oxybutynin (DITROPAN-XL) 10 MG 24 hr tablet Take 1 tablet by mouth  daily 01/31/15   Rosalita Chessman, DO  pramipexole (MIRAPEX) 0.5 MG tablet Take 1 tablet by mouth at  bedtime 01/17/15   Rosalita Chessman, DO  traMADol (ULTRAM) 50 MG tablet Take 1 tablet (50 mg total) by mouth 2 (two) times daily as needed. 02/03/15   Rosalita Chessman, DO  zoster vaccine live, PF, (ZOSTAVAX) 46962 UNT/0.65ML injection Inject 19,400 Units into the skin once. 01/11/15   Yvonne R [name], DO   BP 162/55 mmHg  Pulse 67  Temp(Src) 97.6 F (36.4 C) (Oral)  Resp 11  SpO2 100% Physical Exam  Constitutional: She is oriented to person, place, and time. She appears well-developed and well-nourished.  HENT:  Head: Normocephalic and atraumatic.  Eyes: Conjunctivae and EOM are normal. Left pupil is not reactive. Pupils are unequal.  Neck: Normal range of motion. Neck supple. No JVD present. No tracheal deviation present. No thyromegaly present.  Cardiovascular: Normal rate, regular rhythm, normal heart sounds and intact distal pulses.  Exam reveals no gallop and no friction rub.   No murmur heard. Pulmonary/Chest: Effort normal. No stridor. No respiratory distress. She has decreased breath sounds in the right lower field. She has no wheezes. She has no rhonchi. She has no rales. She exhibits no tenderness.  Abdominal: Soft. She exhibits no distension and no mass. There is tenderness. There is no rebound and no guarding.  Musculoskeletal: Normal range of motion.  Lymphadenopathy:    She has no cervical adenopathy.  Neurological: She is alert and oriented to person, place, and time. Coordination normal.  Skin: Skin is warm and dry.  Psychiatric: She has a normal mood and affect. Her behavior is normal. Judgment and thought content normal.  Nursing note and vitals reviewed.   ED Course  Procedures (including critical care  time) Labs Review Labs Reviewed - No data to display  Imaging Review No results found.   EKG Interpretation None     MDM   Final diagnoses:  Dizziness  CAP (community acquired pneumonia)   Labs: Urinalysis, CBC, BMP no significant findings  Imaging: DG chest COPD changes with atelectasis versus consolidation in left lower lobe and minimal right basilar atelectasis  Consults: Medicine  Therapeutics: Ceftriaxone, Solu-Medrol  Assessment: CAP, dizziness  Plan: Pt presents with new baseline 02 requirement, increased respirations, with probable pneumonia. Pt also noted some changes in her vision, had an "dizzinss" and was found to have a noticeable larger non reactive left pupil. Ct head was ordered. Hospital service was consulted who will admit for inpatient treatment. Pt and her family understood  and agreed to the plan. She remained stable throughout her ED stay.      Okey Regal, PA-C 04/10/15 1649  Carmin Muskrat, MD 04/10/15 [phone]

## 2015-04-11 DIAGNOSIS — K219 Gastro-esophageal reflux disease without esophagitis: Secondary | ICD-10-CM

## 2015-04-11 DIAGNOSIS — J9620 Acute and chronic respiratory failure, unspecified whether with hypoxia or hypercapnia: Secondary | ICD-10-CM

## 2015-04-11 LAB — INFLUENZA PANEL BY PCR (TYPE A & B)
H1N1FLUPCR: NOT DETECTED
INFLAPCR: NEGATIVE
Influenza B By PCR: NEGATIVE

## 2015-04-11 LAB — CBC
HCT: 32.6 % — ABNORMAL LOW (ref 36.0–46.0)
HEMOGLOBIN: 10.6 g/dL — AB (ref 12.0–15.0)
MCH: 30.2 pg (ref 26.0–34.0)
MCHC: 32.5 g/dL (ref 30.0–36.0)
MCV: 92.9 fL (ref 78.0–100.0)
Platelets: 203 10*3/uL (ref 150–400)
RBC: 3.51 MIL/uL — ABNORMAL LOW (ref 3.87–5.11)
RDW: 14.1 % (ref 11.5–15.5)
WBC: 4.8 10*3/uL (ref 4.0–10.5)

## 2015-04-11 LAB — STREP PNEUMONIAE URINARY ANTIGEN: STREP PNEUMO URINARY ANTIGEN: NEGATIVE

## 2015-04-11 MED ORDER — ENSURE ENLIVE PO LIQD
237.0000 mL | Freq: Two times a day (BID) | ORAL | Status: DC
  Administered 2015-04-12 – 2015-04-13 (×3): 237 mL via ORAL

## 2015-04-11 NOTE — Evaluation (Signed)
Clinical/Bedside Swallow Evaluation Patient Details  Name: Kerry Myers MRN: [id number] Date of Birth: Feb 10, 1919  Today's Date: 04/11/2015 Time: SLP Start Time (ACUTE ONLY): 1107 SLP Stop Time (ACUTE ONLY): 1131 SLP Time Calculation (min) (ACUTE ONLY): 24 min  Past Medical History:  Past Medical History  Diagnosis Date  . GERD (gastroesophageal reflux disease)   . Bladder incontinence   . Neuropathic pain   . RLS (restless legs syndrome)   . Arthritis   . LBBB (left bundle branch block)    Past Surgical History:  Past Surgical History  Procedure Laterality Date  . Tonsillectomy    . Abdominal hysterectomy      TVH  . Incontinence surgery    . Excision morton's neuroma    . Eye surgery      cataracts  . Amputation  04/30/2012    Procedure: AMPUTATION DIGIT;  Surgeon: Colin Rhein, MD;  Location: [facility];  Service: Orthopedics;  Laterality: Right;  right 2nd toe amptation through MTP joint   HPI:  Kerry Myers is a 81 y.o. female who presens with SOB, lethargy, dizziness, and increased oxygen requirements (up to 6L by [place], baseline 2L). CXR shows possible COPD execerbation and LLL infiltrate. CT Head withuot acute changes. PMH significant for chronic respiratory failure on 2 L of oxygen at home, left bundle branch block, GERD, neuropathic pain, scoliosis, and severe constipation. Previous MBS 02/2013 recommending regular diet and thin liquids.   Assessment / Plan / Recommendation Clinical Impression  Pt presents with signs of dysphagia that appear consistent with previous swallowing assessments per records, with most recent in 2014. She has slow but effective oral manipulation although with subjective reports of difficulty with regular textures. Multiple, audible swallows are noted per bolus but without overt evidence of aspiration. Recommend Dys 3 diet for energy conservation and thin liquids with brief SLP f/u for tolerance given h/o mild dysphagia and current  increase in respiratory needs.    Aspiration Risk  Mild    Diet Recommendation Dysphagia 3 (Mechanical Soft);Thin liquid   Liquid Administration via: Cup;Straw Medication Administration: Whole meds with puree Supervision: Patient able to self feed;Intermittent supervision to cue for compensatory strategies Compensations: Slow rate;Small sips/bites Postural Changes and/or Swallow Maneuvers: Seated upright 90 degrees;Upright 30-60 min after meal    Other  Recommendations Oral Care Recommendations: Oral care BID   Follow Up Recommendations  None    Frequency and Duration min 2x/week  1 week   Pertinent Vitals/Pain n/a    SLP Swallow Goals     Swallow Study Prior Functional Status       General HPI: Kerry Myers is a 81 y.o. female who presens with SOB, lethargy, dizziness, and increased oxygen requirements (up to 6L by , baseline 2L). CXR shows possible COPD execerbation and LLL infiltrate. CT Head withuot acute changes. PMH significant for chronic respiratory failure on 2 L of oxygen at home, left bundle branch block, GERD, neuropathic pain, scoliosis, and severe constipation. Previous MBS 02/2013 recommending regular diet and thin liquids. Type of Study: Bedside swallow evaluation Previous Swallow Assessment: see HPI Diet Prior to this Study: Regular;Thin liquids Temperature Spikes Noted: No Respiratory Status: Nasal cannula History of Recent Intubation: No Behavior/Cognition: Alert;Cooperative;Pleasant mood;Hard of hearing Self-Feeding Abilities: Able to feed self Patient Positioning: Upright in bed Baseline Vocal Quality: Clear    Oral/Motor/Sensory Function Overall Oral Motor/Sensory Function: Appears within functional limits for tasks assessed   Ice Chips Ice chips: Not tested   Thin  Liquid Thin Liquid: Impaired Presentation: Self Fed;Straw Pharyngeal  Phase Impairments: Multiple swallows;Other (comments);Suspected delayed Swallow (audible swallow)    Nectar  Thick Nectar Thick Liquid: Not tested   Honey Thick Honey Thick Liquid: Not tested   Puree Puree: Impaired Presentation: Self Fed;Spoon Pharyngeal Phase Impairments: Multiple swallows;Other (comments) (audible swallows)   Solid    Solid: Impaired Presentation: Self Fed Oral Phase Impairments: Impaired mastication;Other (comment) (pt reports difficulty masticating) Pharyngeal Phase Impairments: Multiple swallows      Germain Osgood, M.A. CCC-SLP [phone]  Germain Osgood 04/11/2015,11:46 AM

## 2015-04-11 NOTE — Consult Note (Signed)
WOC wound consult note Reason for Consult: left great toe ulceration.  Pt has fungal great toe nails bilaterally, she has some blanchable redness on the right great toe. She is noted to have dry skin on the left great toe and what appears to be a healed area from previous breakdown. She reports she has a family MD that may take her great toe nails off if they become a problem.  Questioned the patient about ill fitting shoes since both great toe tips are affected, but she reports she has not been wearing shoes lately, mostly in the bed.  Wound type: dry skin, no open ulcer, blanchable area  Drainage (amount, consistency, odor) none Periwound: intact Dressing procedure/placement/frequency: No topical care needed at this time. Follow up with family MD for toenail issues.  Discussed POC with patient. Re consult if needed, will not follow at this time. Thanks  Tasmine Hipwell Kellogg, [place] [phone])

## 2015-04-11 NOTE — Progress Notes (Signed)
OT Cancellation Note  Patient Details Name: Kerry Myers MRN: [id number] DOB: Nov 25, 1919   Cancelled Treatment:    Reason Eval/Treat Not Completed: Other (comment) Pt eating dinner. Will return in am. [place], OTR/L  [phone] 04/11/2015 04/11/2015, 5:23 PM

## 2015-04-11 NOTE — Progress Notes (Signed)
Utilization review completed.  

## 2015-04-11 NOTE — Progress Notes (Signed)
[facility] PROGRESS NOTE  Kerry Myers XTG:[id number] DOB: 1919-09-20 DOA: 04/10/2015 PCP: Garnet Koyanagi, DO  Assessment/Plan: Acute on chronic respiratory failure  secondary to pneumonia in the setting of restrictive lung disease due to scoliosis - Will continue supplemental oxygen (patient on 2 liters at home)  Community-acquired pneumonia Left lower lobe infiltrate seen on x-ray - Continue azithromycin and Rocephin, scheduled nebulizers, low-dose Solu-Medrol. - f/u with Urinary antigen for Legionella and strep pneumoniae  Left great toe ulcer -Wound care consultated  Dilated left pupil EDP has ordered CT head. Reports no acute intracranial abnormalities Neuro exam is otherwise nonfocal.  Peripheral neuropathy Continue gabapentin  Severe constipation due to opioids Colace twice a day, Amitiza twice a day, milk of magnesia when necessary, enema when necessary  Chronic pain Will continue home pain medications at the same dosages. Including fentanyl patch  GERD Continue PPI   Code Status: DO NOT RESUSCITATE Family Communication: No family at bedside Disposition Plan: Pending improvement in condition   Consultants:  None  Procedures:  None  Antibiotics:  Azithromycin and Rocephin  HPI/Subjective: Pt has no new complaints. No acute issues overnight. States she feels better  Objective: Filed Vitals:   04/11/15 1300  BP: 134/63  Pulse: 82  Temp:   Resp: 18    Intake/Output Summary (Last 24 hours) at 04/11/15 1733 Last data filed at 04/11/15 1300  Gross per 24 hour  Intake    290 ml  Output     50 ml  Net    240 ml   There were no vitals filed for this visit.  Exam:   General:  Pt in nad, alert and awake  Cardiovascular: rrr, no mrg  Respiratory: cta bl, no wheezes, no increased wob on 4 L [place]  Abdomen: soft, NT, ND  Musculoskeletal:  No cyanosis or clubbing   Data Reviewed: Basic Metabolic Panel:  Recent Labs Lab  04/10/15 1353  NA 139  K 3.7  CL 100  CO2 30  GLUCOSE 102*  BUN 13  CREATININE 0.53  CALCIUM 9.4   Liver Function Tests: No results for input(s): AST, ALT, ALKPHOS, BILITOT, PROT, ALBUMIN in the last 168 hours. No results for input(s): LIPASE, AMYLASE in the last 168 hours. No results for input(s): AMMONIA in the last 168 hours. CBC:  Recent Labs Lab 04/10/15 1353 04/11/15 0640  WBC 6.3 4.8  NEUTROABS 4.9  --   HGB 11.7* 10.6*  HCT 37.0 32.6*  MCV 95.4 92.9  PLT 204 203   Cardiac Enzymes: No results for input(s): CKTOTAL, CKMB, CKMBINDEX, TROPONINI in the last 168 hours. BNP (last 3 results) No results for input(s): BNP in the last 8760 hours.  ProBNP (last 3 results) No results for input(s): PROBNP in the last 8760 hours.  CBG: No results for input(s): GLUCAP in the last 168 hours.  No results found for this or any previous visit (from the past 240 hour(s)).   Studies: Dg Chest 2 View  04/10/2015   CLINICAL DATA:  Intermittent shortness of breath, fatigue, loss of appetite this week  EXAM: CHEST  2 VIEW  COMPARISON:  No prior exams are available for comparison  FINDINGS: Kyphotic positioning.  Normal heart size, mediastinal contours and pulmonary vascularity for technique.  Tortuosity of thoracic aorta.  Emphysematous changes with central peribronchial thickening.  Mild atelectasis versus infiltrate in LEFT lower lobe.  Minimal atelectasis at RIGHT base.  Upper lungs clear.  No pleural effusion or definite pneumothorax.  Bones demineralized.  IMPRESSION: COPD changes with atelectasis versus consolidation in LEFT lower lobe and minimal RIGHT basilar atelectasis.   Electronically Signed   By: Lavonia Dana M.D.   On: 04/10/2015 15:09   Ct Head Wo Contrast  04/10/2015   CLINICAL DATA:  Dizziness. Shortness of breath. Lethargy for 1 week. Decreased appetite.  EXAM: CT HEAD WITHOUT CONTRAST  TECHNIQUE: Contiguous axial images were obtained from the base of the skull through  the vertex without intravenous contrast.  COMPARISON:  None.  FINDINGS: No mass lesion, mass effect, midline shift, hydrocephalus, hemorrhage. No acute territorial cortical ischemia/infarct. Atrophy and chronic ischemic white matter disease is present. Intracranial atherosclerosis is present. Hyperostosis of the skull. The head is rotated to the LEFT relative to the cervical spine.  IMPRESSION: Atrophy and chronic ischemic white matter disease without acute intracranial abnormality.   Electronically Signed   By: Dereck Ligas M.D.   On: 04/10/2015 17:18    Scheduled Meds: . azithromycin  500 mg Intravenous Q24H  . cefTRIAXone (ROCEPHIN)  IV  1 g Intravenous Q24H  . cholecalciferol  1,000 Units Oral Daily  . docusate sodium  200 mg Oral BID  . enoxaparin (LOVENOX) injection  30 mg Subcutaneous Q24H  . feeding supplement (ENSURE ENLIVE)  237 mL Oral BID BM  . [START ON 04/13/2015] fentaNYL  12.5 mcg Transdermal Q72H  . gabapentin  100 mg Oral QHS  . gabapentin  200 mg Oral 2 times per day  . ipratropium-albuterol  3 mL Nebulization Q6H  . lubiprostone  24 mcg Oral BID WC  . methylPREDNISolone (SOLU-MEDROL) injection  40 mg Intravenous Q12H  . omega-3 acid ethyl esters  2 g Oral Daily  . oxybutynin  10 mg Oral QHS  . pantoprazole  40 mg Oral Daily  . pramipexole  0.5 mg Oral QHS  . sodium chloride  3 mL Intravenous Q12H   Continuous Infusions:    Time spent: > 35 minutes    Velvet Bathe  [facility] Pager [phone] If 7PM-7AM, please contact night-coverage at [email or web address], password [facility] 04/11/2015, 5:33 PM  LOS: 1 day

## 2015-04-11 NOTE — Progress Notes (Signed)
INITIAL NUTRITION ASSESSMENT  DOCUMENTATION CODES Per approved criteria  -Severe malnutrition in the context of chronic illness   INTERVENTION: Provide Ensure Enlive po BID, each supplement provides 350 kcal and 20 grams of protein.  Recommend obtaining new weight to fully assess weight trends.  Encourage adequate PO intake.  NUTRITION DIAGNOSIS: Malnutrition related to chronic illness as evidenced by severe fat and muscle mass loss.   Goal: Pt to meet >/= 90% of their estimated nutrition needs   Monitor:  PO intake, weight trends, labs, I/O's  Reason for Assessment: MD consult for assessment of nutrition requirements/status  81 y.o. female  Admitting Dx: Acute on chronic respiratory failure  ASSESSMENT: Pt with a past medical history significant for chronic respiratory failure on 2 L of oxygen at home, left bundle branch block, GERD, neuropathic pain, scoliosis, and severe constipation. Pt presents due to shortness of breath. Pt with possible PNA on CXR.  Pt reports her appetite is fine. Current meal completion is 25%. Pt reports she usually eats 2 meals a day at home with an Ensure once daily. Noted no new weight recorded. Pt was briefly weighed on bed scale; 90 lbs. Pt reports usual body weight of 93 lbs. Pt is agreeable to Ensure. RD to order Pt was encouraged to eat her food at meals and to drink her supplements.   Nutrition Focused Physical Exam:  Subcutaneous Fat:  Orbital Region: N/A Upper Arm Region: Severe depletion Thoracic and Lumbar Region: Severe depletion  Muscle:  Temple Region: Severe depletion Clavicle Bone Region: Severe depletion Clavicle and Acromion Bone Region: Severe depletion Scapular Bone Region: N/A Dorsal Hand: Severe depletion Patellar Region: Severe depletion Anterior Thigh Region: Severe depletion Posterior Calf Region: Severe depletion  Edema: none  Labs and medications reviewed.  Height: Ht Readings from Last 1 Encounters:   01/17/15 4\' 10"  (1.473 m)    Weight: Wt Readings from Last 1 Encounters:  01/31/15 79 lb (35.834 kg)  04/11/15 90 lbs (40.9 kg)  Ideal Body Weight: 96 lbs  % Ideal Body Weight: 94%  Wt Readings from Last 10 Encounters:  01/31/15 79 lb (35.834 kg)  01/17/15 79 lb 6 oz (36.004 kg)  01/11/15 79 lb 9.6 oz (36.106 kg)  11/16/14 78 lb (35.381 kg)  11/05/14 77 lb 2.6 oz (35 kg)  04/26/14 85 lb (38.556 kg)  03/04/14 90 lb 3.2 oz (40.914 kg)  02/15/14 88 lb 6.4 oz (40.098 kg)  12/08/13 83 lb 3.2 oz (37.739 kg)  12/05/13 81 lb 9.1 oz (37 kg)    Usual Body Weight: 93 lbs  % Usual Body Weight: 97%  BMI:  Body Mass Index: 18.94 kg/(m^2)  Estimated Nutritional Needs: Kcal: 1300-1550 Protein: 55-70 grams Fluid: >/= 1.5 L/day  Skin: Intact  Diet Order: DIET DYS 3 Room service appropriate?: Yes; Fluid consistency:: Thin  EDUCATION NEEDS: -No education needs identified at this time   Intake/Output Summary (Last 24 hours) at 04/11/15 1140 Last data filed at 04/11/15 0900  Gross per 24 hour  Intake    240 ml  Output     50 ml  Net    190 ml    Last BM: 4/10  Labs:   Recent Labs Lab 04/10/15 1353  NA 139  K 3.7  CL 100  CO2 30  BUN 13  CREATININE 0.53  CALCIUM 9.4  GLUCOSE 102*    CBG (last 3)  No results for input(s): GLUCAP in the last 72 hours.  Scheduled Meds: . azithromycin  500  mg Intravenous Q24H  . cefTRIAXone (ROCEPHIN)  IV  1 g Intravenous Q24H  . cholecalciferol  1,000 Units Oral Daily  . docusate sodium  200 mg Oral BID  . enoxaparin (LOVENOX) injection  30 mg Subcutaneous Q24H  . [START ON 04/13/2015] fentaNYL  12.5 mcg Transdermal Q72H  . gabapentin  100 mg Oral QHS  . gabapentin  200 mg Oral 2 times per day  . ipratropium-albuterol  3 mL Nebulization Q6H  . lubiprostone  24 mcg Oral BID WC  . methylPREDNISolone (SOLU-MEDROL) injection  40 mg Intravenous Q12H  . omega-3 acid ethyl esters  2 g Oral Daily  . oxybutynin  10 mg Oral QHS  .  pantoprazole  40 mg Oral Daily  . pramipexole  0.5 mg Oral QHS  . sodium chloride  3 mL Intravenous Q12H    Continuous Infusions:   Past Medical History  Diagnosis Date  . GERD (gastroesophageal reflux disease)   . Bladder incontinence   . Neuropathic pain   . RLS (restless legs syndrome)   . Arthritis   . LBBB (left bundle branch block)     Past Surgical History  Procedure Laterality Date  . Tonsillectomy    . Abdominal hysterectomy      TVH  . Incontinence surgery    . Excision morton's neuroma    . Eye surgery      cataracts  . Amputation  04/30/2012    Procedure: AMPUTATION DIGIT;  Surgeon: Colin Rhein, MD;  Location: [facility];  Service: Orthopedics;  Laterality: Right;  right 2nd toe amptation through MTP joint    Kallie Locks, MS, RD, LDN Pager # [phone] After hours/ weekend pager # [phone]

## 2015-04-11 NOTE — Progress Notes (Signed)
Replaced gauze dx w/3 foam dx's to cover red, bony areas on med/low back present on admission.

## 2015-04-11 NOTE — Evaluation (Signed)
Physical Therapy Evaluation Patient Details Name: Kerry Myers MRN: [id number] DOB: 05/06/1919 Today's Date: 04/11/2015   History of Present Illness  Pt is a 81 y/o F w/ community acquired pneumonia.  Pt's PMH includes chronic respiratory failure due to restrictive lung disease, dyspnea, use of 2L O2 at baseline, LBBB, restless leg syndrome, neuropathic pain, and bladder incontinence.  Clinical Impression  Pt admitted with above diagnosis. Pt currently with functional limitations due to the deficits listed below (see PT Problem List).  Ambulatory distance limited by pt's report of fatigue after amb 8 ft to recliner chair and back to bed. Pt will benefit from skilled PT to increase their independence and safety with mobility to allow discharge to the venue listed below.  Currently recommending return to home upon d/c as pt demonstrates safe use of RW and has assist at home.    Follow Up Recommendations No PT follow up;Supervision - Intermittent    Equipment Recommendations  None recommended by PT    Recommendations for Other Services       Precautions / Restrictions Precautions Precautions: Fall Restrictions Weight Bearing Restrictions: No      Mobility  Bed Mobility Overal bed mobility: Needs Assistance Bed Mobility: Supine to Sit;Sit to Supine     Supine to sit: Modified independent (Device/Increase time);HOB elevated Sit to supine: Modified independent (Device/Increase time)   General bed mobility comments: Increased time and use of rails  Transfers Overall transfer level: Needs assistance Equipment used: Rolling walker (2 wheeled) Transfers: Sit to/from Stand Sit to Stand: Min assist         General transfer comment: Min assist w/ runk anteriorly w/ pt demonstrating max trunk flexion prior to verbal cues to stand upright  Ambulation/Gait Ambulation/Gait assistance: Min guard Ambulation Distance (Feet): 8 Feet Assistive device: Rolling walker (2 wheeled) Gait  Pattern/deviations: Step-through pattern;Decreased stride length;Shuffle;Antalgic;Trunk flexed Gait velocity: decreased Gait velocity interpretation: Below normal speed for age/gender General Gait Details: Pt with trunk flexed and dec foot clearance B.  Pt navigated RW well and safely w/o verbal cues.  Pt verbalized fatigue after ambulating 8 ft.  Stairs            Wheelchair Mobility    Modified Rankin (Stroke Patients Only)       Balance                                             Pertinent Vitals/Pain Pain Assessment: No/denies pain    Home Living Family/patient expects to be discharged to:: Private residence Living Arrangements: Children (two sons  (one disabled per pt's daughter)) Available Help at Discharge: Family;Available PRN/intermittently (One son is disabled and other son works) Type of Home: House Home Access: Level entry     [place]: One level;Able to live on main level with bedroom/bathroom Home Equipment: Kasandra Knudsen - single point;Bedside commode;Walker - 2 wheels      Prior Function Level of Independence: Independent with assistive device(s) (RW at all times)               Hand Dominance   Dominant Hand: Right    Extremity/Trunk Assessment               Lower Extremity Assessment: RLE deficits/detail      Cervical / Trunk Assessment: Kyphotic (Scoliosis)  Communication   Communication: HOH (did not have hearing aids in)  Cognition Arousal/Alertness: Awake/alert Behavior During Therapy: WFL for tasks assessed/performed Overall Cognitive Status: History of cognitive impairments - at baseline (Pt A&O x3)                      General Comments      Exercises Total Joint Exercises Ankle Circles/Pumps: AROM;Both;15 reps;Supine Quad Sets: AROM;Both;5 reps;Supine Heel Slides: AROM;Both;5 reps;Supine      Assessment/Plan    PT Assessment Patient needs continued PT services  PT Diagnosis Difficulty  walking;Abnormality of gait;Generalized weakness;Altered mental status   PT Problem List Decreased strength;Decreased range of motion;Decreased activity tolerance;Decreased balance;Decreased mobility;Decreased coordination;Decreased cognition;Decreased knowledge of use of DME;Decreased safety awareness;Decreased knowledge of precautions;Impaired sensation  PT Treatment Interventions DME instruction;Gait training;Functional mobility training;Therapeutic activities;Therapeutic exercise;Balance training;Neuromuscular re-education;Cognitive remediation;Patient/family education   PT Goals (Current goals can be found in the Care Plan section) Acute Rehab PT Goals Patient Stated Goal: to go home PT Goal Formulation: With patient/family Time For Goal Achievement: 04/18/15 Potential to Achieve Goals: Good    Frequency Min 2X/week   Barriers to discharge Decreased caregiver support Assist intermittent at home    Co-evaluation               End of Session Equipment Utilized During Treatment: Gait belt Activity Tolerance: Patient tolerated treatment well;Patient limited by fatigue Patient left: in bed;with call bell/phone within reach;with bed alarm set;with family/visitor present;with SCD's reapplied Nurse Communication: Mobility status;Precautions         Time: 9767-3419 PT Time Calculation (min) (ACUTE ONLY): 28 min   Charges:   PT Evaluation $Initial PT Evaluation Tier I: 1 Procedure PT Treatments $Therapeutic Exercise: 8-22 mins   PT G Codes:       Joslyn Hy PT, DPT [phone] Pager: [phone] 04/11/2015, 1:36 PM

## 2015-04-12 LAB — LEGIONELLA ANTIGEN, URINE

## 2015-04-12 MED ORDER — IPRATROPIUM-ALBUTEROL 0.5-2.5 (3) MG/3ML IN SOLN
3.0000 mL | Freq: Three times a day (TID) | RESPIRATORY_TRACT | Status: DC
  Administered 2015-04-12 – 2015-04-13 (×5): 3 mL via RESPIRATORY_TRACT
  Filled 2015-04-12 (×5): qty 3

## 2015-04-12 NOTE — Progress Notes (Signed)
[facility] PROGRESS NOTE  Kerry Myers PYK:[id number] DOB: 19-Feb-1919 DOA: 04/10/2015 PCP: Garnet Koyanagi, DO  Assessment/Plan: Acute on chronic respiratory failure  secondary to pneumonia in the setting of restrictive lung disease due to scoliosis - Will continue supplemental oxygen (patient on 2 liters at home) - continue current regimen  Community-acquired pneumonia Left lower lobe infiltrate seen on x-ray - Continue azithromycin and Rocephin, scheduled nebulizers, low-dose Solu-Medrol. Wil continue - Urinary antigen for Legionella and strep pneumoniae pending  Left great toe ulcer -Wound care consultated  Dilated left pupil EDP has ordered CT head. Reports no acute intracranial abnormalities Neuro exam is otherwise nonfocal.  Peripheral neuropathy Continue gabapentin  Severe constipation due to opioids Colace twice a day, Amitiza twice a day, milk of magnesia when necessary, enema when necessary  Chronic pain Will continue home pain medications at the same dosages. Including fentanyl patch  GERD Continue PPI   Code Status: DO NOT RESUSCITATE Family Communication: No family at bedside Disposition Plan: With continued improvement most likely discharge next a.m.   Consultants:  None  Procedures:  None  Antibiotics:  Azithromycin and Rocephin  HPI/Subjective: Pt has no new complaints. No acute issues overnight. States she feels better  Objective: Filed Vitals:   04/12/15 1225  BP: 146/62  Pulse: 57  Temp: 98.4 F (36.9 C)  Resp: 18    Intake/Output Summary (Last 24 hours) at 04/12/15 1606 Last data filed at 04/12/15 1559  Gross per 24 hour  Intake   1115 ml  Output      0 ml  Net   1115 ml   There were no vitals filed for this visit.  Exam:   General:  Pt in nad, alert and awake  Cardiovascular: rrr, no mrg  Respiratory: cta bl, no wheezes, no increased wob on 4 L [place]  Abdomen: soft, NT, ND  Musculoskeletal:  No cyanosis or  clubbing   Data Reviewed: Basic Metabolic Panel:  Recent Labs Lab 04/10/15 1353  NA 139  K 3.7  CL 100  CO2 30  GLUCOSE 102*  BUN 13  CREATININE 0.53  CALCIUM 9.4   Liver Function Tests: No results for input(s): AST, ALT, ALKPHOS, BILITOT, PROT, ALBUMIN in the last 168 hours. No results for input(s): LIPASE, AMYLASE in the last 168 hours. No results for input(s): AMMONIA in the last 168 hours. CBC:  Recent Labs Lab 04/10/15 1353 04/11/15 0640  WBC 6.3 4.8  NEUTROABS 4.9  --   HGB 11.7* 10.6*  HCT 37.0 32.6*  MCV 95.4 92.9  PLT 204 203   Cardiac Enzymes: No results for input(s): CKTOTAL, CKMB, CKMBINDEX, TROPONINI in the last 168 hours. BNP (last 3 results) No results for input(s): BNP in the last 8760 hours.  ProBNP (last 3 results) No results for input(s): PROBNP in the last 8760 hours.  CBG: No results for input(s): GLUCAP in the last 168 hours.  No results found for this or any previous visit (from the past 240 hour(s)).   Studies: Ct Head Wo Contrast  04/10/2015   CLINICAL DATA:  Dizziness. Shortness of breath. Lethargy for 1 week. Decreased appetite.  EXAM: CT HEAD WITHOUT CONTRAST  TECHNIQUE: Contiguous axial images were obtained from the base of the skull through the vertex without intravenous contrast.  COMPARISON:  None.  FINDINGS: No mass lesion, mass effect, midline shift, hydrocephalus, hemorrhage. No acute territorial cortical ischemia/infarct. Atrophy and chronic ischemic white matter disease is present. Intracranial atherosclerosis is present. Hyperostosis of the skull.  The head is rotated to the LEFT relative to the cervical spine.  IMPRESSION: Atrophy and chronic ischemic white matter disease without acute intracranial abnormality.   Electronically Signed   By: Dereck Ligas M.D.   On: 04/10/2015 17:18    Scheduled Meds: . azithromycin  500 mg Intravenous Q24H  . cefTRIAXone (ROCEPHIN)  IV  1 g Intravenous Q24H  . cholecalciferol  1,000 Units  Oral Daily  . docusate sodium  200 mg Oral BID  . enoxaparin (LOVENOX) injection  30 mg Subcutaneous Q24H  . feeding supplement (ENSURE ENLIVE)  237 mL Oral BID BM  . [START ON 04/13/2015] fentaNYL  12.5 mcg Transdermal Q72H  . gabapentin  100 mg Oral QHS  . gabapentin  200 mg Oral 2 times per day  . ipratropium-albuterol  3 mL Nebulization TID  . lubiprostone  24 mcg Oral BID WC  . methylPREDNISolone (SOLU-MEDROL) injection  40 mg Intravenous Q12H  . omega-3 acid ethyl esters  2 g Oral Daily  . oxybutynin  10 mg Oral QHS  . pantoprazole  40 mg Oral Daily  . pramipexole  0.5 mg Oral QHS  . sodium chloride  3 mL Intravenous Q12H   Continuous Infusions:    Time spent: > 35 minutes    Velvet Bathe  [facility] Pager [phone] If 7PM-7AM, please contact night-coverage at [email or web address], password [facility] 04/12/2015, 4:06 PM  LOS: 2 days

## 2015-04-12 NOTE — Progress Notes (Signed)
Report obtained from [place] at this time.  Assuming care of pt at this time.

## 2015-04-12 NOTE — Progress Notes (Signed)
Speech Language Pathology Treatment: Dysphagia  Patient Details Name: CONSETTA COSNER MRN: [id number] DOB: 08-06-1919 Today's Date: 04/12/2015 Time: 1624-4695 SLP Time Calculation (min) (ACUTE ONLY): 12 min  Assessment / Plan / Recommendation Clinical Impression  Pt seen for f/u dysphagia treatment. SLP provided skilled observation of thin liquid intake via straw, with delayed cough noted x2 across all trials. Min cues were provided for smaller sips and appropriate pacing to increase safety. She remains afebrile. Will continue to follow for tolerance and readiness for advanced solid POs.   HPI HPI: Katheleen Stella is a 81 y.o. female who presens with SOB, lethargy, dizziness, and increased oxygen requirements (up to 6L by [place], baseline 2L). CXR shows possible COPD execerbation and LLL infiltrate. CT Head withuot acute changes. PMH significant for chronic respiratory failure on 2 L of oxygen at home, left bundle branch block, GERD, neuropathic pain, scoliosis, and severe constipation. Previous MBS 02/2013 recommending regular diet and thin liquids.   Pertinent Vitals Pain Assessment: No/denies pain  SLP Plan  Continue with current plan of care    Recommendations Diet recommendations: Dysphagia 3 (mechanical soft);Thin liquid Liquids provided via: Cup;Straw Medication Administration: Whole meds with puree Supervision: Patient able to self feed;Intermittent supervision to cue for compensatory strategies Compensations: Slow rate;Small sips/bites Postural Changes and/or Swallow Maneuvers: Seated upright 90 degrees;Upright 30-60 min after meal       Oral Care Recommendations: Oral care BID Follow up Recommendations:  (tba) Plan: Continue with current plan of care    Germain Osgood, M.A. CCC-SLP [phone]  Germain Osgood 04/12/2015, 11:49 AM

## 2015-04-12 NOTE — Progress Notes (Signed)
Occupational Therapy Evaluation Patient Details Name: Kerry Myers MRN: [id number] DOB: April 06, 1919 Today's Date: 04/12/2015    History of Present Illness Pt is a 81 y/o F w/ community acquired pneumonia.  Pt's PMH includes chronic respiratory failure due to restrictive lung disease, dyspnea, use of 2L O2 at baseline, LBBB, restless leg syndrome, neuropathic pain, and bladder incontinence.   Clinical Impression   PTA, pt had PCA who assisted with ADL and mobility as needed. Daughter states her mother has been hallucinating since in the hospital and has seemed to have increased confusion. After conversation, daughter states that pt has had increased ongoing confusion since last fall. Daughter asking if pt would qualify for hospice services. Will follow pt acutely to facilitate safe D/C home. Pt will need 2/7 S to D/C home. Daughter aware.     Follow Up Recommendations  No OT follow up;Supervision/Assistance - 24 hour    Equipment Recommendations  None recommended by OT    Recommendations for Other Services       Precautions / Restrictions Precautions Precautions: Fall Restrictions Weight Bearing Restrictions: No      Mobility Bed Mobility Overal bed mobility: Needs Assistance Bed Mobility: Sit to Supine     Supine to sit: Supervision        Transfers Overall transfer level: Needs assistance Equipment used: Rolling walker (2 wheeled) Transfers: Sit to/from [facility] Sit to Stand: Min guard Stand pivot transfers: Min assist       General transfer comment: with increased time    Balance     Sitting balance-Leahy Scale: Good       Standing balance-Leahy Scale: Fair                              ADL Overall ADL's : Needs assistance/impaired Eating/Feeding: Set up   Grooming: Min guard;Standing   Upper Body Bathing: Minimal assitance;Sitting   Lower Body Bathing: Sit to/from stand;Moderate assistance   Upper Body Dressing :  Sitting;Moderate assistance   Lower Body Dressing: Moderate assistance;Sit to/from stand   Toilet Transfer: Minimal assistance;Ambulation;RW   Toileting- Clothing Manipulation and Hygiene: Minimal assistance;Cueing for safety;Cueing for sequencing;Sit to/from Nurse, children's Details (indicate cue type and reason): Daughter states pt steps over tub at home while holding onto grab bars. Recommeded that pt use her tub bench and swing legs over to reduce risk of falls. Functional mobility during ADLs: Minimal assistance;Cueing for safety;Cueing for sequencing;Rolling walker General ADL Comments: Daughter feels pt is slightly below baseline funcitnoal level.                      Pertinent Vitals/Pain  no c/o pain. Vitals stable.     Hand Dominance Right   Extremity/Trunk Assessment Upper Extremity Assessment Upper Extremity Assessment: Generalized weakness   Lower Extremity Assessment Lower Extremity Assessment: Defer to PT evaluation;Generalized weakness   Cervical / Trunk Assessment Cervical / Trunk Assessment: Kyphotic   Communication Communication Communication: HOH   Cognition Arousal/Alertness: Awake/alert Behavior During Therapy: WFL for tasks assessed/performed Overall Cognitive Status: Impaired/Different from baseline Area of Impairment: Orientation;Memory;Safety/judgement;Awareness Orientation Level: Disoriented to;Time   Memory: Decreased short-term memory   Safety/Judgement: Decreased awareness of safety Awareness: Emergent   General Comments: Daughter reports pt hallucinating   General Comments                    Home Living Family/patient expects to  be discharged to:: Private residence Living Arrangements: Children Available Help at Discharge: Family;Personal care attendant;Available 24 hours/day Type of Home: House Home Access: Level entry     Home Layout: One level;Able to live on main level with bedroom/bathroom      Bathroom Shower/Tub: Tub/shower unit Shower/tub characteristics: Architectural technologist: Standard Bathroom Accessibility: Yes How Accessible: Accessible via walker Home Equipment: [place] - single point;Bedside commode;Walker - 2 wheels;Tub bench   Additional Comments: PTA, had PCA to assist with ADL and 2 sons. Pt was left alone for short periods of time.      Prior Functioning/Environment Level of Independence: Needs assistance;Independent with assistive device(s)  Gait / Transfers Assistance Needed: S ADL's / Homemaking Assistance Needed: PCA assisted with care        OT Diagnosis: Generalized weakness   OT Problem List: Decreased strength;Decreased activity tolerance;Impaired balance (sitting and/or standing);Decreased knowledge of use of DME or AE;Decreased safety awareness;Decreased cognition;Decreased coordination   OT Treatment/Interventions: Self-care/ADL training;Therapeutic exercise;Energy conservation;DME and/or AE instruction;Therapeutic activities;Cognitive remediation/compensation;Patient/family education;Balance training    OT Goals(Current goals can be found in the care plan section) Acute Rehab OT Goals Patient Stated Goal: to get out of the hospital OT Goal Formulation: With patient Time For Goal Achievement: 04/26/15 Potential to Achieve Goals: Good  OT Frequency: Min 2X/week   Barriers to D/C:            Co-evaluation              End of Session Equipment Utilized During Treatment: Gait belt;Rolling walker Nurse Communication: Mobility status  Activity Tolerance: Patient tolerated treatment well Patient left: in chair;with call bell/phone within reach;with family/visitor present   Time: 1630-1650 OT Time Calculation (min): 20 min Charges:  OT General Charges $OT Visit: 1 Procedure OT Evaluation $Initial OT Evaluation Tier I: 1 Procedure G-Codes:    Lot Medford,HILLARY Apr 18, 2015, 5:29 PM   [facility], OTR/L  [phone] Apr 18, 2015

## 2015-04-12 NOTE — Progress Notes (Signed)
Physical Therapy Treatment Patient Details Name: Kerry Myers MRN: [id number] DOB: 1919/06/08 Today's Date: 04/12/2015    History of Present Illness Pt is a 81 y/o F w/ community acquired pneumonia.  Pt's PMH includes chronic respiratory failure due to restrictive lung disease, dyspnea, use of 2L O2 at baseline, LBBB, restless leg syndrome, neuropathic pain, and bladder incontinence.    PT Comments    Patient is making good progress with PT.  Pt will benefit from continued skilled PT services 2/2 pt's diagnosis of pneumonia.     Follow Up Recommendations  No PT follow up;Supervision - Intermittent     Equipment Recommendations  None recommended by PT    Recommendations for Other Services       Precautions / Restrictions Precautions Precautions: Fall Restrictions Weight Bearing Restrictions: No    Mobility  Bed Mobility Overal bed mobility: Needs Assistance Bed Mobility: Sit to Supine       Sit to supine: Min assist   General bed mobility comments: Increased time and daughter assist w/ BLEs into bed  Transfers Overall transfer level: Needs assistance Equipment used: Rolling walker (2 wheeled) Transfers: Sit to/from Stand Sit to Stand: Min guard         General transfer comment: Pt required max increased time but was able to perform sit>stand w/ min guard.  Verbal cues to scoot toward edge of seat to assist w/ sit>stand.  Ambulation/Gait Ambulation/Gait assistance: Min guard Ambulation Distance (Feet): 60 Feet Assistive device: Rolling walker (2 wheeled) Gait Pattern/deviations: Step-through pattern;Decreased stride length;Shuffle;Antalgic;Trunk flexed Gait velocity: decreased Gait velocity interpretation: Below normal speed for age/gender General Gait Details: Pt w/ trunk flexed (2/2 scoliosis) w/ verbal cues to stand upright to tolerance.  Verbal cues to keep RW close to her body while ambulating.   Stairs            Wheelchair Mobility     Modified Rankin (Stroke Patients Only)       Balance Overall balance assessment: Needs assistance Sitting-balance support: No upper extremity supported;Feet supported Sitting balance-Leahy Scale: Good     Standing balance support: Single extremity supported Standing balance-Leahy Scale: Fair                      Cognition Arousal/Alertness: Awake/alert Behavior During Therapy: WFL for tasks assessed/performed Overall Cognitive Status: History of cognitive impairments - at baseline                      Exercises Total Joint Exercises Long Arc Quad: AROM;Both;10 reps;Seated General Exercises - Upper Extremity Shoulder Flexion: AROM;10 reps;Both;Seated    General Comments        Pertinent Vitals/Pain Pain Assessment: No/denies pain    Home Living                      Prior Function            PT Goals (current goals can now be found in the care plan section) Acute Rehab PT Goals Patient Stated Goal: to get back in bed to take a nap PT Goal Formulation: With patient/family Time For Goal Achievement: 04/18/15 Potential to Achieve Goals: Good Progress towards PT goals: Progressing toward goals    Frequency  Min 2X/week    PT Plan Current plan remains appropriate    Co-evaluation             End of Session Equipment Utilized During Treatment: Gait belt;Oxygen (2 LPM) Activity  Tolerance: Patient tolerated treatment well Patient left: in bed;with call bell/phone within reach;with family/visitor present     Time: 5916-3846 PT Time Calculation (min) (ACUTE ONLY): 18 min  Charges:  $Gait Training: 8-22 mins                    G Codes:      Kerry Myers PT, [place] [phone] Pager: [phone]  04/12/2015, 10:55 AM

## 2015-04-13 DIAGNOSIS — J9611 Chronic respiratory failure with hypoxia: Secondary | ICD-10-CM

## 2015-04-13 MED ORDER — CEFDINIR 300 MG PO CAPS: 300.0000 mg | ORAL_CAPSULE | Freq: Two times a day (BID) | ORAL | Status: DC

## 2015-04-13 MED ORDER — ENSURE ENLIVE PO LIQD: 237.0000 mL | Freq: Two times a day (BID) | ORAL | Status: DC

## 2015-04-13 MED ORDER — AZITHROMYCIN 500 MG PO TABS: 500.0000 mg | ORAL_TABLET | Freq: Every day | ORAL | Status: DC

## 2015-04-13 MED ORDER — PREDNISONE 50 MG PO TABS: ORAL_TABLET | ORAL | Status: DC

## 2015-04-13 NOTE — Discharge Summary (Signed)
Physician Discharge Summary  TAMU GOLZ RKY:[id number] DOB: 25-Mar-1919 DOA: 04/10/2015  PCP: Garnet Koyanagi, DO  Admit date: 04/10/2015 Discharge date: 04/13/2015  Time spent: > 35 minutes  Recommendations for Outpatient Follow-up:  1. Be discharged on short course of steroid and antibiotic regimen  Discharge Diagnoses:  Principal Problem:   Acute on chronic respiratory failure Active Problems:   RESTLESS LEG SYNDROME   Hereditary and idiopathic peripheral neuropathy   Osteoarthritis   Osteoporosis   SCOLIOSIS, THORACOLUMBAR   GERD   PNA (pneumonia)   CAP (community acquired pneumonia)   Protein-calorie malnutrition, severe   At high risk for falls   Constipation due to opioid therapy   Toe ulcer   Pupil dilation   Respiratory failure with hypoxia   Discharge Condition: stable  Diet recommendation: Dysphagia 3  There were no vitals filed for this visit.  History of present illness:  81 yo F with chronic respiratory failure due to restrictive lung disease presented with dyspnea, found to have possible PNA on CXR. She has increased oxygen requirements from 2L to 6L  in the ED. She wil lbe admitted for IV antibiotics with Ceftriaxone and Azithromycin  Hospital Course:  Acute on chronic respiratory failure  secondary to pneumonia in the setting of restrictive lung disease due to scoliosis - Will continue supplemental oxygen (patient on 2 liters at home) on d/c patient breathing comfortably on 3-4 liters - continue short course of antibiotics (5 days treatment regimen) and steroid regimen. - improved on antibiotic regimen and steroid administration  Community-acquired pneumonia Left lower lobe infiltrate seen on x-ray - Continue azithromycin and 3rd generation cephalosporin on discharge. Will treat for 5 day course.  - Urinary antigen for Legionella and strep pneumoniae negative - Influenza negative  Left great toe ulcer -Wound care consultated while patient in  house. Patient to continue getting this evaluated by outpatient PCP  Dilated left pupil EDP has ordered CT head. Reports no acute intracranial abnormalities Neuro exam is otherwise nonfocal.  Peripheral neuropathy Continue gabapentin  Severe constipation due to opioids Patient to continue home regimen on discharge  Chronic pain Will continue home pain medications at the same dosages. Including fentanyl patch  GERD Continue PPI   Procedures:  None  Consultations:  None  Discharge Exam: Filed Vitals:   04/13/15 0526  BP: 122/58  Pulse: 62  Temp: 97.6 F (36.4 C)  Resp: 18    General: Patient in no acute distress, alert and awake Cardiovascular: Regular rate and rhythm, no murmurs rubs Respiratory: No increased work of breathing. Breathing comfortably on supplemental oxygen, no wheezes, equal chest rise  Discharge Instructions   Discharge Instructions    Call MD for:  difficulty breathing, headache or visual disturbances    Complete by:  As directed      Call MD for:  extreme fatigue    Complete by:  As directed      Call MD for:  severe uncontrolled pain    Complete by:  As directed      Call MD for:  temperature >100.4    Complete by:  As directed      Diet - low sodium heart healthy    Complete by:  As directed      Discharge instructions    Complete by:  As directed   Recommend following up with your primary care physician in 1-2 weeks or sooner should any new concerns arise.     Increase activity slowly    Complete  by:  As directed           Current Discharge Medication List    START taking these medications   Details  azithromycin (ZITHROMAX) 500 MG tablet Take 1 tablet (500 mg total) by mouth daily. Qty: 2 tablet, Refills: 0    cefdinir (OMNICEF) 300 MG capsule Take 1 capsule (300 mg total) by mouth 2 (two) times daily. Qty: 4 capsule, Refills: 0    feeding supplement, ENSURE ENLIVE, (ENSURE ENLIVE) LIQD Take 237 mLs by mouth 2 (two) times  daily between meals. Qty: 237 mL, Refills: 12    predniSONE (DELTASONE) 50 MG tablet Take one tablet by mouth daily Qty: 2 tablet, Refills: 0      CONTINUE these medications which have NOT CHANGED   Details  albuterol (PROVENTIL HFA;VENTOLIN HFA) 108 (90 BASE) MCG/ACT inhaler Inhale 2 puffs into the lungs every 6 (six) hours as needed for wheezing. Qty: 1 Inhaler, Refills: 0    Calcium Carb-Cholecalciferol (CALCIUM 600 + D PO) Take 1 tablet by mouth 2 (two) times daily.    Cholecalciferol (VITAMIN D3) 5000 UNITS CAPS Take 1 capsule by mouth daily.    Docusate Calcium (STOOL SOFTENER PO) Take 2 tablets by mouth 2 (two) times daily.    fentaNYL (DURAGESIC - DOSED MCG/HR) 12 MCG/HR Place 1 patch (12.5 mcg total) onto the skin every 3 (three) days. Qty: 10 patch, Refills: 0   Associated Diagnoses: Chronic pain syndrome    gabapentin (NEURONTIN) 100 MG capsule Take 2 capsules by mouth  every morning , 2 capsules  by mouth at 2pm and 1  capsule at bedtime Qty: 450 capsule, Refills: 3    HYDROcodone-acetaminophen (NORCO) 5-325 MG per tablet Take 1 tablet by mouth every 8 (eight) hours as needed for moderate pain. Qty: 30 tablet, Refills: 0    L-Methylfolate-B6-B12 (FOLTANX) 3-35-2 MG TABS Take 1 tablet by mouth two  times daily Qty: 180 tablet, Refills: 1    magnesium hydroxide (MILK OF MAGNESIA) 400 MG/5ML suspension Take 30 mLs by mouth daily as needed for mild constipation.     Multiple Vitamin (MULTIVITAMIN WITH MINERALS) TABS Take 1 tablet by mouth every morning.     Omega-3 Fatty Acids (FISH OIL) 1000 MG CPDR Take 2,000 mg by mouth daily with lunch.     omeprazole (PRILOSEC) 40 MG capsule Take 1 capsule (40 mg total) by mouth at bedtime. Qty: 90 capsule, Refills: 3   Associated Diagnoses: Gastroesophageal reflux disease without esophagitis    oxybutynin (DITROPAN-XL) 10 MG 24 hr tablet Take 1 tablet by mouth  daily Qty: 90 tablet, Refills: 1    pramipexole (MIRAPEX) 0.5 MG  tablet Take 1 tablet by mouth at  bedtime Qty: 90 tablet, Refills: 3    traMADol (ULTRAM) 50 MG tablet Take 1 tablet (50 mg total) by mouth 2 (two) times daily as needed. Qty: 180 tablet, Refills: 2    zoster vaccine live, PF, (ZOSTAVAX) 41937 UNT/0.65ML injection Inject 19,400 Units into the skin once. Qty: 1 vial, Refills: 0   Associated Diagnoses: Need for shingles vaccine       No Known Allergies    The results of significant diagnostics from this hospitalization (including imaging, microbiology, ancillary and laboratory) are listed below for reference.    Significant Diagnostic Studies: Dg Chest 2 View  04/10/2015   CLINICAL DATA:  Intermittent shortness of breath, fatigue, loss of appetite this week  EXAM: CHEST  2 VIEW  COMPARISON:  No prior exams are available  for comparison  FINDINGS: Kyphotic positioning.  Normal heart size, mediastinal contours and pulmonary vascularity for technique.  Tortuosity of thoracic aorta.  Emphysematous changes with central peribronchial thickening.  Mild atelectasis versus infiltrate in LEFT lower lobe.  Minimal atelectasis at RIGHT base.  Upper lungs clear.  No pleural effusion or definite pneumothorax.  Bones demineralized.  IMPRESSION: COPD changes with atelectasis versus consolidation in LEFT lower lobe and minimal RIGHT basilar atelectasis.   Electronically Signed   By: Lavonia Dana M.D.   On: 04/10/2015 15:09   Ct Head Wo Contrast  04/10/2015   CLINICAL DATA:  Dizziness. Shortness of breath. Lethargy for 1 week. Decreased appetite.  EXAM: CT HEAD WITHOUT CONTRAST  TECHNIQUE: Contiguous axial images were obtained from the base of the skull through the vertex without intravenous contrast.  COMPARISON:  None.  FINDINGS: No mass lesion, mass effect, midline shift, hydrocephalus, hemorrhage. No acute territorial cortical ischemia/infarct. Atrophy and chronic ischemic white matter disease is present. Intracranial atherosclerosis is present. Hyperostosis of  the skull. The head is rotated to the LEFT relative to the cervical spine.  IMPRESSION: Atrophy and chronic ischemic white matter disease without acute intracranial abnormality.   Electronically Signed   By: Dereck Ligas M.D.   On: 04/10/2015 17:18    Microbiology: No results found for this or any previous visit (from the past 240 hour(s)).   Labs: Basic Metabolic Panel:  Recent Labs Lab 04/10/15 1353  NA 139  K 3.7  CL 100  CO2 30  GLUCOSE 102*  BUN 13  CREATININE 0.53  CALCIUM 9.4   Liver Function Tests: No results for input(s): AST, ALT, ALKPHOS, BILITOT, PROT, ALBUMIN in the last 168 hours. No results for input(s): LIPASE, AMYLASE in the last 168 hours. No results for input(s): AMMONIA in the last 168 hours. CBC:  Recent Labs Lab 04/10/15 1353 04/11/15 0640  WBC 6.3 4.8  NEUTROABS 4.9  --   HGB 11.7* 10.6*  HCT 37.0 32.6*  MCV 95.4 92.9  PLT 204 203   Cardiac Enzymes: No results for input(s): CKTOTAL, CKMB, CKMBINDEX, TROPONINI in the last 168 hours. BNP: BNP (last 3 results) No results for input(s): BNP in the last 8760 hours.  ProBNP (last 3 results) No results for input(s): PROBNP in the last 8760 hours.  CBG: No results for input(s): GLUCAP in the last 168 hours.     Signed:  Velvet Bathe  [facility] 04/13/2015, 11:29 AM

## 2015-04-14 ENCOUNTER — Telehealth: Payer: Self-pay | Admitting: *Deleted

## 2015-04-14 ENCOUNTER — Encounter (HOSPITAL_COMMUNITY): Payer: Self-pay | Admitting: Emergency Medicine

## 2015-04-14 ENCOUNTER — Emergency Department (HOSPITAL_COMMUNITY)
Admission: EM | Admit: 2015-04-14 | Discharge: 2015-04-15 | Disposition: A | Discharge status: Home / Self Care | Payer: Medicare Other | Attending: Emergency Medicine | Admitting: Emergency Medicine

## 2015-04-14 DIAGNOSIS — M1990 Unspecified osteoarthritis, unspecified site: Secondary | ICD-10-CM | POA: Insufficient documentation

## 2015-04-14 DIAGNOSIS — Z8669 Personal history of other diseases of the nervous system and sense organs: Secondary | ICD-10-CM | POA: Diagnosis not present

## 2015-04-14 DIAGNOSIS — Z8679 Personal history of other diseases of the circulatory system: Secondary | ICD-10-CM | POA: Diagnosis not present

## 2015-04-14 DIAGNOSIS — J9611 Chronic respiratory failure with hypoxia: Secondary | ICD-10-CM | POA: Insufficient documentation

## 2015-04-14 DIAGNOSIS — Z8701 Personal history of pneumonia (recurrent): Secondary | ICD-10-CM | POA: Diagnosis not present

## 2015-04-14 DIAGNOSIS — Z79899 Other long term (current) drug therapy: Secondary | ICD-10-CM | POA: Diagnosis not present

## 2015-04-14 DIAGNOSIS — Z7982 Long term (current) use of aspirin: Secondary | ICD-10-CM | POA: Insufficient documentation

## 2015-04-14 DIAGNOSIS — K219 Gastro-esophageal reflux disease without esophagitis: Secondary | ICD-10-CM | POA: Insufficient documentation

## 2015-04-14 DIAGNOSIS — R0602 Shortness of breath: Secondary | ICD-10-CM | POA: Diagnosis present

## 2015-04-14 DIAGNOSIS — Z87448 Personal history of other diseases of urinary system: Secondary | ICD-10-CM | POA: Diagnosis not present

## 2015-04-14 DIAGNOSIS — R0600 Dyspnea, unspecified: Secondary | ICD-10-CM

## 2015-04-14 HISTORY — DX: Pneumonia, unspecified organism: J18.9

## 2015-04-14 NOTE — ED Notes (Signed)
Pt. arrived with EMS from home , family reported increasing SOB onset today , no cough or congestion , pt. was admitted this week for pneumonia and discharged home yesterday . Denies fever or chills. Pt. received 2 nebulizer treatment at home this evening with relief .

## 2015-04-14 NOTE — Telephone Encounter (Signed)
Transition Care Management Follow-up Telephone Call   **Call completed with patient's son   How have you been since you were released from the hospital? "Good, she is feeling better, she is just wanting to rest.  Her breathing is better, she does seem run down"    Do you understand why you were in the hospital? YES - pneumonia    Do you understand the discharge instrcutions? YES   Items Reviewed:  Medications reviewed: YES   Allergies reviewed: YES   Dietary changes reviewed: YES - no changes, ensures encouraged between meals   Referrals reviewed: YES    Functional Questionnaire:   Activities of Daily Living (ADLs):   She states they are independent in the following: She is able to walk with walker, use restroom  States they require assistance with the following: Dressing, cooking, transportation, she needs help with the oxygen concentrator when she moves from room to room,   Patient will have [place], they are supposed to arrive tomorrow   Any transportation issues/concerns?: NO    Any patient concerns? YES- in the past several weeks, the patient does not seem to have the same appetite.  Son thinks she would have more energy if she was able to eat normally.  She is drinking the ensures between small meals.      Confirmed importance and date/time of follow-up visits scheduled: YES    Confirmed with patient if condition begins to worsen call PCP or go to the ER.  Patient was given the Call-a-Nurse line [phone]: YES

## 2015-04-14 NOTE — ED Provider Notes (Signed)
CSN: [id number]     Arrival date & time 04/14/15  2300 History  This chart was scribed for Kerry Shanks, MD by Delphia Grates, ED Scribe. This patient was seen in room D30C/D30C and the patient's care was started at 11:29 PM.   Chief Complaint  Patient presents with  . Shortness of Breath    The history is provided by the patient and a relative. No language interpreter was used.     HPI Comments: Kerry Myers is a 81 y.o. female, with history of GERD, neuropathic pain, arthritis, RLS, LBBB, pneumonia, brought in by ambulance, who presents to the Emergency Department complaining of increasing SOB that began today. Patient was admitted to the hospital 4 days ago for pneumonia and discharged yesterday. Per family, patient has been having spells of SOB while in hospital and since being discharged. They report this causes the patient to have episodes of difficulty breathing and increasing agitation and panic that last approximately 20-30 minutes. They report dry mouth seems to be the initial symptom. The family states the episodes resolve with warm blankets and reporsitioning. Patient is also complaining of a swelling sensation to the BLE. Patient states the swelling sensation started prior to hospital admission. She reports it just feels like someone is blowing her legs up. It occurs in both legs but there is no actual associated swelling. Family also mentions rapid onset of symptoms when the patient sits upright. She denies history of prior similar episodes prior to about a week ago. She denies cough, congestion, chest pain, fever, chills, nausea, vomiting.   Past Medical History  Diagnosis Date  . GERD (gastroesophageal reflux disease)   . Bladder incontinence   . Neuropathic pain   . RLS (restless legs syndrome)   . Arthritis   . LBBB (left bundle branch block)   . Pneumonia    Past Surgical History  Procedure Laterality Date  . Tonsillectomy    . Abdominal hysterectomy      TVH  .  Incontinence surgery    . Excision morton's neuroma    . Eye surgery      cataracts  . Amputation  04/30/2012    Procedure: AMPUTATION DIGIT;  Surgeon: Colin Rhein, MD;  Location: [facility];  Service: Orthopedics;  Laterality: Right;  right 2nd toe amptation through MTP joint   No family history on file. History  Substance Use Topics  . Smoking status: Never Smoker   . Smokeless tobacco: Never Used  . Alcohol Use: No   OB History    No data available     Review of Systems  A complete 10 system review of systems was obtained and all systems are negative except as noted in the HPI and PMH.    Allergies  Review of patient's allergies indicates no known allergies.  Home Medications   Prior to Admission medications   Medication Sig Start Date End Date Taking? Authorizing Provider  albuterol (PROVENTIL HFA;VENTOLIN HFA) 108 (90 BASE) MCG/ACT inhaler Inhale 2 puffs into the lungs every 6 (six) hours as needed for wheezing. 03/27/13  Yes Janece Canterbury, MD  azithromycin (ZITHROMAX) 500 MG tablet Take 1 tablet (500 mg total) by mouth daily. 04/14/15  Yes Velvet Bathe, MD  Calcium Carb-Cholecalciferol (CALCIUM 600 + D PO) Take 1 tablet by mouth 2 (two) times daily.   Yes Historical Provider, MD  cefdinir (OMNICEF) 300 MG capsule Take 1 capsule (300 mg total) by mouth 2 (two) times daily. 04/14/15  Yes  Velvet Bathe, MD  Cholecalciferol (VITAMIN D3) 5000 UNITS CAPS Take 1 capsule by mouth daily.   Yes Historical Provider, MD  Docusate Calcium (STOOL SOFTENER PO) Take 2 tablets by mouth 2 (two) times daily.   Yes Historical Provider, MD  feeding supplement, ENSURE ENLIVE, (ENSURE ENLIVE) LIQD Take 237 mLs by mouth 2 (two) times daily between meals. 04/13/15  Yes Velvet Bathe, MD  fentaNYL (DURAGESIC - DOSED MCG/HR) 12 MCG/HR Place 1 patch (12.5 mcg total) onto the skin every 3 (three) days. 03/14/15  Yes Rosalita Chessman, DO  gabapentin (NEURONTIN) 100 MG capsule Take 2 capsules  by mouth  every morning , 2 capsules  by mouth at 2pm and 1  capsule at bedtime 03/28/15  Yes Rosalita Chessman, DO  HYDROcodone-acetaminophen (NORCO) 5-325 MG per tablet Take 1 tablet by mouth every 8 (eight) hours as needed for moderate pain. 01/17/15  Yes Brunetta Jeans, PA-C  L-Methylfolate-B6-B12 Lebron Quam) 3-35-2 MG TABS Take 1 tablet by mouth two  times daily 01/31/15  Yes Alferd Apa [name], DO  magnesium hydroxide (MILK OF MAGNESIA) 400 MG/5ML suspension Take 30 mLs by mouth daily as needed for mild constipation.    Yes Historical Provider, MD  Multiple Vitamin (MULTIVITAMIN WITH MINERALS) TABS Take 1 tablet by mouth every morning.    Yes Historical Provider, MD  Omega-3 Fatty Acids (FISH OIL) 1000 MG CPDR Take 2,000 mg by mouth daily with lunch.    Yes Historical Provider, MD  omeprazole (PRILOSEC) 40 MG capsule Take 1 capsule (40 mg total) by mouth at bedtime. 11/16/14  Yes Rosalita Chessman, DO  oxybutynin (DITROPAN-XL) 10 MG 24 hr tablet Take 1 tablet by mouth  daily 01/31/15  Yes Rosalita Chessman, DO  pramipexole (MIRAPEX) 0.5 MG tablet Take 1 tablet by mouth at  bedtime 01/17/15  Yes Rosalita Chessman, DO  predniSONE (DELTASONE) 50 MG tablet Take one tablet by mouth daily 04/14/15  Yes Velvet Bathe, MD  traMADol (ULTRAM) 50 MG tablet Take 1 tablet (50 mg total) by mouth 2 (two) times daily as needed. Patient taking differently: Take 50 mg by mouth 2 (two) times daily as needed (pain).  02/03/15  Yes Yvonne R [name], DO  LORazepam (ATIVAN) 0.5 MG tablet Take 1 tablet (0.5 mg total) by mouth every 8 (eight) hours as needed for anxiety. 04/15/15   Kerry Shanks, MD  zoster vaccine live, PF, (ZOSTAVAX) 56213 UNT/0.65ML injection Inject 19,400 Units into the skin once. Patient not taking: Reported on 04/15/2015 01/11/15   Alferd Apa [name], DO   BP 118/62 mmHg  Pulse 68  Temp(Src) 98.4 F (36.9 C) (Oral)  Resp 14  SpO2 100% Physical Exam  Constitutional: She is oriented to person, place, and time. No distress.   The patient is thin but well in appearance for her age. She is alert and is not in any acute respiratory distress. She is nontoxic. He is verbally interactive.  HENT:  Head: Normocephalic and atraumatic.  Nose: Nose normal.  Mouth/Throat: Oropharynx is clear and moist.  Eyes: Conjunctivae and EOM are normal. Pupils are equal, round, and reactive to light.  Neck: Neck supple. No tracheal deviation present.  Cardiovascular: Normal rate, regular rhythm, normal heart sounds and intact distal pulses.   Pulmonary/Chest: Effort normal and breath sounds normal. No respiratory distress. She has no wheezes. She has no rales.  Patient has a significantly kyphotic and deformed chest wall from advanced osteoarthritic changes.  Abdominal: Soft. Bowel sounds are normal. She  exhibits no distension. There is no tenderness.  Musculoskeletal: Normal range of motion. She exhibits no edema or tenderness.  Patient has significant kyphosis. She has a chest wall that comes on very low into her abdomen. She has a bony prominence of the left chest wall. She has significant osteoarthritic changes of the extremities.  Neurological: She is alert and oriented to person, place, and time. She has normal strength. No cranial nerve deficit. She exhibits normal muscle tone. Coordination normal. GCS eye subscore is 4. GCS verbal subscore is 5. GCS motor subscore is 6.  Skin: Skin is warm, dry and intact.  Psychiatric: She has a normal mood and affect. Her behavior is normal.  Nursing note and vitals reviewed.   ED Course  Procedures (including critical care time)  DIAGNOSTIC STUDIES: Oxygen Saturation is 100% on 2L Belt, normal by my interpretation.    COORDINATION OF CARE: At 2341 Discussed treatment plan with patient and family which includes review of past medical records. Patient and family agree.   Labs Review Labs Reviewed  COMPREHENSIVE METABOLIC PANEL - Abnormal; Notable for the following:    Glucose, Bld 113 (*)     Albumin 3.4 (*)    AST 41 (*)    GFR calc non Af Amer 77 (*)    GFR calc Af Amer 89 (*)    All other components within normal limits  BRAIN NATRIURETIC PEPTIDE - Abnormal; Notable for the following:    B Natriuretic Peptide 195.4 (*)    All other components within normal limits  CBC WITH DIFFERENTIAL/PLATELET - Abnormal; Notable for the following:    Hemoglobin 11.5 (*)    All other components within normal limits  TROPONIN I  URINALYSIS, ROUTINE W REFLEX MICROSCOPIC    Imaging Review Dg Chest 2 View  04/15/2015   CLINICAL DATA:  81 year old female with shortness of breath.  EXAM: CHEST  2 VIEW  COMPARISON:  04/10/2015  FINDINGS: Lung volumes remain low, emphysema again seen. Cardiomediastinal contours are unchanged. No pulmonary edema. Persistent but decreased atelectasis and blunting of left costophrenic angle. Mild increase in right basilar atelectasis. No pneumothorax. Scoliotic curvature and exaggerated thoracic kyphosis of the thoracic spine, bones are under mineralized.  IMPRESSION: 1. Improved left basilar aeration with persistent but diminished left basilar opacity and blunting of left costophrenic angle. 2. Mild increase in right basilar atelectasis 3. Emphysema again seen.   Electronically Signed   By: Jeb Levering M.D.   On: 04/15/2015 01:46     EKG Interpretation None      MDM   Final diagnoses:  Dyspnea  Chronic respiratory failure with hypoxia    The patient presents with episodic dyspnea that is positional particularly with sitting upright and is relieved by my members warming the patient in blankets and repositioning. By the description it sounds and there is a component of panic associated. The episode starts and it escalates with the patient becoming very distressed and then resolves with comfort measures. At this time there is no evidence of patient is in acute respiratory distress. Chest x-ray does not show CHF and cardiac enzymes are not elevated. She has  had CT chest studies done within the last year and a half that did not show any acute abnormalities. She is also had an echo done within about the last urinary half. At this time I suspect the patient has constrictive chest wall based on severe osteoarthritic changes and as well emphysema. She most likely begins feeling dyspneic based on  position and chest wall constriction and then I suspect this escalates into a panic type episode. This time I do feel patient is safe for discharge and continued home management. Her initial pneumonia diagnosis appears to be improving based on today's assessment and diagnostic studies.   Kerry Shanks, MD 04/15/15 [phone]

## 2015-04-14 NOTE — Telephone Encounter (Signed)
Patient's son is requesting refill of fentanyl patches.   Last refill 03/14/15 for 10 with 0.  Last office visit 01/31/15  She was recently hospitalized for CAP and discharged 04/12/15.  Please advise.

## 2015-04-15 ENCOUNTER — Emergency Department (HOSPITAL_COMMUNITY): Payer: Medicare Other

## 2015-04-15 ENCOUNTER — Other Ambulatory Visit: Payer: Self-pay | Admitting: Physician Assistant

## 2015-04-15 DIAGNOSIS — G894 Chronic pain syndrome: Secondary | ICD-10-CM

## 2015-04-15 DIAGNOSIS — J9611 Chronic respiratory failure with hypoxia: Secondary | ICD-10-CM | POA: Diagnosis not present

## 2015-04-15 LAB — COMPREHENSIVE METABOLIC PANEL
ALBUMIN: 3.4 g/dL — AB (ref 3.5–5.2)
ALT: 35 U/L (ref 0–35)
ANION GAP: 9 (ref 5–15)
AST: 41 U/L — AB (ref 0–37)
Alkaline Phosphatase: 67 U/L (ref 39–117)
BILIRUBIN TOTAL: 0.5 mg/dL (ref 0.3–1.2)
BUN: 15 mg/dL (ref 6–23)
CALCIUM: 9 mg/dL (ref 8.4–10.5)
CO2: 29 mmol/L (ref 19–32)
Chloride: 101 mmol/L (ref 96–112)
Creatinine, Ser: 0.59 mg/dL (ref 0.50–1.10)
GFR calc Af Amer: 89 mL/min — ABNORMAL LOW (ref >90)
GFR, EST NON AFRICAN AMERICAN: 77 mL/min — AB (ref >90)
Glucose, Bld: 113 mg/dL — ABNORMAL HIGH (ref 70–99)
Potassium: 3.7 mmol/L (ref 3.5–5.1)
Sodium: 139 mmol/L (ref 135–145)
Total Protein: 6.4 g/dL (ref 6.0–8.3)

## 2015-04-15 LAB — CBC WITH DIFFERENTIAL/PLATELET
BASOS ABS: 0 10*3/uL (ref 0.0–0.1)
Basophils Relative: 0 % (ref 0–1)
EOS ABS: 0 10*3/uL (ref 0.0–0.7)
Eosinophils Relative: 0 % (ref 0–5)
HCT: 36.4 % (ref 36.0–46.0)
Hemoglobin: 11.5 g/dL — ABNORMAL LOW (ref 12.0–15.0)
LYMPHS ABS: 1.4 10*3/uL (ref 0.7–4.0)
Lymphocytes Relative: 21 % (ref 12–46)
MCH: 29.5 pg (ref 26.0–34.0)
MCHC: 31.6 g/dL (ref 30.0–36.0)
MCV: 93.3 fL (ref 78.0–100.0)
Monocytes Absolute: 0.6 10*3/uL (ref 0.1–1.0)
Monocytes Relative: 9 % (ref 3–12)
NEUTROS PCT: 70 % (ref 43–77)
Neutro Abs: 4.6 10*3/uL (ref 1.7–7.7)
Platelets: 206 10*3/uL (ref 150–400)
RBC: 3.9 MIL/uL (ref 3.87–5.11)
RDW: 14.2 % (ref 11.5–15.5)
WBC: 6.6 10*3/uL (ref 4.0–10.5)

## 2015-04-15 LAB — URINALYSIS, ROUTINE W REFLEX MICROSCOPIC
BILIRUBIN URINE: NEGATIVE
Glucose, UA: NEGATIVE mg/dL
Hgb urine dipstick: NEGATIVE
Ketones, ur: NEGATIVE mg/dL
LEUKOCYTES UA: NEGATIVE
Nitrite: NEGATIVE
PH: 8 (ref 5.0–8.0)
Protein, ur: NEGATIVE mg/dL
Specific Gravity, Urine: 1.01 (ref 1.005–1.030)
Urobilinogen, UA: 0.2 mg/dL (ref 0.0–1.0)

## 2015-04-15 LAB — BRAIN NATRIURETIC PEPTIDE: B Natriuretic Peptide: 195.4 pg/mL — ABNORMAL HIGH (ref 0.0–100.0)

## 2015-04-15 LAB — TROPONIN I: Troponin I: <0.03 ng/mL (ref <0.031)

## 2015-04-15 MED ORDER — LORAZEPAM 0.5 MG PO TABS: 0.5000 mg | ORAL_TABLET | Freq: Three times a day (TID) | ORAL | Status: DC | PRN

## 2015-04-15 MED ORDER — FENTANYL 12 MCG/HR TD PT72: 12.5000 ug | MEDICATED_PATCH | TRANSDERMAL | Status: DC

## 2015-04-15 NOTE — Telephone Encounter (Signed)
Notified patient's son- he stated understanding.

## 2015-04-15 NOTE — Discharge Instructions (Signed)

## 2015-04-15 NOTE — Telephone Encounter (Signed)
I will grant refill.  Will be at front desk for pickup.  However, patient needs hospital follow-up with [name].

## 2015-04-19 ENCOUNTER — Telehealth: Payer: Self-pay | Admitting: Family Medicine

## 2015-04-19 NOTE — Telephone Encounter (Signed)
Called patient/ family  at [phone] [facility]) *Preferred* to follow-up and left message to return call.

## 2015-04-19 NOTE — Telephone Encounter (Signed)
[facility] - Client TELEPHONE ADVICE RECORD   [facility]     Patient Name: Kerry Myers Initial Comment Caller states mother in law was short of breath so she gave her a breathing tx and a an anxiety pill. Caller wants to make sure medications won't interact with the other meds she is on. Caller denies any symptoms at this time.  DOB: 10-01-1919      Nurse Assessment  Nurse: Thad Ranger RN, Denise Date/Time (Eastern Time): 04/19/2015 2:53:43 PM  Confirm and document reason for call. If symptomatic, describe symptoms. ---Caller states mother in law was short of breath so she gave her a breathing tx and a Lorazepam. Caller wants to make sure medications won't interact with the Fentanyl patch she is on. Caller denies any symptoms at this time and states she pt is sleeping now. States she gets very anxious if she gets SOB and the MD gave her Lorazepam to take q 8 hrs prn anxiety. States she gave her a Lorazepam and the breathing tx and the med works well. Wants to know if it is okay to give her.  Has the patient traveled out of the country within the last 30 days? ---Not Applicable  Does the patient require triage? ---No  Please document clinical information provided and list any resource used. ---Based on RN clinical knowledge, advised the pt can take the Lorazepam with use of Fentanyl patch, and it is best to keep her on a cont med regimen and give her the Lorazepam q 8 hrs a/o to prevent anxiety attacks. Verb understanding.    Guidelines     Guideline Title Affirmed Question Affirmed Notes        Final Disposition User   Clinical Call [place], RN, [employer]

## 2015-04-19 NOTE — Telephone Encounter (Signed)
Patient son called in stating that patient has been having diarrhea, SOB, weak, and cold. Call transferred to [facility]

## 2015-04-20 NOTE — Telephone Encounter (Signed)
FYI- Spoke with son-in-law who stated that yesterday the patient was going to the bathroom, she had an episode of loose stool.  According to his wife, the patient was having trouble getting her commode cleaned up from accident, and she got very worked up.  Son-in-law states she is very private and was embarrassed, so this caused her to panic.  Wife was home with patient, and by the time she got in the room, the patient was short of breath.  The wife was able to get the patient in the bed, gave her anxiety medication, and put her oxygen back on.  The patient was able to rest go to sleep for a while.  Son-in-law states that she has had no anxiety today and no trouble breathing since the incident.  No further episodes of diarrhea.  He states "as long as she stays calm, she is OK"    Advised him to call if patinent has ongoing diarrhea or anxiety, and to go to ED if she is short of breath and unable to resolve it by putting on oxygen.    Encouraged him to keep scheduled hospital follow-up 4/25.

## 2015-04-25 ENCOUNTER — Encounter: Payer: Self-pay | Admitting: Family Medicine

## 2015-04-25 ENCOUNTER — Ambulatory Visit (INDEPENDENT_AMBULATORY_CARE_PROVIDER_SITE_OTHER): Payer: Medicare Other | Admitting: Family Medicine

## 2015-04-25 VITALS — BP 90/50 | HR 85 | Temp 98.5°F | Wt 77.8 lb

## 2015-04-25 DIAGNOSIS — K219 Gastro-esophageal reflux disease without esophagitis: Secondary | ICD-10-CM

## 2015-04-25 DIAGNOSIS — I2510 Atherosclerotic heart disease of native coronary artery without angina pectoris: Secondary | ICD-10-CM | POA: Diagnosis not present

## 2015-04-25 DIAGNOSIS — J439 Emphysema, unspecified: Secondary | ICD-10-CM | POA: Insufficient documentation

## 2015-04-25 DIAGNOSIS — F411 Generalized anxiety disorder: Secondary | ICD-10-CM | POA: Diagnosis not present

## 2015-04-25 DIAGNOSIS — Z23 Encounter for immunization: Secondary | ICD-10-CM

## 2015-04-25 DIAGNOSIS — K449 Diaphragmatic hernia without obstruction or gangrene: Secondary | ICD-10-CM | POA: Diagnosis not present

## 2015-04-25 DIAGNOSIS — R0602 Shortness of breath: Secondary | ICD-10-CM

## 2015-04-25 DIAGNOSIS — J189 Pneumonia, unspecified organism: Secondary | ICD-10-CM

## 2015-04-25 DIAGNOSIS — R197 Diarrhea, unspecified: Secondary | ICD-10-CM

## 2015-04-25 MED ORDER — FOLTANX 3-35-2 MG PO TABS: ORAL_TABLET | ORAL | Status: DC

## 2015-04-25 MED ORDER — ZOSTER VACCINE LIVE 19400 UNT/0.65ML ~~LOC~~ SOLR: 0.6500 mL | Freq: Once | SUBCUTANEOUS | Status: DC

## 2015-04-25 MED ORDER — LORAZEPAM 0.5 MG PO TABS: ORAL_TABLET | ORAL | Status: DC

## 2015-04-25 NOTE — Progress Notes (Signed)
Pre visit review using our clinic review tool, if applicable. No additional management support is needed unless otherwise documented below in the visit note. 

## 2015-04-25 NOTE — Progress Notes (Signed)
Patient ID: Kerry Myers, female    DOB: February 02, 1919  Age: 81 y.o. MRN: [id number]    Subjective:  Subjective HPI Kerry Myers presents for f/u with her son and caretaker.  Pt is doing much better since being on lorazapam.     Review of Systems  Constitutional: Positive for fatigue. Negative for activity change, appetite change and unexpected weight change.  Respiratory: Negative for cough, choking, chest tightness, shortness of breath, wheezing and stridor.   Cardiovascular: Negative for chest pain and palpitations.  Gastrointestinal: Positive for diarrhea. Negative for abdominal pain and constipation.       Pt recently on abx  Psychiatric/Behavioral: Negative for behavioral problems and dysphoric mood. The patient is not nervous/anxious.     History Past Medical History  Diagnosis Date  . GERD (gastroesophageal reflux disease)   . Bladder incontinence   . Neuropathic pain   . RLS (restless legs syndrome)   . Arthritis   . LBBB (left bundle branch block)   . Pneumonia     She has past surgical history that includes Tonsillectomy; Abdominal hysterectomy; Incontinence surgery; Excision Morton's neuroma; Eye surgery; and Amputation (04/30/2012).   Her family history is not on file.She reports that she has never smoked. She has never used smokeless tobacco. She reports that she does not drink alcohol or use illicit drugs.  Current Outpatient Prescriptions on File Prior to Visit  Medication Sig Dispense Refill  . albuterol (PROVENTIL HFA;VENTOLIN HFA) 108 (90 BASE) MCG/ACT inhaler Inhale 2 puffs into the lungs every 6 (six) hours as needed for wheezing. 1 Inhaler 0  . Calcium Carb-Cholecalciferol (CALCIUM 600 + D PO) Take 1 tablet by mouth 2 (two) times daily.    . Cholecalciferol (VITAMIN D3) 5000 UNITS CAPS Take 1 capsule by mouth daily.    Kerry Myers Calcium (STOOL SOFTENER PO) Take 2 tablets by mouth 2 (two) times daily.    . feeding supplement, ENSURE ENLIVE, (ENSURE ENLIVE)  LIQD Take 237 mLs by mouth 2 (two) times daily between meals. 237 mL 12  . fentaNYL (DURAGESIC - DOSED MCG/HR) 12 MCG/HR Place 1 patch (12.5 mcg total) onto the skin every 3 (three) days. 10 patch 0  . gabapentin (NEURONTIN) 100 MG capsule Take 2 capsules by mouth  every morning , 2 capsules  by mouth at 2pm and 1  capsule at bedtime 450 capsule 3  . HYDROcodone-acetaminophen (NORCO) 5-325 MG per tablet Take 1 tablet by mouth every 8 (eight) hours as needed for moderate pain. 30 tablet 0  . magnesium hydroxide (MILK OF MAGNESIA) 400 MG/5ML suspension Take 30 mLs by mouth daily as needed for mild constipation.     . Multiple Vitamin (MULTIVITAMIN WITH MINERALS) TABS Take 1 tablet by mouth every morning.     . Omega-3 Fatty Acids (FISH OIL) 1000 MG CPDR Take 2,000 mg by mouth daily with lunch.     Marland Kitchen omeprazole (PRILOSEC) 40 MG capsule Take 1 capsule (40 mg total) by mouth at bedtime. 90 capsule 3  . oxybutynin (DITROPAN-XL) 10 MG 24 hr tablet Take 1 tablet by mouth  daily 90 tablet 1  . pramipexole (MIRAPEX) 0.5 MG tablet Take 1 tablet by mouth at  bedtime 90 tablet 3  . traMADol (ULTRAM) 50 MG tablet Take 1 tablet (50 mg total) by mouth 2 (two) times daily as needed. (Patient taking differently: Take 50 mg by mouth 2 (two) times daily as needed (pain). ) 180 tablet 2   No current facility-administered medications  on file prior to visit.     Objective:  Objective Physical Exam  Constitutional: She is oriented to person, place, and time. She appears well-developed and well-nourished.  HENT:  Head: Normocephalic and atraumatic.  Eyes: Conjunctivae and EOM are normal.  Neck: Normal range of motion. Neck supple. No JVD present. Carotid bruit is not present. No thyromegaly present.  Cardiovascular: Normal rate, regular rhythm and normal heart sounds.   No murmur heard. Pulmonary/Chest: Effort normal and breath sounds normal. No respiratory distress. She has no wheezes. She has no rales. She  exhibits no tenderness.  Musculoskeletal: She exhibits no edema.  Neurological: She is alert and oriented to person, place, and time.  Psychiatric: She has a normal mood and affect.   BP 90/50 mmHg  Pulse 85  Temp(Src) 98.5 F (36.9 C) (Oral)  Wt 77 lb 12.8 oz (35.29 kg)  SpO2 94% Wt Readings from Last 3 Encounters:  04/25/15 77 lb 12.8 oz (35.29 kg)  01/31/15 79 lb (35.834 kg)  01/17/15 79 lb 6 oz (36.004 kg)     Lab Results  Component Value Date   WBC 6.6 04/15/2015   HGB 11.5* 04/15/2015   HCT 36.4 04/15/2015   PLT 206 04/15/2015   GLUCOSE 113* 04/15/2015   CHOL 208* 01/31/2015   TRIG 97.0 01/31/2015   HDL 53.50 01/31/2015   LDLDIRECT 159.4 10/24/2006   LDLCALC 135* 01/31/2015   ALT 35 04/15/2015   AST 41* 04/15/2015   NA 139 04/15/2015   K 3.7 04/15/2015   CL 101 04/15/2015   CREATININE 0.59 04/15/2015   BUN 15 04/15/2015   CO2 29 04/15/2015   TSH 0.711 12/04/2013   HGBA1C 5.6 12/04/2013    Dg Chest 2 View  04/15/2015   CLINICAL DATA:  81 year old female with shortness of breath.  EXAM: CHEST  2 VIEW  COMPARISON:  04/10/2015  FINDINGS: Lung volumes remain low, emphysema again seen. Cardiomediastinal contours are unchanged. No pulmonary edema. Persistent but decreased atelectasis and blunting of left costophrenic angle. Mild increase in right basilar atelectasis. No pneumothorax. Scoliotic curvature and exaggerated thoracic kyphosis of the thoracic spine, bones are under mineralized.  IMPRESSION: 1. Improved left basilar aeration with persistent but diminished left basilar opacity and blunting of left costophrenic angle. 2. Mild increase in right basilar atelectasis 3. Emphysema again seen.   Electronically Signed   By: Jeb Levering M.D.   On: 04/15/2015 01:46     Assessment & Plan:  Plan I have discontinued [name]'s zoster vaccine live (PF), predniSONE, azithromycin, and cefdinir. I have also changed her LORazepam. Additionally, I am having her start on  zoster vaccine live (PF). Lastly, I am having her maintain her multivitamin with minerals, Vitamin D3, Docusate Calcium (STOOL SOFTENER PO), albuterol, Fish Oil, Calcium Carb-Cholecalciferol (CALCIUM 600 + D PO), magnesium hydroxide, omeprazole, pramipexole, HYDROcodone-acetaminophen, oxybutynin, traMADol, gabapentin, feeding supplement (ENSURE ENLIVE), fentaNYL, and FOLTANX.  Meds ordered this encounter  Medications  . DISCONTD: L-Methylfolate-B6-B12 (FOLTANX) 3-35-2 MG TABS    Sig: Take 1 tablet by mouth two  times daily    Dispense:  180 tablet    Refill:  1  . LORazepam (ATIVAN) 0.5 MG tablet    Sig: 1/2 tab po tid prn anxiety/ sleep    Dispense:  60 tablet    Refill:  1  . L-Methylfolate-B6-B12 (FOLTANX) 3-35-2 MG TABS    Sig: Take 1 tablet by mouth two  times daily    Dispense:  180 tablet  Refill:  1  . zoster vaccine live, PF, (ZOSTAVAX) 84536 UNT/0.65ML injection    Sig: Inject 19,400 Units into the skin once.    Dispense:  1 each    Refill:  0    Problem List Items Addressed This Visit    CAP (community acquired pneumonia)    Resolved See last cxr      Emphysema/COPD    Stable Uses albuterol prn      Hiatal hernia   Hiatal hernia with gastroesophageal reflux    prilosec Ho given to family      SOB (shortness of breath)    ? From anxiety Pt is much better since taking 1/2 ativan       Other Visit Diagnoses    Generalized anxiety disorder    -  Primary    Relevant Medications    LORazepam (ATIVAN) 0.5 MG tablet    Need for shingles vaccine        Relevant Medications    zoster vaccine live, PF, (ZOSTAVAX) 46803 UNT/0.65ML injection    Diarrhea        Relevant Orders    Clostridium difficile EIA    Stool culture       Follow-up: Return in about 6 months (around 10/25/2015), or if symptoms worsen or fail to improve, for f/u .  Garnet Koyanagi, DO

## 2015-04-25 NOTE — Assessment & Plan Note (Signed)
prilosec Ho given to family

## 2015-04-25 NOTE — Assessment & Plan Note (Signed)
?   From anxiety Pt is much better since taking 1/2 ativan

## 2015-04-25 NOTE — Assessment & Plan Note (Signed)
Resolved See last cxr

## 2015-04-25 NOTE — Patient Instructions (Addendum)
Generalized Anxiety Disorder Generalized anxiety disorder (GAD) is a mental disorder. It interferes with life functions, including relationships, work, and school. GAD is different from normal anxiety, which everyone experiences at some point in their lives in response to specific life events and activities. Normal anxiety actually helps [place] prepare for and get through these life events and activities. Normal anxiety goes away after the event or activity is over.  GAD causes anxiety that is not necessarily related to specific events or activities. It also causes excess anxiety in proportion to specific events or activities. The anxiety associated with GAD is also difficult to control. GAD can vary from mild to severe. People with severe GAD can have intense waves of anxiety with physical symptoms (panic attacks).  SYMPTOMS The anxiety and worry associated with GAD are difficult to control. This anxiety and worry are related to many life events and activities and also occur more days than not for 6 months or longer. People with GAD also have three or more of the following symptoms (one or more in children):  Restlessness.   Fatigue.  Difficulty concentrating.   Irritability.  Muscle tension.  Difficulty sleeping or unsatisfying sleep. DIAGNOSIS GAD is diagnosed through an assessment by your health care provider. Your health care provider will ask you questions aboutyour mood,physical symptoms, and events in your life. Your health care provider may ask you about your medical history and use of alcohol or drugs, including prescription medicines. Your health care provider may also do a physical exam and blood tests. Certain medical conditions and the use of certain substances can cause symptoms similar to those associated with GAD. Your health care provider may refer you to a mental health specialist for further evaluation. TREATMENT The following therapies are usually used to treat GAD:    Medication. Antidepressant medication usually is prescribed for long-term daily control. Antianxiety medicines may be added in severe cases, especially when panic attacks occur.   Talk therapy (psychotherapy). Certain types of talk therapy can be helpful in treating GAD by providing support, education, and guidance. A form of talk therapy called cognitive behavioral therapy can teach you healthy ways to think about and react to daily life events and activities.  Stress managementtechniques. These include yoga, meditation, and exercise and can be very helpful when they are practiced regularly. A mental health specialist can help determine which treatment is best for you. Some people see improvement with one therapy. However, other people require a combination of therapies. Document Released: 04/13/2013 Document Revised: 05/03/2014 Document Reviewed: 04/13/2013 [facility] Patient Information 2015 [place], [place]. This information is not intended to replace advice given to you by your health care provider. Make sure you discuss any questions you have with your health care provider.   Hiatal Hernia A hiatal hernia occurs when part of your stomach slides above the muscle that separates your abdomen from your chest (diaphragm). You can be born with a hiatal hernia (congenital), or it may develop over time. In almost all cases of hiatal hernia, only the top part of the stomach pushes through.  Many people have a hiatal hernia with no symptoms. The larger the hernia, the more likely that you will have symptoms. In some cases, a hiatal hernia allows stomach acid to flow back into the tube that carries food from your mouth to your stomach (esophagus). This may cause heartburn symptoms. Severe heartburn symptoms may mean you have developed a condition called gastroesophageal reflux disease (GERD).  CAUSES  Hiatal hernias are caused  by a weakness in the opening (hiatus) where your esophagus passes through your  diaphragm to attach to the upper part of your stomach. You may be born with a weakness in your hiatus, or a weakness can develop. RISK FACTORS Older age is a major risk factor for a hiatal hernia. Anything that increases pressure on your diaphragm can also increase your risk of a hiatal hernia. This includes:  Pregnancy.  Excess weight.  Frequent constipation. SIGNS AND SYMPTOMS  People with a hiatal hernia often have no symptoms. If symptoms develop, they are almost always caused by GERD. They may include:  Heartburn.  Belching.  Indigestion.  Trouble swallowing.  Coughing or wheezing.  Sore throat.  Hoarseness.  Chest pain. DIAGNOSIS  A hiatal hernia is sometimes found during an exam for another problem. Your health care provider may suspect a hiatal hernia if you have symptoms of GERD. Tests may be done to diagnose GERD. These may include:  X-rays of your stomach or chest.  An upper gastrointestinal (GI) series. This is an X-ray exam of your GI tract involving the use of a chalky liquid that you swallow. The liquid shows up clearly on the X-ray.  Endoscopy. This is a procedure to look into your stomach using a thin, flexible tube that has a tiny camera and light on the end of it. TREATMENT  If you have no symptoms, you may not need treatment. If you have symptoms, treatment may include:  Dietary and lifestyle changes to help reduce GERD symptoms.  Medicines. These may include:  Over-the-counter antacids.  Medicines that make your stomach empty more quickly.  Medicines that block the production of stomach acid (H2 blockers).  Stronger medicines to reduce stomach acid (proton pump inhibitors).  You may need surgery to repair the hernia if other treatments are not helping. HOME CARE INSTRUCTIONS   Take all medicines as directed by your health care provider.  Quit smoking, if you smoke.  Try to achieve and maintain a healthy body weight.  Eat frequent small  meals instead of three large meals a day. This keeps your stomach from getting too full.  Eat slowly.  Do not lie down right after eating.  Do noteat 1-2 hours before bed.   Do not drink beverages with caffeine. These include cola, coffee, cocoa, and tea.  Do not drink alcohol.  Avoid foods that can make symptoms of GERD worse. These may include:  Fatty foods.  Citrus fruits.  Other foods and drinks that contain acid.  Avoid putting pressure on your belly. Anything that puts pressure on your belly increases the amount of acid that may be pushed up into your esophagus.   Avoid bending over, especially after eating.  Raise the head of your bed by putting blocks under the legs. This keeps your head and esophagus higher than your stomach.  Do not wear tight clothing around your chest or stomach.  Try not to strain when having a bowel movement, when urinating, or when lifting heavy objects. SEEK MEDICAL CARE IF:  Your symptoms are not controlled with medicines or lifestyle changes.  You are having trouble swallowing.  You have coughing or wheezing that will not go away. SEEK IMMEDIATE MEDICAL CARE IF:  Your pain is getting worse.  Your pain spreads to your arms, neck, jaw, teeth, or back.  You have shortness of breath.  You sweat for no reason.  You feel sick to your stomach (nauseous) or vomit.  You vomit blood.  You  have bright red blood in your stools.  You have black, tarry stools.  Document Released: 03/08/2004 Document Revised: 05/03/2014 Document Reviewed: 12/04/2013 [facility] Patient Information 2015 [place], [place]. This information is not intended to replace advice given to you by your health care provider. Make sure you discuss any questions you have with your health care provider.

## 2015-04-25 NOTE — Assessment & Plan Note (Signed)
Stable Uses albuterol prn

## 2015-04-27 ENCOUNTER — Other Ambulatory Visit: Payer: Self-pay | Admitting: Emergency Medicine

## 2015-04-27 MED ORDER — IPRATROPIUM-ALBUTEROL 0.5-2.5 (3) MG/3ML IN SOLN: 3.0000 mL | Freq: Four times a day (QID) | RESPIRATORY_TRACT | Status: AC | PRN

## 2015-05-06 ENCOUNTER — Telehealth: Payer: Self-pay

## 2015-05-06 ENCOUNTER — Telehealth: Payer: Self-pay | Admitting: Family Medicine

## 2015-05-06 NOTE — Telephone Encounter (Signed)
Call from [facility] and he stated that the patient is having difficulty breathing, she was supposed to have received a special mask from [facility] a few days ago but they have not received anything. He said when he called [facility] he was told that he would need an order. He said his mother was having a breathing attack and her 02 is 89 on oxygen, as we were speaking he said the concentrator has stopped. I made him aware to call 911 and allow EMS to evaluate her possibly take her to the ED. He verbalized understanding and has agreed to do so.      KP

## 2015-05-06 NOTE — Telephone Encounter (Signed)
[facility] - Client [facility]  Patient Name: Kerry Myers  DOB: April 01, 1919    Initial Comment Caller states his mother is having shortness of breath and is on oxygen,    Nurse Assessment  Nurse: Wynetta Emery, RN, Baker Janus Date/Time (Eastern Time): 05/06/2015 3:24:02 PM  Confirm and document reason for call. If symptomatic, describe symptoms. ---[name] is short of breath over two weeks and she is on oxygen therapy. 2L per nasal cannula when he puts a face mask on her (he made it) 4 to 5 L she still short of breath.  Has the patient traveled out of the country within the last 30 days? ---No  Does the patient require triage? ---Yes  Related visit to physician within the last 2 weeks? ---No  Does the PT have any chronic conditions? (i.e. diabetes, asthma, etc.) ---Yes  List chronic conditions. ---unknown     Guidelines    Guideline Title Affirmed Question Affirmed Notes  Breathing Difficulty [1] MILD difficulty breathing (e.g., minimal/no SOB at rest, SOB with walking, pulse <100) AND [2] NEW-onset or WORSE than normal    Final Disposition User   See Physician within 4 Hours (or PCP triage) Wynetta Emery, RN, Baker Janus    Comments  She gets up to go to the Bathroom she wants to be up but causes her to hurt they put her back to bed right side where ribs pull in d/t scleosis -- becomes anxious when family is not in home away on errands and he made a mask from his cpap mask and she is breathing from nose and the shortness of breath gets better.

## 2015-05-09 ENCOUNTER — Telehealth: Payer: Self-pay | Admitting: Family Medicine

## 2015-05-09 MED ORDER — ADULT MASK MISC: Status: DC

## 2015-05-09 MED ORDER — ADULT MASK MISC
Status: AC
Start: 2015-05-09 — End: ?

## 2015-05-09 NOTE — Telephone Encounter (Signed)
Ok to give order but she should have gone to ER

## 2015-05-09 NOTE — Telephone Encounter (Signed)
Order faxed to [facility]. She was evaluated by EMS and the patient did not need to go to the ED per son.     KP

## 2015-05-09 NOTE — Telephone Encounter (Signed)
CALLER: Wolfgang Phoenix REL TO PT: SPOUSE PH#: [phone]  REASON FOR CALL: [name] call 911 05/06/15. EMS came to the pt home but did not transport her to hospital. They were able to get pt sats stabilized and get her to calm down. Pt has anxiety as well. [name] states that [place] states needs  RX from [place] for special type of o2 mask. I verified pt using 2lpm. [name] states that he takes it upon himself to turn up the oxygen level if she is having trouble breathing. Pt concentrator has been going in and out. It is currently working, but [name] states that it stopped working completely for several minutes Friday evening. Purnell Shoemaker to call [facility] for exchange on oxygen concentrator. Advised that we will contact [facility] to find out what they need rx written for (special o2 mask) and if they need written order to exchange concentrator.

## 2015-05-09 NOTE — Telephone Encounter (Signed)
I spoke with Tammy at [facility] and she stated in order for the patient to get a mask for her concentrator, they would need an order. She said there is a active work order to [employer] educational needs teacher from a call that they received today from [place]. She stated that they have a CPAP mask duct taped to the concentrator and this is inappropriate for the patient. She is concerned that with Mr.Blaklety adjusting her oxygen settings that he is going to over oxygenate the patient. She never went to the ED in Friday.  Please advise on order for the mask.       KP

## 2015-05-09 NOTE — Telephone Encounter (Signed)
noted 

## 2015-05-20 ENCOUNTER — Telehealth: Payer: Self-pay | Admitting: Family Medicine

## 2015-05-20 DIAGNOSIS — G894 Chronic pain syndrome: Secondary | ICD-10-CM

## 2015-05-20 MED ORDER — FENTANYL 12 MCG/HR TD PT72: 12.5000 ug | MEDICATED_PATCH | TRANSDERMAL | Status: DC

## 2015-05-20 NOTE — Telephone Encounter (Signed)
Last seen 04/25/15 and filled 04/15/15 #10 UDS 01/31/15   Please advise     KP

## 2015-05-20 NOTE — Telephone Encounter (Signed)
Refill x1 

## 2015-05-20 NOTE — Telephone Encounter (Signed)
Caller name: timothy Relation to pt: son Call back number: [phone] Pharmacy:  Reason for call:   Requesting fentynol patch for patient

## 2015-05-20 NOTE — Telephone Encounter (Signed)
Patient aware Rx ready for pick.    KP

## 2015-05-23 MED ORDER — FENTANYL 12 MCG/HR TD PT72: 12.5000 ug | MEDICATED_PATCH | TRANSDERMAL | Status: DC

## 2015-05-23 NOTE — Addendum Note (Signed)
Addended by: Ewing Schlein on: 05/23/2015 08:38 AM   Modules accepted: Orders

## 2015-05-23 NOTE — Addendum Note (Signed)
Addended by: Ewing Schlein on: 05/23/2015 08:41 AM   Modules accepted: Orders

## 2015-06-01 ENCOUNTER — Ambulatory Visit (INDEPENDENT_AMBULATORY_CARE_PROVIDER_SITE_OTHER): Payer: Medicare Other | Admitting: Physician Assistant

## 2015-06-01 ENCOUNTER — Encounter: Payer: Self-pay | Admitting: Physician Assistant

## 2015-06-01 VITALS — BP 130/70 | HR 66 | Temp 98.3°F | Wt 78.2 lb

## 2015-06-01 DIAGNOSIS — I2510 Atherosclerotic heart disease of native coronary artery without angina pectoris: Secondary | ICD-10-CM | POA: Diagnosis not present

## 2015-06-01 DIAGNOSIS — L89109 Pressure ulcer of unspecified part of back, unspecified stage: Secondary | ICD-10-CM | POA: Insufficient documentation

## 2015-06-01 DIAGNOSIS — L89101 Pressure ulcer of unspecified part of back, stage 1: Secondary | ICD-10-CM | POA: Diagnosis not present

## 2015-06-01 NOTE — Assessment & Plan Note (Signed)
No evidence of infection. Patient with severe scoliosis which has contributed to sore in atypical location.  Wound care performed in office -- protective dressing applied.  Discussed home care with care giver -- use of pillows to keep her off of back and slightly on side since she cannot turn herself.  Turn Q2-3H. Order placed for skilled nursing and wound care in the home.  Follow-up with [facility] as scheduled.

## 2015-06-01 NOTE — Patient Instructions (Signed)
Use the Tefla given to change her dressing once daily.  Keep area clean and dry.  Keep her turned every 2-3 hours using the pillow technique we discussed.  I will be sending out an RN for wound care and assessment. Follow-up with Wound Center as scheduled.

## 2015-06-01 NOTE — Progress Notes (Signed)
Pre visit review using our clinic review tool, if applicable. No additional management support is needed unless otherwise documented below in the visit note. 

## 2015-06-01 NOTE — Progress Notes (Signed)
Patient presents to clinic today with caregiver who endorses patient has had worsening of a sore on her left thoracic back.  Patient has upcoming appointment with wound care center set up by her PCP but unforutantely is not until the end of June.  Caregiver endorses redness and tenderness with some central whiteness but denies ulceration, drainage, fever or chills. Does not have home wound care set up.  Past Medical History  Diagnosis Date  . GERD (gastroesophageal reflux disease)   . Bladder incontinence   . Neuropathic pain   . RLS (restless legs syndrome)   . Arthritis   . LBBB (left bundle branch block)   . Pneumonia     Current Outpatient Prescriptions on File Prior to Visit  Medication Sig Dispense Refill  . albuterol (PROVENTIL HFA;VENTOLIN HFA) 108 (90 BASE) MCG/ACT inhaler Inhale 2 puffs into the lungs every 6 (six) hours as needed for wheezing. 1 Inhaler 0  . Calcium Carb-Cholecalciferol (CALCIUM 600 + D PO) Take 1 tablet by mouth 2 (two) times daily.    . Cholecalciferol (VITAMIN D3) 5000 UNITS CAPS Take 1 capsule by mouth daily.    [name] Calcium (STOOL SOFTENER PO) Take 2 tablets by mouth 2 (two) times daily.    . feeding supplement, ENSURE ENLIVE, (ENSURE ENLIVE) LIQD Take 237 mLs by mouth 2 (two) times daily between meals. 237 mL 12  . fentaNYL (DURAGESIC - DOSED MCG/HR) 12 MCG/HR Place 1 patch (12.5 mcg total) onto the skin every 3 (three) days. 10 patch 0  . gabapentin (NEURONTIN) 100 MG capsule Take 2 capsules by mouth  every morning , 2 capsules  by mouth at 2pm and 1  capsule at bedtime 450 capsule 3  . HYDROcodone-acetaminophen (NORCO) 5-325 MG per tablet Take 1 tablet by mouth every 8 (eight) hours as needed for moderate pain. 30 tablet 0  . ipratropium-albuterol (DUONEB) 0.5-2.5 (3) MG/3ML SOLN Take 3 mLs by nebulization every 6 (six) hours as needed. 360 mL 0  . L-Methylfolate-B6-B12 (FOLTANX) 3-35-2 MG TABS Take 1 tablet by mouth two  times daily 180 tablet  1  . LORazepam (ATIVAN) 0.5 MG tablet 1/2 tab po tid prn anxiety/ sleep 60 tablet 1  . magnesium hydroxide (MILK OF MAGNESIA) 400 MG/5ML suspension Take 30 mLs by mouth daily as needed for mild constipation.     . Multiple Vitamin (MULTIVITAMIN WITH MINERALS) TABS Take 1 tablet by mouth every morning.     . Omega-3 Fatty Acids (FISH OIL) 1000 MG CPDR Take 2,000 mg by mouth daily with lunch.     Marland Kitchen omeprazole (PRILOSEC) 40 MG capsule Take 1 capsule (40 mg total) by mouth at bedtime. 90 capsule 3  . oxybutynin (DITROPAN-XL) 10 MG 24 hr tablet Take 1 tablet by mouth  daily 90 tablet 1  . pramipexole (MIRAPEX) 0.5 MG tablet Take 1 tablet by mouth at  bedtime 90 tablet 3  . Respiratory Therapy Supplies (ADULT MASK) MISC Oxygen Mask for home Oxygen 1 each 2  . traMADol (ULTRAM) 50 MG tablet Take 1 tablet (50 mg total) by mouth 2 (two) times daily as needed. (Patient taking differently: Take 50 mg by mouth 2 (two) times daily as needed (pain). ) 180 tablet 2  . zoster vaccine live, PF, (ZOSTAVAX) 30092 UNT/0.65ML injection Inject 19,400 Units into the skin once. 1 each 0   No current facility-administered medications on file prior to visit.    No Known Allergies  No family history on file.  History   Social History  . Marital Status: Widowed    Spouse Name: N/A  . Number of Children: N/A  . Years of Education: N/A   Social History Main Topics  . Smoking status: Never Smoker   . Smokeless tobacco: Never Used  . Alcohol Use: No  . Drug Use: No  . Sexual Activity: Not on file   Other Topics Concern  . None   Social History Narrative   Review of Systems - See HPI.  All other ROS are negative.  BP 130/70 mmHg  Pulse 66  Temp(Src) 98.3 F (36.8 C) (Oral)  Wt 78 lb 3.2 oz (35.471 kg)  SpO2 94%  Physical Exam  Constitutional: She is oriented to person, place, and time and well-developed, well-nourished, and in no distress.  HENT:  Head: Normocephalic and atraumatic.  Nose: Nose  normal.  Cardiovascular: Normal rate, normal heart sounds and intact distal pulses.   Pulmonary/Chest: Effort normal and breath sounds normal.  Neurological: She is alert and oriented to person, place, and time.  Skin: Skin is warm and dry.       Recent Results (from the past 2160 hour(s))  CBC with Differential     Status: Abnormal   Collection Time: 04/10/15  1:53 PM  Result Value Ref Range   WBC 6.3 4.0 - 10.5 K/uL   RBC 3.88 3.87 - 5.11 MIL/uL   Hemoglobin 11.7 (L) 12.0 - 15.0 g/dL   HCT 37.0 36.0 - 46.0 %   MCV 95.4 78.0 - 100.0 fL   MCH 30.2 26.0 - 34.0 pg   MCHC 31.6 30.0 - 36.0 g/dL   RDW 14.3 11.5 - 15.5 %   Platelets 204 150 - 400 K/uL   Neutrophils Relative % 78 (H) 43 - 77 %   Neutro Abs 4.9 1.7 - 7.7 K/uL   Lymphocytes Relative 15 12 - 46 %   Lymphs Abs 1.0 0.7 - 4.0 K/uL   Monocytes Relative 6 3 - 12 %   Monocytes Absolute 0.4 0.1 - 1.0 K/uL   Eosinophils Relative 0 0 - 5 %   Eosinophils Absolute 0.0 0.0 - 0.7 K/uL   Basophils Relative 1 0 - 1 %   Basophils Absolute 0.0 0.0 - 0.1 K/uL  Basic metabolic panel     Status: Abnormal   Collection Time: 04/10/15  1:53 PM  Result Value Ref Range   Sodium 139 135 - 145 mmol/L   Potassium 3.7 3.5 - 5.1 mmol/L   Chloride 100 96 - 112 mmol/L   CO2 30 19 - 32 mmol/L   Glucose, Bld 102 (H) 70 - 99 mg/dL   BUN 13 6 - 23 mg/dL   Creatinine, Ser 0.53 0.50 - 1.10 mg/dL   Calcium 9.4 8.4 - 10.5 mg/dL   GFR calc non Af Amer 79 (L) >90 mL/min   GFR calc Af Amer >90 >90 mL/min    Comment: (NOTE) The eGFR has been calculated using the CKD EPI equation. This calculation has not been validated in all clinical situations. eGFR's persistently <90 mL/min signify possible Chronic Kidney Disease.    Anion gap 9 5 - 15  Urinalysis, Routine w reflex microscopic     Status: None   Collection Time: 04/10/15  3:40 PM  Result Value Ref Range   Color, Urine YELLOW YELLOW   APPearance CLEAR CLEAR   Specific Gravity, Urine 1.010 1.005  - 1.030   pH 8.0 5.0 - 8.0   Glucose, UA NEGATIVE NEGATIVE  mg/dL   Hgb urine dipstick NEGATIVE NEGATIVE   Bilirubin Urine NEGATIVE NEGATIVE   Ketones, ur NEGATIVE NEGATIVE mg/dL   Protein, ur NEGATIVE NEGATIVE mg/dL   Urobilinogen, UA 0.2 0.0 - 1.0 mg/dL   Nitrite NEGATIVE NEGATIVE   Leukocytes, UA NEGATIVE NEGATIVE    Comment: MICROSCOPIC NOT DONE ON URINES WITH NEGATIVE PROTEIN, BLOOD, LEUKOCYTES, NITRITE, OR GLUCOSE <1000 mg/dL.  Legionella antigen, urine     Status: None   Collection Time: 04/10/15 11:44 PM  Result Value Ref Range   Specimen Description URINE, RANDOM    Special Requests NONE    Legionella Antigen, Urine      Negative for Legionella pneumophila serogroup 1                                                              Legionella pneumophila serogroup 1 antigen can be detected in urine within 2 to 3 days of infection and may persist even after treatment. This  assay does not detect other Legionella species or serogroups. Performed at [facility]    Report Status 04/12/2015 FINAL   Strep pneumoniae urinary antigen     Status: None   Collection Time: 04/10/15 11:44 PM  Result Value Ref Range   Strep Pneumo Urinary Antigen NEGATIVE NEGATIVE    Comment:        Infection due to S. pneumoniae cannot be absolutely ruled out since the antigen present may be below the detection limit of the test.   Influenza panel by PCR (type A & B, H1N1)     Status: None   Collection Time: 04/11/15  6:13 AM  Result Value Ref Range   Influenza A By PCR NEGATIVE NEGATIVE   Influenza B By PCR NEGATIVE NEGATIVE   H1N1 flu by pcr NOT DETECTED NOT DETECTED    Comment:        The Xpert Flu assay (FDA approved for nasal aspirates or washes and nasopharyngeal swab specimens), is intended as an aid in the diagnosis of influenza and should not be used as a sole basis for treatment.   CBC     Status: Abnormal   Collection Time: 04/11/15  6:40 AM  Result Value Ref Range   WBC  4.8 4.0 - 10.5 K/uL   RBC 3.51 (L) 3.87 - 5.11 MIL/uL   Hemoglobin 10.6 (L) 12.0 - 15.0 g/dL   HCT 13.6 (L) 53.6 - 64.0 %   MCV 92.9 78.0 - 100.0 fL   MCH 30.2 26.0 - 34.0 pg   MCHC 32.5 30.0 - 36.0 g/dL   RDW 17.7 65.2 - 85.9 %   Platelets 203 150 - 400 K/uL  Comprehensive metabolic panel     Status: Abnormal   Collection Time: 04/15/15 12:19 AM  Result Value Ref Range   Sodium 139 135 - 145 mmol/L   Potassium 3.7 3.5 - 5.1 mmol/L   Chloride 101 96 - 112 mmol/L   CO2 29 19 - 32 mmol/L   Glucose, Bld 113 (H) 70 - 99 mg/dL   BUN 15 6 - 23 mg/dL   Creatinine, Ser 1.86 0.50 - 1.10 mg/dL   Calcium 9.0 8.4 - 02.2 mg/dL   Total Protein 6.4 6.0 - 8.3 g/dL   Albumin 3.4 (L) 3.5 - 5.2 g/dL  AST 41 (H) 0 - 37 U/L   ALT 35 0 - 35 U/L   Alkaline Phosphatase 67 39 - 117 U/L   Total Bilirubin 0.5 0.3 - 1.2 mg/dL   GFR calc non Af Amer 77 (L) >90 mL/min   GFR calc Af Amer 89 (L) >90 mL/min    Comment: (NOTE) The eGFR has been calculated using the CKD EPI equation. This calculation has not been validated in all clinical situations. eGFR's persistently <90 mL/min signify possible Chronic Kidney Disease.    Anion gap 9 5 - 15  Troponin I     Status: None   Collection Time: 04/15/15 12:19 AM  Result Value Ref Range   Troponin I <0.03 <0.031 ng/mL    Comment:        NO INDICATION OF MYOCARDIAL INJURY.   CBC with Differential     Status: Abnormal   Collection Time: 04/15/15 12:19 AM  Result Value Ref Range   WBC 6.6 4.0 - 10.5 K/uL   RBC 3.90 3.87 - 5.11 MIL/uL   Hemoglobin 11.5 (L) 12.0 - 15.0 g/dL   HCT 36.4 36.0 - 46.0 %   MCV 93.3 78.0 - 100.0 fL   MCH 29.5 26.0 - 34.0 pg   MCHC 31.6 30.0 - 36.0 g/dL   RDW 14.2 11.5 - 15.5 %   Platelets 206 150 - 400 K/uL   Neutrophils Relative % 70 43 - 77 %   Neutro Abs 4.6 1.7 - 7.7 K/uL   Lymphocytes Relative 21 12 - 46 %   Lymphs Abs 1.4 0.7 - 4.0 K/uL   Monocytes Relative 9 3 - 12 %   Monocytes Absolute 0.6 0.1 - 1.0 K/uL    Eosinophils Relative 0 0 - 5 %   Eosinophils Absolute 0.0 0.0 - 0.7 K/uL   Basophils Relative 0 0 - 1 %   Basophils Absolute 0.0 0.0 - 0.1 K/uL  Brain natriuretic peptide     Status: Abnormal   Collection Time: 04/15/15 12:20 AM  Result Value Ref Range   B Natriuretic Peptide 195.4 (H) 0.0 - 100.0 pg/mL  Urinalysis, Routine w reflex microscopic     Status: None   Collection Time: 04/15/15 12:28 AM  Result Value Ref Range   Color, Urine YELLOW YELLOW   APPearance CLEAR CLEAR   Specific Gravity, Urine 1.010 1.005 - 1.030   pH 8.0 5.0 - 8.0   Glucose, UA NEGATIVE NEGATIVE mg/dL   Hgb urine dipstick NEGATIVE NEGATIVE   Bilirubin Urine NEGATIVE NEGATIVE   Ketones, ur NEGATIVE NEGATIVE mg/dL   Protein, ur NEGATIVE NEGATIVE mg/dL   Urobilinogen, UA 0.2 0.0 - 1.0 mg/dL   Nitrite NEGATIVE NEGATIVE   Leukocytes, UA NEGATIVE NEGATIVE    Comment: MICROSCOPIC NOT DONE ON URINES WITH NEGATIVE PROTEIN, BLOOD, LEUKOCYTES, NITRITE, OR GLUCOSE <1000 mg/dL.    Assessment/Plan: Pressure ulcer of back No evidence of infection. Patient with severe scoliosis which has contributed to sore in atypical location.  Wound care performed in office -- protective dressing applied.  Discussed home care with care giver -- use of pillows to keep her off of back and slightly on side since she cannot turn herself.  Turn Q2-3H. Order placed for skilled nursing and wound care in the home.  Follow-up with Wound Clinic as scheduled.

## 2015-06-14 DIAGNOSIS — M1990 Unspecified osteoarthritis, unspecified site: Secondary | ICD-10-CM

## 2015-06-14 DIAGNOSIS — Z9981 Dependence on supplemental oxygen: Secondary | ICD-10-CM | POA: Diagnosis not present

## 2015-06-14 DIAGNOSIS — L89102 Pressure ulcer of unspecified part of back, stage 2: Secondary | ICD-10-CM | POA: Diagnosis not present

## 2015-06-15 ENCOUNTER — Encounter: Payer: Self-pay | Admitting: Internal Medicine

## 2015-06-15 ENCOUNTER — Ambulatory Visit (INDEPENDENT_AMBULATORY_CARE_PROVIDER_SITE_OTHER): Payer: Medicare Other | Admitting: Internal Medicine

## 2015-06-15 VITALS — BP 116/64 | HR 65 | Temp 98.1°F | Wt 77.2 lb

## 2015-06-15 DIAGNOSIS — J189 Pneumonia, unspecified organism: Secondary | ICD-10-CM

## 2015-06-15 DIAGNOSIS — R410 Disorientation, unspecified: Secondary | ICD-10-CM

## 2015-06-15 DIAGNOSIS — R399 Unspecified symptoms and signs involving the genitourinary system: Secondary | ICD-10-CM

## 2015-06-15 DIAGNOSIS — I2510 Atherosclerotic heart disease of native coronary artery without angina pectoris: Secondary | ICD-10-CM

## 2015-06-15 DIAGNOSIS — G894 Chronic pain syndrome: Secondary | ICD-10-CM

## 2015-06-15 DIAGNOSIS — F05 Delirium due to known physiological condition: Secondary | ICD-10-CM

## 2015-06-15 MED ORDER — FENTANYL 12 MCG/HR TD PT72: 12.5000 ug | MEDICATED_PATCH | TRANSDERMAL | Status: DC

## 2015-06-15 NOTE — Patient Instructions (Signed)
Go to the lab on provide a urine sample  Get a chest x-ray downstairs  Stop taking calcium supplements, okay to take vitamin D supplements  MiraLAX, take 10 mg every day with meals  Call anytime if you have worse, have fever, chills, more confusion  Please see your primary doctor in 7-10 days.

## 2015-06-15 NOTE — Progress Notes (Signed)
Subjective:    Patient ID: Kerry Myers, female    DOB: 04/23/1919, 81 y.o.   MRN: [id number]  DOS:  06/15/2015  Type of visit - description : Acute, here one of her caregivers Interval history: The patient confusion is worse than baseline for the last few days and  in general, seems  that she is not feeling well. Family is concerned about possibly UTI although the patient has not expressed any specific concerns such as dysuria. Medication list reviewed, she's on pain medication. Recent labs normal Pneumonia diagnosed in April, follow-up chest x-ray showed improvement of the infiltrate.  Bump her left pretibial area on a chair, has a wound   Review of Systems No actual fever or chills Appetite is slightly decreased for a few weeks but no weight loss. No nausea, vomiting, diarrhea. Bowel movements: Has chronic constipation, goes to the bathroom twice a week at most. Sometimes reports her "stomach  gets hard" but is difficult to get more information.  I ask ifr she was constipated, she said no. (History of dementia)   Past Medical History  Diagnosis Date  . GERD (gastroesophageal reflux disease)   . Bladder incontinence   . Neuropathic pain   . RLS (restless legs syndrome)   . Arthritis   . LBBB (left bundle branch block)   . Pneumonia     Past Surgical History  Procedure Laterality Date  . Tonsillectomy    . Abdominal hysterectomy      TVH  . Incontinence surgery    . Excision morton's neuroma    . Eye surgery      cataracts  . Amputation  04/30/2012    Procedure: AMPUTATION DIGIT;  Surgeon: Colin Rhein, MD;  Location: [facility];  Service: Orthopedics;  Laterality: Right;  right 2nd toe amptation through MTP joint    History   Social History  . Marital Status: Widowed    Spouse Name: N/A  . Number of Children: N/A  . Years of Education: N/A   Occupational History  . Not on file.   Social History Main Topics  . Smoking status: Never Smoker     . Smokeless tobacco: Never Used  . Alcohol Use: No  . Drug Use: No  . Sexual Activity: Not on file   Other Topics Concern  . Not on file   Social History Narrative   Lives at home, 2 sons live w/ her    Has care givers 24/7        Medication List       This list is accurate as of: 06/15/15 11:59 PM.  Always use your most recent med list.               Adult Mask Misc  Oxygen Mask for home Oxygen     albuterol 108 (90 BASE) MCG/ACT inhaler  Commonly known as:  PROVENTIL HFA;VENTOLIN HFA  Inhale 2 puffs into the lungs every 6 (six) hours as needed for wheezing.     feeding supplement (ENSURE ENLIVE) Liqd  Take 237 mLs by mouth 2 (two) times daily between meals.     fentaNYL 12 MCG/HR  Commonly known as:  DURAGESIC - dosed mcg/hr  Place 1 patch (12.5 mcg total) onto the skin every 3 (three) days.     Fish Oil 1000 MG Cpdr  Take 2,000 mg by mouth daily with lunch.     FOLTANX 3-35-2 MG Tabs  Take 1 tablet by mouth two  times daily     gabapentin 100 MG capsule  Commonly known as:  NEURONTIN  Take 2 capsules by mouth  every morning , 2 capsules  by mouth at 2pm and 1  capsule at bedtime     HYDROcodone-acetaminophen 5-325 MG per tablet  Commonly known as:  NORCO  Take 1 tablet by mouth every 8 (eight) hours as needed for moderate pain.     ipratropium-albuterol 0.5-2.5 (3) MG/3ML Soln  Commonly known as:  DUONEB  Take 3 mLs by nebulization every 6 (six) hours as needed.     LORazepam 0.5 MG tablet  Commonly known as:  ATIVAN  1/2 tab po tid prn anxiety/ sleep     MILK OF MAGNESIA 400 MG/5ML suspension  Generic drug:  magnesium hydroxide  Take 30 mLs by mouth daily as needed for mild constipation.     multivitamin with minerals Tabs tablet  Take 1 tablet by mouth every morning.     omeprazole 40 MG capsule  Commonly known as:  PRILOSEC  Take 1 capsule (40 mg total) by mouth at bedtime.     oxybutynin 10 MG 24 hr tablet  Commonly known as:   DITROPAN-XL  Take 1 tablet by mouth  daily     pramipexole 0.5 MG tablet  Commonly known as:  MIRAPEX  Take 1 tablet by mouth at  bedtime     STOOL SOFTENER PO  Take 2 tablets by mouth 2 (two) times daily.     traMADol 50 MG tablet  Commonly known as:  ULTRAM  Take 1 tablet (50 mg total) by mouth 2 (two) times daily as needed.     Vitamin D3 5000 UNITS Caps  Take 1 capsule by mouth daily.     zoster vaccine live (PF) 19400 UNT/0.65ML injection  Commonly known as:  ZOSTAVAX  Inject 19,400 Units into the skin once.           Objective:   Physical Exam BP 116/64 mmHg  Pulse 65  Temp(Src) 98.1 F (36.7 C) (Oral)  Ht   Wt 77 lb 4 oz (35.04 kg)  SpO2 98% General:   pleasently  demented undernourished elderly lady in a wheelchair, no distress  HEENT:  Normocephalic . Face symmetric, atraumatic Lungs:  There is good sounds bilaterally Normal respiratory effort, no intercostal retractions, no accessory muscle use. Heart: Distant heart sounds   Abdomen:  Not distended, soft, non-tender. No rebound or rigidity.  Skin: Not pale. Not jaundice. Has an excoriation on the left pretibial area, no actual bleeding, no red or warm Neurologic:  alert , trying to be cooperative, not oriented in time or place, has a hard time in recognizing her caregiver, didn't know her birthday.  Psych--  slt  anxious , no depressed appearing.       Assessment & Plan:    Increased confusion from baseline Patient with history of dementia, reportedly more confused than baseline, labs few weeks ago were satisfactory, history of pneumonia in April, follow-up chest x-ray showed a  decreasing infiltrate. Symptoms could be related to a UTI as suggested by the family, pneumonia, constipation. Plan: UA, urine culture, chest x-ray, treat constipation  Long history of constipation, TSH has been consistently normal, recommend to discontinue calcium supplements. Symptoms probably related to pain  medications but she has severe pain consequently we will not discontinue pain medicines at this time.  takes MiraLAX sometimes, recommend to take MiraLAX, half dose daily  Pain management Almost due for a refill  on fentanyl patch, rx will be provided  Wound Care, Recommend conservative care of the excoriation of the left pretibial area and call if redness or discharge    Follow-up with PCP in a week or 10 days

## 2015-06-15 NOTE — Progress Notes (Signed)
Pre visit review using our clinic review tool, if applicable. No additional management support is needed unless otherwise documented below in the visit note. 

## 2015-06-16 LAB — URINALYSIS, ROUTINE W REFLEX MICROSCOPIC
Bilirubin Urine: NEGATIVE
Hgb urine dipstick: NEGATIVE
Ketones, ur: NEGATIVE
LEUKOCYTES UA: NEGATIVE
NITRITE: NEGATIVE
RBC / HPF: NONE SEEN (ref 0–?)
Specific Gravity, Urine: 1.015 (ref 1.000–1.030)
Total Protein, Urine: NEGATIVE
URINE GLUCOSE: NEGATIVE
UROBILINOGEN UA: 0.2 (ref 0.0–1.0)
pH: 6.5 (ref 5.0–8.0)

## 2015-06-17 ENCOUNTER — Telehealth: Payer: Self-pay | Admitting: Family Medicine

## 2015-06-17 DIAGNOSIS — K5900 Constipation, unspecified: Secondary | ICD-10-CM

## 2015-06-17 DIAGNOSIS — J189 Pneumonia, unspecified organism: Secondary | ICD-10-CM

## 2015-06-17 LAB — URINE CULTURE
Colony Count: NO GROWTH
Organism ID, Bacteria: NO GROWTH

## 2015-06-17 NOTE — Telephone Encounter (Signed)
Spoke with [name], Pt's husband, informed him that Chest x-ray and abdominal x-rays placed at [facility] office for tests to be performed there. Informed [name] that [place] stops x-rays at [facility] on Friday's. [name] verbalized understanding and stated he will be taking Pt on Monday. I also informed [name] that Pt's urine culture was negative for an infection. [name] verbalized understanding.

## 2015-06-17 NOTE — Telephone Encounter (Signed)
Please advise 

## 2015-06-17 NOTE — Telephone Encounter (Signed)
Caller name: Dewanna Hurston Relationship to patient: husband Can be reached: [phone] Pharmacy:  Reason for call: Please change the order for the Chest Xray to [facility] office. Pt is not well enough to make it out to [facility] right now. They did not have the xray done before leaving [place] 06/15/15. The pill that pt took in place of using Miralax is helping and pt has passed bowels, however her stomach is very hard and she is hurting. Pt is not feeling relief after having BM. He is asking if she can have an xray of her stomach as well. Please notify Mr. Goebel.

## 2015-06-17 NOTE — Telephone Encounter (Signed)
That is okay, arrange for the following: Chest x-ray DX follow-up pneumonia Abdominal x-rays, DX constipation Needs to see PCP as recommended

## 2015-06-20 ENCOUNTER — Telehealth: Payer: Self-pay | Admitting: Family Medicine

## 2015-06-20 ENCOUNTER — Ambulatory Visit (INDEPENDENT_AMBULATORY_CARE_PROVIDER_SITE_OTHER)
Admission: RE | Admit: 2015-06-20 | Discharge: 2015-06-20 | Disposition: A | Discharge status: Home / Self Care | Payer: Medicare Other | Source: Ambulatory Visit | Attending: Internal Medicine | Admitting: Internal Medicine

## 2015-06-20 DIAGNOSIS — K5900 Constipation, unspecified: Secondary | ICD-10-CM | POA: Diagnosis not present

## 2015-06-20 DIAGNOSIS — J189 Pneumonia, unspecified organism: Secondary | ICD-10-CM | POA: Diagnosis not present

## 2015-06-20 NOTE — Telephone Encounter (Signed)
Last OV: 06/15/15 with Dr. Larose Kells for similar complaints.    Please advise.

## 2015-06-20 NOTE — Telephone Encounter (Signed)
Patient Name: Kerry Myers DOB: 09-28-1919 Initial Comment Caller states, wife is confused and not making sense Nurse Assessment Nurse: Ronnald Ramp, RN, Miranda Date/Time (Eastern Time): 06/20/2015 11:51:40 AM Confirm and document reason for call. If symptomatic, describe symptoms. ---Caller states his wife had 1/2 tablet of Lorazepam earlier. The last weeks she has been hallucinating and "talking out of her head" more than normal for her. Today she is c/o all over pain. She was seen in the office last week and had test for UTI which was negative. Also order CXR that they are going for today. Has the patient traveled out of the country within the last 30 days? ---Not Applicable Does the patient require triage? ---Yes Related visit to physician within the last 2 weeks? ---Yes Does the PT have any chronic conditions? (i.e. diabetes, asthma, etc.) ---Yes List chronic conditions. ---Dementia, Constipation, Neuropathy, restless leg, Guidelines Guideline Title Affirmed Question Affirmed Notes Confusion - Delirium [1] Longstanding confusion (e.g., dementia, stroke) AND [2] worsening Final Disposition User See Physician within 4 Hours (or PCP triage) Ronnald Ramp, RN, Miranda Comments No appts available and tried to call the office but held for 5 min without an answer. Told caller that pt needs to be seen in the ED. He does not want to go to the ED unless it is absolutely necessary. He is really just calling to see if they can stop the Lorazepam and change to a different medication. He states the longer she has been on the medication the worse she gets. He believes it is related to the medication. He requests a call back.

## 2015-06-20 NOTE — Telephone Encounter (Signed)
Notified and made Mr. Fouty aware of the same.  He stated he was going to stop the medication and once she comes back in from CXR continue to monitor her.  He declined ER at this time.

## 2015-06-20 NOTE — Telephone Encounter (Signed)
Definitely stop lorazapm--- see if behaviour improves ---- I'm afraid other med may do same thing. I do agree with ER--- may need ct or mri brain, neuro eval

## 2015-06-20 NOTE — Telephone Encounter (Signed)
Received call from [place], husband. Kerry Myers pt is talking out of her head and he is worried that it was a reaction to Lorazepam. Pt also believed to have pneumonia per visit last week. Transferred call toRonald with [facility] for evaluation.

## 2015-06-24 ENCOUNTER — Encounter: Payer: Self-pay | Admitting: Internal Medicine

## 2015-06-24 ENCOUNTER — Ambulatory Visit (INDEPENDENT_AMBULATORY_CARE_PROVIDER_SITE_OTHER): Payer: Medicare Other | Admitting: Internal Medicine

## 2015-06-24 VITALS — BP 118/64 | HR 79 | Temp 98.4°F | Resp 20 | Ht <= 58 in | Wt 78.2 lb

## 2015-06-24 DIAGNOSIS — F05 Delirium due to known physiological condition: Secondary | ICD-10-CM

## 2015-06-24 DIAGNOSIS — I2510 Atherosclerotic heart disease of native coronary artery without angina pectoris: Secondary | ICD-10-CM

## 2015-06-24 DIAGNOSIS — K5900 Constipation, unspecified: Secondary | ICD-10-CM

## 2015-06-24 DIAGNOSIS — R410 Disorientation, unspecified: Secondary | ICD-10-CM

## 2015-06-24 NOTE — Progress Notes (Signed)
Subjective:    Patient ID: Kerry Myers Seen, female    DOB: Aug 27, 1919, 81 y.o.   MRN: [id number]  DOS:  06/24/2015 Type of visit - description : f/u, here with Fraser Din one of her caregivers Interval history: Caregiver states that she sees her only twice a week and does not have much information. Reports that her son thinks that she is still confused (above baseline), worse after lorazepam?. She continue with the ill-defined abdominal pain on and off, apparently she takes either hydrocodone or Ultram as needed for stomach pain. She uses fentanyl patch, that was started 2 years ago for hip pain. Not taking Tylenol are all   Review of Systems No fever or chills Chronic poor appetite "for years".   Past Medical History  Diagnosis Date  . GERD (gastroesophageal reflux disease)   . Bladder incontinence   . Neuropathic pain   . RLS (restless legs syndrome)   . Arthritis   . LBBB (left bundle branch block)   . Pneumonia     Past Surgical History  Procedure Laterality Date  . Tonsillectomy    . Abdominal hysterectomy      TVH  . Incontinence surgery    . Excision morton's neuroma    . Eye surgery      cataracts  . Amputation  04/30/2012    Procedure: AMPUTATION DIGIT;  Surgeon: Colin Rhein, MD;  Location: [facility];  Service: Orthopedics;  Laterality: Right;  right 2nd toe amptation through MTP joint    History   Social History  . Marital Status: Widowed    Spouse Name: N/A  . Number of Children: N/A  . Years of Education: N/A   Occupational History  . Not on file.   Social History Main Topics  . Smoking status: Never Smoker   . Smokeless tobacco: Never Used  . Alcohol Use: No  . Drug Use: No  . Sexual Activity: Not on file   Other Topics Concern  . Not on file   Social History Narrative   Lives at home, 2 sons live w/ her    Has care givers 24/7        Medication List       This list is accurate as of: 06/24/15 11:59 PM.  Always use your  most recent med list.               Adult Mask Misc  Oxygen Mask for home Oxygen     albuterol 108 (90 BASE) MCG/ACT inhaler  Commonly known as:  PROVENTIL HFA;VENTOLIN HFA  Inhale 2 puffs into the lungs every 6 (six) hours as needed for wheezing.     feeding supplement (ENSURE ENLIVE) Liqd  Take 237 mLs by mouth 2 (two) times daily between meals.     fentaNYL 12 MCG/HR  Commonly known as:  DURAGESIC - dosed mcg/hr  Place 1 patch (12.5 mcg total) onto the skin every 3 (three) days.     Fish Oil 1000 MG Cpdr  Take 2,000 mg by mouth daily with lunch.     FOLTANX 3-35-2 MG Tabs  Take 1 tablet by mouth two  times daily     gabapentin 100 MG capsule  Commonly known as:  NEURONTIN  Take 2 capsules by mouth  every morning , 2 capsules  by mouth at 2pm and 1  capsule at bedtime     ipratropium-albuterol 0.5-2.5 (3) MG/3ML Soln  Commonly known as:  DUONEB  Take 3 mLs  by nebulization every 6 (six) hours as needed.     MILK OF MAGNESIA 400 MG/5ML suspension  Generic drug:  magnesium hydroxide  Take 30 mLs by mouth daily as needed for mild constipation.     multivitamin with minerals Tabs tablet  Take 1 tablet by mouth every morning.     omeprazole 40 MG capsule  Commonly known as:  PRILOSEC  Take 1 capsule (40 mg total) by mouth at bedtime.     oxybutynin 10 MG 24 hr tablet  Commonly known as:  DITROPAN-XL  Take 1 tablet by mouth  daily     pramipexole 0.5 MG tablet  Commonly known as:  MIRAPEX  Take 1 tablet by mouth at  bedtime     STOOL SOFTENER PO  Take 2 tablets by mouth 2 (two) times daily.     Vitamin D3 5000 UNITS Caps  Take 1 capsule by mouth daily.     zoster vaccine live (PF) 19400 UNT/0.65ML injection  Commonly known as:  ZOSTAVAX  Inject 19,400 Units into the skin once.           Objective:   Physical Exam BP 118/64 mmHg  Pulse 79  Temp(Src) 98.4 F (36.9 C) (Oral)  Resp 20  Ht 4\' 10"  (1.473 m)  Wt 78 lb 4 oz (35.494 kg)  BMI 16.36  kg/m2  General:  pleasently demented undernourished elderly lady in a wheelchair, no distress  HEENT:  Normocephalic . Face symmetric, atraumatic Lungs:  There is good sounds bilaterally Normal respiratory effort, no intercostal retractions, no accessory muscle use. Heart: Distant heart sounds  Abdomen:  Not distended, soft Skin: Not pale. Not jaundice.   Neurologic:  alert , trying to be cooperative,  Recognized her caregiver, nose she is on the medical office, did not remember her birth day, had trouble remembering her breakfast from this morning.      Assessment & Plan:    Confusion, Since the last office visit, the urine culture was negative for UTI, chest x-ray showed no pneumonia. Reportedly she gets more confused with lorazepam. Plan: Was trying to stop medications that mayincreased confusion. Discontinue hydrocodone and Ultram Discontinue lorazepam I also recommended to discontinue the fentanyl patch however Fraser Din is reluctant to do that and states I will be the patient's son decision [name]). F/u in 3 weeks, to see PCP, rec [name] to come w/ pt   Constipation, Caregiver unable to tell me if the frequency of BMs have improved w/ daily miralax ,  recommend to bring a diary at the next visit. She continued to complaining about  on and off  Stomach pain for years. Continue MiraLAX  Pain management See comments under confusion, recommend to use the fentanyl patch only and he if she has pain to use Tylenol as needed

## 2015-06-24 NOTE — Patient Instructions (Signed)
Stop hydrocodone and  Ultram  Stop lorazepam   Continue with the fentanyl patch If she has pain, okay to take Tylenol 500 mg one tablet every 6 hours  Please schedule a visit with Dr. Etter Sjogren in 3 weeks Please come back with  [name]     continue daily MiraLAX

## 2015-06-24 NOTE — Progress Notes (Signed)
Pre visit review using our clinic review tool, if applicable. No additional management support is needed unless otherwise documented below in the visit note. 

## 2015-06-29 ENCOUNTER — Encounter (HOSPITAL_BASED_OUTPATIENT_CLINIC_OR_DEPARTMENT_OTHER): Payer: Medicare Other | Attending: Surgery

## 2015-06-29 DIAGNOSIS — Z89421 Acquired absence of other right toe(s): Secondary | ICD-10-CM | POA: Diagnosis not present

## 2015-06-29 DIAGNOSIS — M1990 Unspecified osteoarthritis, unspecified site: Secondary | ICD-10-CM | POA: Insufficient documentation

## 2015-06-29 DIAGNOSIS — M40209 Unspecified kyphosis, site unspecified: Secondary | ICD-10-CM | POA: Insufficient documentation

## 2015-06-29 DIAGNOSIS — L89121 Pressure ulcer of left upper back, stage 1: Secondary | ICD-10-CM | POA: Diagnosis not present

## 2015-07-06 ENCOUNTER — Telehealth: Payer: Self-pay | Admitting: Family Medicine

## 2015-07-06 NOTE — Telephone Encounter (Signed)
Caller name: fred Relation to pt: son  Call back number: [phone] Pharmacy: [facility]  Reason for call:   Son is Requesting furosemide refill for patient

## 2015-07-07 NOTE — Telephone Encounter (Signed)
Spoke with Kerry Myers and he is requesting a refill for Furosemide, I made him aware that furosemide was a fluid pill. He said never mind she does not need that. He said he found an old pill bottle and did not know what it was for.  He apologized for the inconvenience.     KP

## 2015-07-13 ENCOUNTER — Telehealth: Payer: Self-pay | Admitting: Family Medicine

## 2015-07-13 DIAGNOSIS — G894 Chronic pain syndrome: Secondary | ICD-10-CM

## 2015-07-13 NOTE — Telephone Encounter (Signed)
Caller name: Roxanna Mcever Relationship to patient: husband Can be reached: [phone]  Reason for call: Please refill fentanyl patches. Pt husband will come to pick up. Please call him when RX is ready.

## 2015-07-14 MED ORDER — FENTANYL 12 MCG/HR TD PT72: 12.5000 ug | MEDICATED_PATCH | TRANSDERMAL | Status: DC

## 2015-07-14 NOTE — Telephone Encounter (Signed)
Last seen 04/25/15 and filled 06/15/15 #10  Please advise    KP

## 2015-07-14 NOTE — Telephone Encounter (Signed)
Refill x1 

## 2015-07-14 NOTE — Telephone Encounter (Signed)
Kerry Myers has been made aware that the RX is ready for pick up.     KP

## 2015-07-18 ENCOUNTER — Encounter: Payer: Self-pay | Admitting: Family Medicine

## 2015-07-18 ENCOUNTER — Ambulatory Visit (INDEPENDENT_AMBULATORY_CARE_PROVIDER_SITE_OTHER): Payer: Medicare Other | Admitting: Family Medicine

## 2015-07-18 VITALS — BP 145/73 | HR 75 | Temp 98.3°F | Resp 16 | Ht <= 58 in | Wt 77.6 lb

## 2015-07-18 DIAGNOSIS — L89102 Pressure ulcer of unspecified part of back, stage 2: Secondary | ICD-10-CM | POA: Diagnosis not present

## 2015-07-18 DIAGNOSIS — I2510 Atherosclerotic heart disease of native coronary artery without angina pectoris: Secondary | ICD-10-CM

## 2015-07-18 DIAGNOSIS — K219 Gastro-esophageal reflux disease without esophagitis: Secondary | ICD-10-CM | POA: Diagnosis not present

## 2015-07-18 DIAGNOSIS — N393 Stress incontinence (female) (male): Secondary | ICD-10-CM | POA: Diagnosis not present

## 2015-07-18 DIAGNOSIS — G2581 Restless legs syndrome: Secondary | ICD-10-CM

## 2015-07-18 DIAGNOSIS — R32 Unspecified urinary incontinence: Secondary | ICD-10-CM

## 2015-07-18 MED ORDER — OXYBUTYNIN CHLORIDE ER 10 MG PO TB24: ORAL_TABLET | ORAL | Status: DC

## 2015-07-18 MED ORDER — FOLTANX 3-35-2 MG PO TABS: ORAL_TABLET | ORAL | Status: AC

## 2015-07-18 MED ORDER — GABAPENTIN 100 MG PO CAPS: ORAL_CAPSULE | ORAL | Status: AC

## 2015-07-18 MED ORDER — PRAMIPEXOLE DIHYDROCHLORIDE 0.5 MG PO TABS: ORAL_TABLET | ORAL | Status: AC

## 2015-07-18 MED ORDER — OMEPRAZOLE 40 MG PO CPDR: 40.0000 mg | DELAYED_RELEASE_CAPSULE | Freq: Every day | ORAL | Status: AC

## 2015-07-18 NOTE — Assessment & Plan Note (Signed)
Home health to come to house tomorrow con't wound care Will see if home health can get and special mattress to help with ulcers

## 2015-07-18 NOTE — Patient Instructions (Signed)
Pressure Ulcer A pressure ulcer is a sore that has formed from the breakdown of skin and exposure of deeper layers of tissue. It develops in areas of the body where there is unrelieved pressure. Pressure ulcers are usually found over a bony area, such as the shoulder blades, spine, lower back, hips, knees, ankles, and heels. Pressure ulcers vary in severity. Your health care provider may determine the severity (stage) of your pressure ulcer. The stages include:  Stage I--The skin is red, and when the skin is pressed, it stays red.  Stage II--The top layer of skin is gone, and there is a shallow, pink ulcer.  Stage III--The ulcer becomes deeper, and it is more difficult to see the whole wound. Also, there may be yellow or brown parts, as well as pink and red parts.  Stage IV--The ulcer may be deep and red, pink, brown, white, or yellow. Bone or muscle may be seen.  Unstageable pressure ulcer--The ulcer is covered almost completely with black, brown, or yellow tissue. It is not known how deep the ulcer is or what stage it is until this covering comes off.  Suspected deep tissue injury--A person's skin can be injured from pressure or pulling on the skin when his or her position is changed. The skin appears purple or maroon. There may not be an opening in the skin, but there could be a blood-filled blister. This deep tissue injury is often difficult to see in people with darker skin tones. The site may open and become deeper in time. However, early interventions will help the area heal and may prevent the area from opening. CAUSES  Pressure ulcers are caused by pressure against the skin that limits the flow of blood to the skin and nearby tissues. There are many risk factors that can lead to pressure sores. RISK FACTORS  Decreased ability to move.  Decreased ability to feel pain or discomfort.  Excessive skin moisture from urine, stool, sweat, or secretions.  Poor  nutrition.  Dehydration.  Tobacco, drug, or alcohol abuse.  Having someone pull on bedsheets that are under you, such as when health care workers are changing your position in a hospital bed.  Obesity.  Increased adult age.  Hospitalization in a critical care unit for longer than 4 days with use of medical devices.  Prolonged use of medical devices.  Critical illness.  Anemia.  Traumatic brain injury.  Spinal cord injury.  Stroke.  Diabetes.  Poor blood glucose control.  Low blood pressure (hypotension).  Low oxygen levels.  Medicines that reduce blood flow.  Infection. DIAGNOSIS  Your health care provider will diagnose your pressure ulcer based on its appearance. The health care provider may determine the stage of your pressure ulcer as well. Tests may be done to check for infection, to assess your circulation, or to check for other diseases, such as diabetes. TREATMENT  Treatment of your pressure ulcer begins with determining what stage the ulcer is in. Your treatment team may include your health care provider, a wound care specialist, a nutritionist, a physical therapist, and a surgeon. Possible treatments may include:   Moving or repositioning every 1-2 hours.  Using beds or mattresses to shift your body weight and pressure points frequently.  Improving your diet.  Cleaning and bandaging (dressing) the open wound.  Giving antibiotic medicines.  Removing damaged tissue.  Surgery and sometimes skin grafts. HOME CARE INSTRUCTIONS  If you were hospitalized, follow the care plan that was started in the hospital.    Avoid staying in the same position for more than 2 hours. Use padding, devices, or mattresses to cushion your pressure points as directed by your health care provider.  Eat a well-balanced diet. Take nutritional supplements and vitamins as directed by your health care provider.  Keep all follow-up appointments.  Only take over-the-counter or  prescription medicines for pain, fever, or discomfort as directed by your health care provider. SEEK MEDICAL CARE IF:   Your pressure ulcer is not improving.  You do not know how to care for your pressure ulcer.  You notice other areas of redness on your skin.  You have a fever. SEEK IMMEDIATE MEDICAL CARE IF:   You have increasing redness, swelling, or pain in your pressure ulcer.  You notice pus coming from your pressure ulcer.  You notice a bad smell coming from the wound or dressing.  Your pressure ulcer opens up again. Document Released: 12/17/2005 Document Revised: 12/22/2013 Document Reviewed: 08/24/2013 ExitCare Patient Information 2015 [employer]. This information is not intended to replace advice given to you by your health care provider. Make sure you discuss any questions you have with your health care provider.  

## 2015-07-18 NOTE — Progress Notes (Signed)
Pre visit review using our clinic review tool, if applicable. No additional management support is needed unless otherwise documented below in the visit note. 

## 2015-07-18 NOTE — Progress Notes (Signed)
Patient ID: Kerry Myers, female    DOB: Jan 30, 1919  Age: 81 y.o. MRN: [id number]    Subjective:  Subjective HPI Kerry Myers presents with son and care taker with worsening mid back sore.  Home health was finished but it opened again and they need wound care again. A nurse is coming out tomorrow.   The confusion has improved a lot and is only a problem during the day.    Review of Systems  Constitutional: Negative for activity change, appetite change, fatigue and unexpected weight change.  Respiratory: Negative for cough and shortness of breath.   Cardiovascular: Negative for chest pain and palpitations.  Skin: Positive for color change and wound.  Psychiatric/Behavioral: Negative for behavioral problems and dysphoric mood. The patient is not nervous/anxious.     History Past Medical History  Diagnosis Date  . GERD (gastroesophageal reflux disease)   . Bladder incontinence   . Neuropathic pain   . RLS (restless legs syndrome)   . Arthritis   . LBBB (left bundle branch block)   . Pneumonia     She has past surgical history that includes Tonsillectomy; Abdominal hysterectomy; Incontinence surgery; Excision Morton's neuroma; Eye surgery; and Amputation (04/30/2012).   Her family history is not on file.She reports that she has never smoked. She has never used smokeless tobacco. She reports that she does not drink alcohol or use illicit drugs.  Current Outpatient Prescriptions on File Prior to Visit  Medication Sig Dispense Refill  . albuterol (PROVENTIL HFA;VENTOLIN HFA) 108 (90 BASE) MCG/ACT inhaler Inhale 2 puffs into the lungs every 6 (six) hours as needed for wheezing. 1 Inhaler 0  . Cholecalciferol (VITAMIN D3) 5000 UNITS CAPS Take 1 capsule by mouth daily.    Kerry Myers Calcium (STOOL SOFTENER PO) Take 2 tablets by mouth 2 (two) times daily.    . feeding supplement, ENSURE ENLIVE, (ENSURE ENLIVE) LIQD Take 237 mLs by mouth 2 (two) times daily between meals. 237 mL 12  .  fentaNYL (DURAGESIC - DOSED MCG/HR) 12 MCG/HR Place 1 patch (12.5 mcg total) onto the skin every 3 (three) days. 10 patch 0  . ipratropium-albuterol (DUONEB) 0.5-2.5 (3) MG/3ML SOLN Take 3 mLs by nebulization every 6 (six) hours as needed. 360 mL 0  . Multiple Vitamin (MULTIVITAMIN WITH MINERALS) TABS Take 1 tablet by mouth every morning.     . Omega-3 Fatty Acids (FISH OIL) 1000 MG CPDR Take 2,000 mg by mouth daily with lunch.     . Respiratory Therapy Supplies (ADULT MASK) MISC Oxygen Mask for home Oxygen 1 each 2  . zoster vaccine live, PF, (ZOSTAVAX) 66063 UNT/0.65ML injection Inject 19,400 Units into the skin once. 1 each 0   No current facility-administered medications on file prior to visit.     Objective:  Objective Physical Exam  Constitutional: She is oriented to person, place, and time. She appears well-developed and well-nourished.  HENT:  Head: Normocephalic and atraumatic.  Eyes: Conjunctivae and EOM are normal.  Neck: Normal range of motion. Neck supple. No JVD present. Carotid bruit is not present. No thyromegaly present.  Cardiovascular: Normal rate, regular rhythm and normal heart sounds.   No murmur heard. Pulmonary/Chest: Effort normal and breath sounds normal. No respiratory distress. She has no wheezes. She has no rales. She exhibits no tenderness.  Musculoskeletal: She exhibits no edema.  Neurological: She is alert and oriented to person, place, and time.  Skin:     Psychiatric: She has a normal mood and affect.  BP 145/73 mmHg  Pulse 75  Temp(Src) 98.3 F (36.8 C) (Oral)  Resp 16  Ht 4\' 10"  (1.473 m)  Wt 77 lb 9.6 oz (35.199 kg)  BMI 16.22 kg/m2  SpO2 96% Wt Readings from Last 3 Encounters:  07/18/15 77 lb 9.6 oz (35.199 kg)  06/24/15 78 lb 4 oz (35.494 kg)  06/15/15 77 lb 4 oz (35.04 kg)     Lab Results  Component Value Date   WBC 6.6 04/15/2015   HGB 11.5* 04/15/2015   HCT 36.4 04/15/2015   PLT 206 04/15/2015   GLUCOSE 113* 04/15/2015    CHOL 208* 01/31/2015   TRIG 97.0 01/31/2015   HDL 53.50 01/31/2015   LDLDIRECT 159.4 10/24/2006   LDLCALC 135* 01/31/2015   ALT 35 04/15/2015   AST 41* 04/15/2015   NA 139 04/15/2015   K 3.7 04/15/2015   CL 101 04/15/2015   CREATININE 0.59 04/15/2015   BUN 15 04/15/2015   CO2 29 04/15/2015   TSH 0.711 12/04/2013   HGBA1C 5.6 12/04/2013    Dg Chest 2 View  06/20/2015   CLINICAL DATA:  81 year old female with history of pneumonia. Cough. Followup study.  EXAM: CHEST  2 VIEW  COMPARISON:  Chest x-ray 04/15/2015.  FINDINGS: Lung volumes are very low. Overall, there has been improved aeration in the left base, although there is some residual opacity, favored to reflect some chronic pleuroparenchymal scarring and atelectasis. The possibility of a small left pleural effusion is not excluded. Right lung is clear. No evidence of pulmonary edema. Heart size is normal. Mediastinal contours are distorted by patient's positioning. Large hiatal hernia redemonstrated.  IMPRESSION: 1. Low lung volumes with probable chronic pleural parenchymal scarring at the left lung base. Possible chronic small left pleural effusion is not excluded. 2. Large hiatal hernia.   Electronically Signed   By: Vinnie Langton M.D.   On: 06/20/2015 16:26   Dg Abd 2 Views  06/20/2015   CLINICAL DATA:  Abdominal pain and constipation.  EXAM: ABDOMEN - 2 VIEW  COMPARISON:  CT 07/21/2012 .  FINDINGS: Soft tissue structures are unremarkable. Large calcific density left upper quadrant most likely left renal stone. No bowel distention. Moderate amount of stool noted throughout the colon. Pelvic calcifications consistent phleboliths. Prior hernia repair. Severe degenerative changes lumbar spine with scoliosis concave left. Left lower lobe atelectasis and/or infiltrate with small left pleural effusion.  IMPRESSION: 1. Left nephrolithiasis. 2. Peripheral vascular disease. 3. No evidence of bowel distention or free air. Moderate amount of stool  noted throughout colon. 4. Left lower lobe atelectasis and/or infiltrate with small left pleural effusion   Electronically Signed   By: Dana   On: 06/20/2015 16:25     Assessment & Plan:  Plan I have discontinued Ms. Mallinger's magnesium hydroxide. I am also having her maintain her multivitamin with minerals, Vitamin D3, Docusate Calcium (STOOL SOFTENER PO), albuterol, Fish Oil, feeding supplement (ENSURE ENLIVE), zoster vaccine live (PF), ipratropium-albuterol, Adult Mask, fentaNYL, acetaminophen, Polyethylene Glycol 3350 (MIRALAX PO), omeprazole, pramipexole, oxybutynin, FOLTANX, and gabapentin.  Meds ordered this encounter  Medications  . acetaminophen (TYLENOL) 325 MG tablet    Sig: Take 325-650 mg by mouth every 6 (six) hours as needed.  . Polyethylene Glycol 3350 (MIRALAX PO)    Sig: Take by mouth.  Marland Kitchen omeprazole (PRILOSEC) 40 MG capsule    Sig: Take 1 capsule (40 mg total) by mouth at bedtime.    Dispense:  90 capsule    Refill:  3    DISCONTINUE ALL PREVIOUS REFILLS FOR THIS MEDICATION  . pramipexole (MIRAPEX) 0.5 MG tablet    Sig: Take 1 tablet by mouth at  bedtime    Dispense:  90 tablet    Refill:  3    DISCONTINUE ALL PREVIOUS REFILLS FOR THIS MEDICATION  . oxybutynin (DITROPAN-XL) 10 MG 24 hr tablet    Sig: Take 1 tablet by mouth  daily    Dispense:  90 tablet    Refill:  1    DISCONTINUE ALL PREVIOUS REFILLS FOR THIS MEDICATION  . L-Methylfolate-B6-B12 (FOLTANX) 3-35-2 MG TABS    Sig: Take 1 tablet by mouth two  times daily    Dispense:  180 tablet    Refill:  1    DISCONTINUE ALL PREVIOUS REFILLS FOR THIS MEDICATION  . gabapentin (NEURONTIN) 100 MG capsule    Sig: Take 2 capsules by mouth  every morning , 2 capsules  by mouth at 2pm and 1  capsule at bedtime    Dispense:  450 capsule    Refill:  3    DISCONTINUE ALL PREVIOUS REFILLS FOR THIS MEDICATION    Problem List Items Addressed This Visit    RLS (restless legs syndrome)   Relevant Medications     pramipexole (MIRAPEX) 0.5 MG tablet   GERD   Relevant Medications   Polyethylene Glycol 3350 (MIRALAX PO)   omeprazole (PRILOSEC) 40 MG capsule   Decubitus ulcer of upper back, stage 2 - Primary   Relevant Orders   Ambulatory referral to [facility]    Other Visit Diagnoses    Incontinence in female        Relevant Medications    oxybutynin (DITROPAN-XL) 10 MG 24 hr tablet       Follow-up: Return in about 4 weeks (around 08/15/2015), or if symptoms worsen or fail to improve, for f/u bed sore.  Garnet Koyanagi, DO

## 2015-07-25 ENCOUNTER — Telehealth: Payer: Self-pay | Admitting: *Deleted

## 2015-07-25 NOTE — Telephone Encounter (Signed)
Home health certification and plan of care received via fax from [facility] for certification period 06/05/15 - 08/03/15. Forwarded to Dr. Etter Sjogren. JG//CMA

## 2015-07-29 NOTE — Telephone Encounter (Signed)
Faxed successfully to [facility]. Sent for scanning. JG//CMA 

## 2015-08-04 ENCOUNTER — Telehealth: Payer: Self-pay | Admitting: *Deleted

## 2015-08-04 DIAGNOSIS — L89152 Pressure ulcer of sacral region, stage 2: Secondary | ICD-10-CM

## 2015-08-04 NOTE — Telephone Encounter (Signed)
Home health certification and plan of care received via fax from [facility]. Certification period: 08/04/2015 - 10/02/2015. Forwarded to Dr. Etter Sjogren. JG//CMA

## 2015-08-05 DIAGNOSIS — Z0279 Encounter for issue of other medical certificate: Secondary | ICD-10-CM

## 2015-08-08 ENCOUNTER — Telehealth: Payer: Self-pay | Admitting: *Deleted

## 2015-08-08 NOTE — Telephone Encounter (Signed)
Received FMLA forms from pt's son, Rosaland Shiffman, for intermittent leave for appointments. Filled out as much as possible and forwarded to Dr. Etter Sjogren. JG//CMA

## 2015-08-09 NOTE — Telephone Encounter (Signed)
Faxed successfully to [facility]. Copy sent for scanning. JG//CMA

## 2015-08-15 ENCOUNTER — Ambulatory Visit: Payer: Medicare Other | Admitting: Family Medicine

## 2015-08-15 ENCOUNTER — Telehealth: Payer: Self-pay | Admitting: Family Medicine

## 2015-08-15 DIAGNOSIS — G894 Chronic pain syndrome: Secondary | ICD-10-CM

## 2015-08-15 MED ORDER — FENTANYL 12 MCG/HR TD PT72: 12.5000 ug | MEDICATED_PATCH | TRANSDERMAL | Status: DC

## 2015-08-15 NOTE — Telephone Encounter (Signed)
Refill x1 but I think she missed an appointment today

## 2015-08-15 NOTE — Telephone Encounter (Signed)
Rx printed. Please read the note below.     KP

## 2015-08-15 NOTE — Telephone Encounter (Signed)
Reason for call: Pt husband states she will need fentaNYL (DURAGESIC - DOSED MCG/HR) 12 MCG/HR. Can he pick up RX Friday 08/19/15? Please call when ready. Maryjean Ka left VM 08/15/15 8:36am to cancel appt since home care nursing/wound care is coming 2xweek. States they will f/u after home care stops.

## 2015-08-15 NOTE — Telephone Encounter (Signed)
Last seen 07/18/15 and filled 07/14/15 #10   Please advise    KP

## 2015-08-16 NOTE — Telephone Encounter (Signed)
Forms faxed. JG//CMA

## 2015-08-17 ENCOUNTER — Telehealth: Payer: Self-pay | Admitting: *Deleted

## 2015-08-17 DIAGNOSIS — L89102 Pressure ulcer of unspecified part of back, stage 2: Secondary | ICD-10-CM

## 2015-08-17 NOTE — Telephone Encounter (Signed)
Received home health certification and plan of care via fax from [facility] for certification period 06/05/15 - 08/03/15. Forwarded to Dr. Etter Sjogren. JG//CMA

## 2015-08-18 NOTE — Telephone Encounter (Signed)
Faxed successfully

## 2015-09-07 ENCOUNTER — Telehealth: Payer: Self-pay | Admitting: Family Medicine

## 2015-09-07 DIAGNOSIS — H9193 Unspecified hearing loss, bilateral: Secondary | ICD-10-CM

## 2015-09-07 NOTE — Telephone Encounter (Signed)
Pt audiologist is now requiring referral for hearing test.   [facility]  Address: [address], [place], [place] [address]  Phone: [phone]

## 2015-09-07 NOTE — Telephone Encounter (Signed)
Ref placed.      KP 

## 2015-09-15 ENCOUNTER — Telehealth: Payer: Self-pay | Admitting: Family Medicine

## 2015-09-15 DIAGNOSIS — G894 Chronic pain syndrome: Secondary | ICD-10-CM

## 2015-09-15 MED ORDER — FENTANYL 12 MCG/HR TD PT72: 12.5000 ug | MEDICATED_PATCH | TRANSDERMAL | Status: DC

## 2015-09-15 NOTE — Telephone Encounter (Signed)
The patient has enough to get her through until Monday, I made [name] aware, they can pick up the Rx on Monday and he verbalized understanding.   KP

## 2015-09-15 NOTE — Telephone Encounter (Signed)
Refill x1 

## 2015-09-15 NOTE — Telephone Encounter (Signed)
Caller name: Rosezetta Balderston Relationship to patient: spouse    Can be reached: [phone] Pharmacy:  Reason for call: pt's spouse called in to request a refill on  fentaNYL Rx.

## 2015-09-15 NOTE — Telephone Encounter (Signed)
Last seen 07/18/15 and filled 08/15/15 #10  Please advise     KP

## 2015-09-19 ENCOUNTER — Telehealth: Payer: Self-pay | Admitting: Family Medicine

## 2015-09-19 NOTE — Telephone Encounter (Signed)
Caller name:Stephanie from [facility]  Relation to pt: LPN  Call back number: [phone]    Reason for call:   Requesting verbal orders for blood work CBC and CMP

## 2015-09-20 NOTE — Telephone Encounter (Signed)
Spoke with Kerry Myers and the patient is having LBP and orthostatic Hypertension. gave her orders to also add and UA and culture and A1c per Dr.[name]. She verbalized understanding.      KP

## 2015-10-21 ENCOUNTER — Encounter: Payer: Self-pay | Admitting: Family Medicine

## 2015-10-21 ENCOUNTER — Telehealth: Payer: Self-pay

## 2015-10-21 DIAGNOSIS — G894 Chronic pain syndrome: Secondary | ICD-10-CM

## 2015-10-21 MED ORDER — FENTANYL 12 MCG/HR TD PT72: 12.5000 ug | MEDICATED_PATCH | TRANSDERMAL | Status: DC

## 2015-10-21 NOTE — Telephone Encounter (Signed)
Refill x1 

## 2015-10-21 NOTE — Telephone Encounter (Signed)
Last seen 07/08/15 and filled 09/15/15 #10   Please advise     KP

## 2015-10-21 NOTE — Telephone Encounter (Signed)
VM Left advising Rx ready for pick up.     KP 

## 2015-10-21 NOTE — Telephone Encounter (Signed)
Son called to get refill for Fentanyl patient used last one applied today please call patient when ready

## 2015-10-31 ENCOUNTER — Encounter: Payer: Self-pay | Admitting: Family Medicine

## 2015-10-31 ENCOUNTER — Ambulatory Visit (INDEPENDENT_AMBULATORY_CARE_PROVIDER_SITE_OTHER): Payer: Medicare Other | Admitting: Family Medicine

## 2015-10-31 ENCOUNTER — Telehealth: Payer: Self-pay | Admitting: *Deleted

## 2015-10-31 VITALS — BP 121/69 | Temp 97.7°F | Resp 16 | Wt 81.6 lb

## 2015-10-31 DIAGNOSIS — I2510 Atherosclerotic heart disease of native coronary artery without angina pectoris: Secondary | ICD-10-CM

## 2015-10-31 DIAGNOSIS — Z23 Encounter for immunization: Secondary | ICD-10-CM

## 2015-10-31 DIAGNOSIS — M79602 Pain in left arm: Secondary | ICD-10-CM

## 2015-10-31 DIAGNOSIS — L89153 Pressure ulcer of sacral region, stage 3: Secondary | ICD-10-CM | POA: Diagnosis not present

## 2015-10-31 DIAGNOSIS — E43 Unspecified severe protein-calorie malnutrition: Secondary | ICD-10-CM

## 2015-10-31 DIAGNOSIS — M79601 Pain in right arm: Secondary | ICD-10-CM | POA: Diagnosis not present

## 2015-10-31 DIAGNOSIS — G894 Chronic pain syndrome: Secondary | ICD-10-CM | POA: Diagnosis not present

## 2015-10-31 MED ORDER — FENTANYL 12 MCG/HR TD PT72
12.5000 ug | MEDICATED_PATCH | TRANSDERMAL | Status: DC
Start: 2015-10-31 — End: 2015-11-15

## 2015-10-31 NOTE — Patient Instructions (Signed)

## 2015-10-31 NOTE — Assessment & Plan Note (Signed)
Apparently this has been bothering the pt for over a year.  Caretakers are unable to touch pt in arms -- she flinches in pain.  She c/o bumps on her arms-- we are not able to see anything today.  Pt has a dermatologist she can go to but prefer s not to at this time.   She also c/o pain from elbow down---- offered ortho referral to see about nerve impingement--- pt wants to wait.

## 2015-10-31 NOTE — Assessment & Plan Note (Signed)
Check labs with cpe Pt has been off statin due to weight loss

## 2015-10-31 NOTE — Progress Notes (Signed)
Patient ID: Kerry Myers, female    DOB: 12-Jul-1919  Age: 81 y.o. MRN: [id number]    Subjective:  Subjective HPI Kerry Myers presents for f/u -- she is still c/o toe pain and is taking tylenol bid   She has c/o arm pain but today it does not seem to bother her although the more we talke about it the more it starts to bother her.   Pt c/o "bumps" on her arm that are painful.  Pt  Has gained a little weight and is drinking protein shakes.      Review of Systems  Constitutional: Negative for diaphoresis, appetite change, fatigue and unexpected weight change.  Eyes: Negative for pain, redness and visual disturbance.  Respiratory: Negative for cough, chest tightness, shortness of breath and wheezing.   Cardiovascular: Negative for chest pain, palpitations and leg swelling.  Endocrine: Negative for cold intolerance, heat intolerance, polydipsia, polyphagia and polyuria.  Genitourinary: Negative for dysuria, frequency and difficulty urinating.  Neurological: Negative for dizziness, light-headedness, numbness and headaches.  All other systems reviewed and are negative.   History Past Medical History  Diagnosis Date  . GERD (gastroesophageal reflux disease)   . Bladder incontinence   . Neuropathic pain   . RLS (restless legs syndrome)   . Arthritis   . LBBB (left bundle branch block)   . Pneumonia     She has past surgical history that includes Tonsillectomy; Abdominal hysterectomy; Incontinence surgery; Excision Morton's neuroma; Eye surgery; and Amputation (04/30/2012).   Her family history is not on file.She reports that she has never smoked. She has never used smokeless tobacco. She reports that she does not drink alcohol or use illicit drugs.  Current Outpatient Prescriptions on File Prior to Visit  Medication Sig Dispense Refill  . acetaminophen (TYLENOL) 325 MG tablet Take 325-650 mg by mouth every 6 (six) hours as needed.    Marland Kitchen albuterol (PROVENTIL HFA;VENTOLIN HFA) 108 (90 BASE)  MCG/ACT inhaler Inhale 2 puffs into the lungs every 6 (six) hours as needed for wheezing. 1 Inhaler 0  . Cholecalciferol (VITAMIN D3) 5000 UNITS CAPS Take 1 capsule by mouth daily.    [name] Calcium (STOOL SOFTENER PO) Take 2 tablets by mouth 2 (two) times daily.    Marland Kitchen gabapentin (NEURONTIN) 100 MG capsule Take 2 capsules by mouth  every morning , 2 capsules  by mouth at 2pm and 1  capsule at bedtime 450 capsule 3  . ipratropium-albuterol (DUONEB) 0.5-2.5 (3) MG/3ML SOLN Take 3 mLs by nebulization every 6 (six) hours as needed. 360 mL 0  . L-Methylfolate-B6-B12 (FOLTANX) 3-35-2 MG TABS Take 1 tablet by mouth two  times daily 180 tablet 1  . Multiple Vitamin (MULTIVITAMIN WITH MINERALS) TABS Take 1 tablet by mouth every morning.     . Omega-3 Fatty Acids (FISH OIL) 1000 MG CPDR Take 2,000 mg by mouth daily with lunch.     Marland Kitchen omeprazole (PRILOSEC) 40 MG capsule Take 1 capsule (40 mg total) by mouth at bedtime. 90 capsule 3  . oxybutynin (DITROPAN-XL) 10 MG 24 hr tablet Take 1 tablet by mouth  daily 90 tablet 1  . Polyethylene Glycol 3350 (MIRALAX PO) Take by mouth.    . pramipexole (MIRAPEX) 0.5 MG tablet Take 1 tablet by mouth at  bedtime 90 tablet 3  . Respiratory Therapy Supplies (ADULT MASK) MISC Oxygen Mask for home Oxygen 1 each 2   No current facility-administered medications on file prior to visit.  Objective:  Objective Physical Exam  Constitutional: She appears well-developed and well-nourished.  HENT:  Head: Normocephalic and atraumatic.  Eyes: Conjunctivae and EOM are normal.  Neck: Normal range of motion. Neck supple. No JVD present. Carotid bruit is not present. No thyromegaly present.  Cardiovascular: Normal rate, regular rhythm and normal heart sounds.   No murmur heard. Pulmonary/Chest: Effort normal and breath sounds normal. No respiratory distress. She has no wheezes. She has no rales. She exhibits no tenderness.  Musculoskeletal: She exhibits no edema.    Neurological: She is alert.  Skin: Skin is warm and dry. No rash noted.  Psychiatric: She has a normal mood and affect. Her behavior is normal.   BP 121/69 mmHg  Temp(Src) 97.7 F (36.5 C) (Oral)  Resp 16  Wt 81 lb 9.6 oz (37.014 kg) Wt Readings from Last 3 Encounters:  10/31/15 81 lb 9.6 oz (37.014 kg)  07/18/15 77 lb 9.6 oz (35.199 kg)  06/24/15 78 lb 4 oz (35.494 kg)     Lab Results  Component Value Date   WBC 6.6 04/15/2015   HGB 11.5* 04/15/2015   HCT 36.4 04/15/2015   PLT 206 04/15/2015   GLUCOSE 113* 04/15/2015   CHOL 208* 01/31/2015   TRIG 97.0 01/31/2015   HDL 53.50 01/31/2015   LDLDIRECT 159.4 10/24/2006   LDLCALC 135* 01/31/2015   ALT 35 04/15/2015   AST 41* 04/15/2015   NA 139 04/15/2015   K 3.7 04/15/2015   CL 101 04/15/2015   CREATININE 0.59 04/15/2015   BUN 15 04/15/2015   CO2 29 04/15/2015   TSH 0.711 12/04/2013   HGBA1C 5.6 12/04/2013    Dg Chest 2 View  06/20/2015  CLINICAL DATA:  81 year old female with history of pneumonia. Cough. Followup study. EXAM: CHEST  2 VIEW COMPARISON:  Chest x-ray 04/15/2015. FINDINGS: Lung volumes are very low. Overall, there has been improved aeration in the left base, although there is some residual opacity, favored to reflect some chronic pleuroparenchymal scarring and atelectasis. The possibility of a small left pleural effusion is not excluded. Right lung is clear. No evidence of pulmonary edema. Heart size is normal. Mediastinal contours are distorted by patient's positioning. Large hiatal hernia redemonstrated. IMPRESSION: 1. Low lung volumes with probable chronic pleural parenchymal scarring at the left lung base. Possible chronic small left pleural effusion is not excluded. 2. Large hiatal hernia. Electronically Signed   By: Vinnie Langton M.D.   On: 06/20/2015 16:26   Dg Abd 2 Views  06/20/2015  CLINICAL DATA:  Abdominal pain and constipation. EXAM: ABDOMEN - 2 VIEW COMPARISON:  CT 07/21/2012 . FINDINGS: Soft  tissue structures are unremarkable. Large calcific density left upper quadrant most likely left renal stone. No bowel distention. Moderate amount of stool noted throughout the colon. Pelvic calcifications consistent phleboliths. Prior hernia repair. Severe degenerative changes lumbar spine with scoliosis concave left. Left lower lobe atelectasis and/or infiltrate with small left pleural effusion. IMPRESSION: 1. Left nephrolithiasis. 2. Peripheral vascular disease. 3. No evidence of bowel distention or free air. Moderate amount of stool noted throughout colon. 4. Left lower lobe atelectasis and/or infiltrate with small left pleural effusion Electronically Signed   By: Adams   On: 06/20/2015 16:25     Assessment & Plan:  Plan I have discontinued Kerry Myers's feeding supplement (ENSURE ENLIVE) and zoster vaccine live (PF). I am also having her maintain her multivitamin with minerals, Vitamin D3, Docusate Calcium (STOOL SOFTENER PO), albuterol, Fish Oil, ipratropium-albuterol, Adult Mask, acetaminophen,  Polyethylene Glycol 3350 (MIRALAX PO), omeprazole, pramipexole, oxybutynin, FOLTANX, gabapentin, and fentaNYL.  Meds ordered this encounter  Medications  . fentaNYL (DURAGESIC - DOSED MCG/HR) 12 MCG/HR    Sig: Place 1 patch (12.5 mcg total) onto the skin every 3 (three) days.    Dispense:  10 patch    Refill:  0    Problem List Items Addressed This Visit    Bilateral arm pain    Apparently this has been bothering the pt for over a year.  Caretakers are unable to touch pt in arms -- she flinches in pain.  She c/o bumps on her arms-- we are not able to see anything today.  Pt has a dermatologist she can go to but prefer s not to at this time.   She also c/o pain from elbow down---- offered ortho referral to see about nerve impingement--- pt wants to wait.         Other Visit Diagnoses    Chronic pain syndrome    -  Primary    Relevant Medications    fentaNYL (DURAGESIC - DOSED MCG/HR)  12 MCG/HR    Need for prophylactic vaccination and inoculation against influenza        Relevant Orders    Flu vaccine HIGH DOSE PF (Fluzone High dose) (Completed)       Follow-up: Return in about 6 months (around 04/29/2016), or if symptoms worsen or fail to improve.  Garnet Koyanagi, DO

## 2015-10-31 NOTE — Progress Notes (Signed)
Pre visit review using our clinic review tool, if applicable. No additional management support is needed unless otherwise documented below in the visit note. 

## 2015-10-31 NOTE — Assessment & Plan Note (Signed)
con't with protein milk shakes

## 2015-10-31 NOTE — Telephone Encounter (Signed)
Home health cert & POC received via fax from [facility] for cert period: 84/7/84-12/08/19. Forwarded to Dr. Etter Sjogren. JG//CMA

## 2015-11-02 NOTE — Telephone Encounter (Signed)
Signed forms faxed to [facility] successfully. Sent for scanning. JG//CMA 

## 2015-11-11 ENCOUNTER — Telehealth: Payer: Self-pay | Admitting: Family Medicine

## 2015-11-11 ENCOUNTER — Emergency Department (HOSPITAL_COMMUNITY): Payer: Medicare Other

## 2015-11-11 ENCOUNTER — Encounter (HOSPITAL_COMMUNITY): Payer: Self-pay | Admitting: Emergency Medicine

## 2015-11-11 ENCOUNTER — Inpatient Hospital Stay (HOSPITAL_COMMUNITY)
Admission: EM | Admit: 2015-11-11 | Discharge: 2015-11-15 | DRG: 689 | Disposition: A | Discharge status: Skilled Nursing Facility | Payer: Medicare Other | Attending: Internal Medicine | Admitting: Internal Medicine

## 2015-11-11 DIAGNOSIS — G609 Hereditary and idiopathic neuropathy, unspecified: Secondary | ICD-10-CM | POA: Diagnosis present

## 2015-11-11 DIAGNOSIS — Z8249 Family history of ischemic heart disease and other diseases of the circulatory system: Secondary | ICD-10-CM

## 2015-11-11 DIAGNOSIS — R197 Diarrhea, unspecified: Secondary | ICD-10-CM | POA: Diagnosis present

## 2015-11-11 DIAGNOSIS — M4120 Other idiopathic scoliosis, site unspecified: Secondary | ICD-10-CM | POA: Diagnosis present

## 2015-11-11 DIAGNOSIS — R638 Other symptoms and signs concerning food and fluid intake: Secondary | ICD-10-CM

## 2015-11-11 DIAGNOSIS — Z681 Body mass index (BMI) 19 or less, adult: Secondary | ICD-10-CM

## 2015-11-11 DIAGNOSIS — R1312 Dysphagia, oropharyngeal phase: Secondary | ICD-10-CM | POA: Diagnosis present

## 2015-11-11 DIAGNOSIS — E43 Unspecified severe protein-calorie malnutrition: Secondary | ICD-10-CM | POA: Diagnosis present

## 2015-11-11 DIAGNOSIS — Z79899 Other long term (current) drug therapy: Secondary | ICD-10-CM

## 2015-11-11 DIAGNOSIS — N390 Urinary tract infection, site not specified: Principal | ICD-10-CM | POA: Diagnosis present

## 2015-11-11 DIAGNOSIS — N1330 Unspecified hydronephrosis: Secondary | ICD-10-CM | POA: Diagnosis present

## 2015-11-11 DIAGNOSIS — I951 Orthostatic hypotension: Secondary | ICD-10-CM | POA: Diagnosis present

## 2015-11-11 DIAGNOSIS — F0390 Unspecified dementia without behavioral disturbance: Secondary | ICD-10-CM | POA: Diagnosis present

## 2015-11-11 DIAGNOSIS — R531 Weakness: Secondary | ICD-10-CM | POA: Diagnosis not present

## 2015-11-11 DIAGNOSIS — R42 Dizziness and giddiness: Secondary | ICD-10-CM

## 2015-11-11 DIAGNOSIS — Z66 Do not resuscitate: Secondary | ICD-10-CM | POA: Diagnosis present

## 2015-11-11 DIAGNOSIS — K219 Gastro-esophageal reflux disease without esophagitis: Secondary | ICD-10-CM | POA: Diagnosis present

## 2015-11-11 DIAGNOSIS — B952 Enterococcus as the cause of diseases classified elsewhere: Secondary | ICD-10-CM | POA: Diagnosis present

## 2015-11-11 DIAGNOSIS — R413 Other amnesia: Secondary | ICD-10-CM | POA: Diagnosis present

## 2015-11-11 DIAGNOSIS — G894 Chronic pain syndrome: Secondary | ICD-10-CM | POA: Diagnosis present

## 2015-11-11 DIAGNOSIS — E860 Dehydration: Secondary | ICD-10-CM | POA: Diagnosis present

## 2015-11-11 DIAGNOSIS — R05 Cough: Secondary | ICD-10-CM

## 2015-11-11 DIAGNOSIS — R64 Cachexia: Secondary | ICD-10-CM | POA: Diagnosis present

## 2015-11-11 DIAGNOSIS — M419 Scoliosis, unspecified: Secondary | ICD-10-CM | POA: Diagnosis present

## 2015-11-11 DIAGNOSIS — G2581 Restless legs syndrome: Secondary | ICD-10-CM | POA: Diagnosis present

## 2015-11-11 DIAGNOSIS — G629 Polyneuropathy, unspecified: Secondary | ICD-10-CM | POA: Diagnosis present

## 2015-11-11 DIAGNOSIS — R627 Adult failure to thrive: Secondary | ICD-10-CM | POA: Diagnosis present

## 2015-11-11 DIAGNOSIS — L89102 Pressure ulcer of unspecified part of back, stage 2: Secondary | ICD-10-CM | POA: Diagnosis present

## 2015-11-11 DIAGNOSIS — R32 Unspecified urinary incontinence: Secondary | ICD-10-CM | POA: Diagnosis present

## 2015-11-11 DIAGNOSIS — R058 Other specified cough: Secondary | ICD-10-CM

## 2015-11-11 DIAGNOSIS — M810 Age-related osteoporosis without current pathological fracture: Secondary | ICD-10-CM | POA: Diagnosis present

## 2015-11-11 LAB — URINALYSIS, ROUTINE W REFLEX MICROSCOPIC
BILIRUBIN URINE: NEGATIVE
GLUCOSE, UA: NEGATIVE mg/dL
HGB URINE DIPSTICK: NEGATIVE
Ketones, ur: NEGATIVE mg/dL
Nitrite: NEGATIVE
PROTEIN: NEGATIVE mg/dL
SPECIFIC GRAVITY, URINE: 1.014 (ref 1.005–1.030)
UROBILINOGEN UA: 0.2 mg/dL (ref 0.0–1.0)
pH: 7.5 (ref 5.0–8.0)

## 2015-11-11 LAB — CBC WITH DIFFERENTIAL/PLATELET
Basophils Absolute: 0 10*3/uL (ref 0.0–0.1)
Basophils Relative: 0 %
EOS PCT: 3 %
Eosinophils Absolute: 0.2 10*3/uL (ref 0.0–0.7)
HEMATOCRIT: 37.3 % (ref 36.0–46.0)
HEMOGLOBIN: 12 g/dL (ref 12.0–15.0)
LYMPHS ABS: 1.8 10*3/uL (ref 0.7–4.0)
LYMPHS PCT: 23 %
MCH: 30.8 pg (ref 26.0–34.0)
MCHC: 32.2 g/dL (ref 30.0–36.0)
MCV: 95.6 fL (ref 78.0–100.0)
Monocytes Absolute: 0.6 10*3/uL (ref 0.1–1.0)
Monocytes Relative: 8 %
NEUTROS PCT: 66 %
Neutro Abs: 5.3 10*3/uL (ref 1.7–7.7)
Platelets: 218 10*3/uL (ref 150–400)
RBC: 3.9 MIL/uL (ref 3.87–5.11)
RDW: 13.5 % (ref 11.5–15.5)
WBC: 8 10*3/uL (ref 4.0–10.5)

## 2015-11-11 LAB — COMPREHENSIVE METABOLIC PANEL
ALK PHOS: 60 U/L (ref 38–126)
ALT: 13 U/L — AB (ref 14–54)
AST: 22 U/L (ref 15–41)
Albumin: 3.7 g/dL (ref 3.5–5.0)
Anion gap: 7 (ref 5–15)
BUN: 25 mg/dL — ABNORMAL HIGH (ref 6–20)
CALCIUM: 9.9 mg/dL (ref 8.9–10.3)
CO2: 33 mmol/L — ABNORMAL HIGH (ref 22–32)
CREATININE: 0.45 mg/dL (ref 0.44–1.00)
Chloride: 100 mmol/L — ABNORMAL LOW (ref 101–111)
Glucose, Bld: 92 mg/dL (ref 65–99)
Potassium: 3.9 mmol/L (ref 3.5–5.1)
Sodium: 140 mmol/L (ref 135–145)
Total Bilirubin: 0.5 mg/dL (ref 0.3–1.2)
Total Protein: 7.1 g/dL (ref 6.5–8.1)

## 2015-11-11 LAB — URINE MICROSCOPIC-ADD ON

## 2015-11-11 MED ORDER — SODIUM CHLORIDE 0.9 % IV BOLUS (SEPSIS)
1000.0000 mL | Freq: Once | INTRAVENOUS | Status: AC
  Administered 2015-11-11: 1000 mL via INTRAVENOUS

## 2015-11-11 MED ORDER — SODIUM CHLORIDE 0.9 % IV BOLUS (SEPSIS): 500.0000 mL | Freq: Once | INTRAVENOUS | Status: DC

## 2015-11-11 MED ORDER — SODIUM CHLORIDE 0.9 % IV BOLUS (SEPSIS)
500.0000 mL | Freq: Once | INTRAVENOUS | Status: AC
  Administered 2015-11-11: 500 mL via INTRAVENOUS

## 2015-11-11 NOTE — Telephone Encounter (Signed)
[facility]  Patient Name: Kerry Myers  DOB: 02/09/1919    Initial Comment Caller states Stephanie(nurse) with [facility]. P is dizzy. Unable to stand or walk with walker. General weakness.    Nurse Assessment  Nurse: Harlow Mares, RN, Suanne Marker Date/Time Eilene Ghazi Time): 11/11/2015 5:16:14 PM  Confirm and document reason for call. If symptomatic, describe symptoms. ---Caller states Stephanie(nurse) with [facility]. Pt is dizzy. Unable to stand or walk with walker. General weakness. Reports that vitals are stable. Having dizziness. Orthostatic hypotension: 90/70. has had a cough, runny nose (clear mucous), but decreased liquids. Reports UOP is adequate.  Has the patient traveled out of the country within the last 30 days? ---No  Does the patient have any new or worsening symptoms? ---Yes  Will a triage be completed? ---Yes  Related visit to physician within the last 2 weeks? ---Yes  Does the PT have any chronic conditions? (i.e. diabetes, asthma, etc.) ---Yes  List chronic conditions. ---pressure ulcer sacrum; COPD; heart disease     Guidelines    Guideline Title Affirmed Question Affirmed Notes  Weakness (Generalized) and Fatigue [1] SEVERE weakness (i.e., unable to walk or barely able to walk, requires support) AND [2] new onset or worsening    Final Disposition User   Call EMS 911 Now Harlow Mares, RN, Suanne Marker    Disagree/Comply: Leta Baptist

## 2015-11-11 NOTE — ED Notes (Signed)
Pt here via EMS from home with complaints of weakness x 1 week. Pt denies n/v/d. Pt was seen in her home by a LPN who suggested she come here to be assessed and called EMS. Pt did have orthostatic vital sign changes (120/70 sitting to 90/50 standing). EMS started a bolus in route. Pt has a Left bundle branch block that is known. Pt is alert and oriented x 4 and lives alone.

## 2015-11-11 NOTE — ED Provider Notes (Signed)
CSN: [id number]     Arrival date & time 11/11/15  1907 History   First Kerry Myers Initiated Contact with Patient 11/11/15 2100     Chief Complaint  Patient presents with  . Weakness    for a week     (Consider location/radiation/quality/duration/timing/severity/associated sxs/prior Treatment) HPI   Kerry Myers is a 81 y.o. female, with a history of GERD, presenting to the [facility] with non-productive cough, weakness, and dizziness for the past week. Pt also states she has been eating and drinking less for the past week. Pt states her dizziness comes on most when she stands up or turns her head. Denies N/V/C/D, neuro deficits, LOC, chest pain, shortness of breath, or any other complaints. Pt accompanied by son and daughter-in-law at bedside. EMS reports patient was orthostatic on scene. Pt has a history of urinary incontinence and states this is why she doesn't want to drink a lot of water. Pt uses 12.46mcg fentanyl patches q3 days and was last changed two days ago.  Past Medical History  Diagnosis Date  . GERD (gastroesophageal reflux disease)   . Bladder incontinence   . Neuropathic pain   . RLS (restless legs syndrome)   . Arthritis   . LBBB (left bundle branch block)   . Pneumonia    Past Surgical History  Procedure Laterality Date  . Tonsillectomy    . Abdominal hysterectomy      TVH  . Incontinence surgery    . Excision morton's neuroma    . Eye surgery      cataracts  . Amputation  04/30/2012    Procedure: AMPUTATION DIGIT;  Surgeon: Colin Rhein, Kerry Myers;  Location: [place];  Service: Orthopedics;  Laterality: Right;  right 2nd toe amptation through MTP joint   No family history on file. Social History  Substance Use Topics  . Smoking status: Never Smoker   . Smokeless tobacco: Never Used  . Alcohol Use: No   OB History    No data available     Review of Systems  Constitutional: Negative for fever, chills and diaphoresis.  Respiratory: Positive for cough.  Negative for chest tightness and shortness of breath.   Cardiovascular: Negative for chest pain.  Gastrointestinal: Negative for nausea, vomiting, abdominal pain, diarrhea and constipation.  Neurological: Positive for dizziness and weakness.  All other systems reviewed and are negative.     Allergies  Review of patient's allergies indicates no known allergies.  Home Medications   Prior to Admission medications   Medication Sig Start Date End Date Taking? Authorizing Provider  Cholecalciferol (VITAMIN D3) 5000 UNITS CAPS Take 1 capsule by mouth daily.   Yes Historical Provider, Kerry Myers  Docusate Calcium (STOOL SOFTENER PO) Take 2 tablets by mouth 2 (two) times daily.   Yes Historical Provider, Kerry Myers  fentaNYL (DURAGESIC - DOSED MCG/HR) 12 MCG/HR Place 1 patch (12.5 mcg total) onto the skin every 3 (three) days. 10/31/15  Yes Kerry Chessman, Kerry Myers  gabapentin (NEURONTIN) 100 MG capsule Take 2 capsules by mouth  every morning , 2 capsules  by mouth at 2pm and 1  capsule at bedtime 07/18/15  Yes Kerry Chessman, Kerry Myers  L-Methylfolate-B6-B12 Lebron Quam) 3-35-2 MG TABS Take 1 tablet by mouth two  times daily 07/18/15  Yes Kerry Chessman, Kerry Myers  Magnesium 250 MG TABS Take 1 tablet by mouth daily.   Yes Historical Provider, Kerry Myers  Multiple Vitamin (MULTIVITAMIN WITH MINERALS) TABS Take 1 tablet by mouth every morning.  Yes Historical Provider, Kerry Myers  Omega-3 Fatty Acids (FISH OIL) 1000 MG CPDR Take 2,000 mg by mouth daily with lunch.    Yes Historical Provider, Kerry Myers  omeprazole (PRILOSEC) 40 MG capsule Take 1 capsule (40 mg total) by mouth at bedtime. 07/18/15  Yes Kerry Chessman, Kerry Myers  oxybutynin (DITROPAN-XL) 10 MG 24 hr tablet Take 1 tablet by mouth  daily 07/18/15  Yes Kerry Chessman, Kerry Myers  pramipexole (MIRAPEX) 0.5 MG tablet Take 1 tablet by mouth at  bedtime 07/18/15  Yes Kerry Chessman, Kerry Myers  vitamin C (ASCORBIC ACID) 500 MG tablet Take 500 mg by mouth daily.   Yes Historical Provider, Kerry Myers  acetaminophen (TYLENOL) 325 MG  tablet Take 325-650 mg by mouth every 6 (six) hours as needed.    Historical Provider, Kerry Myers  albuterol (PROVENTIL HFA;VENTOLIN HFA) 108 (90 BASE) MCG/ACT inhaler Inhale 2 puffs into the lungs every 6 (six) hours as needed for wheezing. 03/27/13   Kerry Canterbury, Kerry Myers  cephALEXin (KEFLEX) 500 MG capsule Take 1 capsule (500 mg total) by mouth 2 (two) times daily. 11/12/15   Kerry Einstein C Gracynn Rajewski, Kerry Myers  ipratropium-albuterol (DUONEB) 0.5-2.5 (3) MG/3ML SOLN Take 3 mLs by nebulization every 6 (six) hours as needed. 04/27/15   Kerry Males, Kerry Myers  Respiratory Therapy Supplies (ADULT MASK) MISC Oxygen Mask for home Oxygen 05/09/15   Kerry Apa [name], Kerry Myers   BP 167/98 mmHg  Pulse 93  Temp(Src) 98.2 F (36.8 C) (Oral)  Resp 13  SpO2 93% Physical Exam  Constitutional: She is oriented to person, place, and time. She appears well-developed and well-nourished. No distress.  HENT:  Head: Normocephalic and atraumatic.  Right Ear: External ear normal.  Left Ear: External ear normal.  Nose: Nose normal.  Mouth/Throat: Oropharynx is clear and moist.  Eyes: Conjunctivae and EOM are normal. Pupils are equal, round, and reactive to light.  Neck: Normal range of motion. Neck supple.  Cardiovascular: Normal rate, regular rhythm, normal heart sounds and intact distal pulses.   Pulmonary/Chest: Effort normal and breath sounds normal. No respiratory distress.  Abdominal: Soft. Bowel sounds are normal.  Musculoskeletal: Normal range of motion. She exhibits no edema or tenderness.  Lymphadenopathy:    She has no cervical adenopathy.  Neurological: She is alert and oriented to person, place, and time. She has normal reflexes.  No sensory deficits. Strength 5/5 in all extremities. Cranial nerves II-XII grossly intact.  Skin: Skin is warm and dry. She is not diaphoretic.  Skin tenting present  Nursing note and vitals reviewed.   Kerry Myers Course  Procedures (including critical care time) Labs Review Labs Reviewed  COMPREHENSIVE  METABOLIC PANEL - Abnormal; Notable for the following:    Chloride 100 (*)    CO2 33 (*)    BUN 25 (*)    ALT 13 (*)    All other components within normal limits  URINALYSIS, ROUTINE W REFLEX MICROSCOPIC (NOT AT [facility]) - Abnormal; Notable for the following:    APPearance CLOUDY (*)    Leukocytes, UA TRACE (*)    All other components within normal limits  URINE MICROSCOPIC-ADD ON - Abnormal; Notable for the following:    Squamous Epithelial / LPF FEW (*)    Bacteria, UA MANY (*)    All other components within normal limits  URINE CULTURE  CBC WITH DIFFERENTIAL/PLATELET  Kerry Myers, Kerry Myers    Imaging Review Dg Chest 2 View  11/11/2015  CLINICAL DATA:  Weakness for 1 week.  Cough, productive. EXAM: CHEST  2 VIEW COMPARISON:  06/20/2015 FINDINGS: Small left pleural effusion which is chronic based on previous imaging. There is associated mild atelectasis. No edema, consolidation, or air leak. Chronic borderline cardiomegaly. Moderate hiatal hernia with gas fluid level. No acute osseous finding. IMPRESSION: 1. Baseline appearance of the chest. No evidence of acute cardiopulmonary disease. 2. Chronic small left pleural effusion and basilar scarring. 3. Moderate hiatal hernia. Electronically Signed   By: Monte Fantasia M.D.   On: 11/11/2015 22:29   I have personally reviewed and evaluated these images and lab results as part of my medical decision-making.   EKG Interpretation   Date/Time:  Friday November 11 2015 21:23:48 EST Ventricular Rate:  65 PR Interval:  242 QRS Duration: 117 QT Interval:  422 QTC Calculation: 439 R Axis:   -122 Text Interpretation:  Sinus rhythm Atrial premature complexes Prolonged PR  interval Nonspecific intraventricular conduction delay Anterolateral  infarct, old No significant change since last tracing Confirmed by  Kerry Myers, Kerry Myers [phone]) on 11/11/2015 11:31:44 PM      MDM   Final diagnoses:  Weakness  Dizziness  Nonproductive cough   Inadequate oral intake  Dehydration    Kerry Myers presents with generalized weakness, dizziness, nonproductive cough, and poor oral intake for the last week.  Findings and plan of care discussed with Kerry Dakin, Kerry Myers.  Suspect this patient is dehydrated due to poor oral intake combined with possibly viral illness. Patient be rehydrated with IV fluids, ambulated, and then discharged home with instructions to continue sufficient oral intake. Discussed plan of care with patient and her family, all parties agree to the plan. Patient was reevaluated. Patient states that she feels better than when she got here. Patient and patient's family are both comfortable with discharge upon completion of IV fluids, negative orthostatics, and adequate ambulation. 1:09 AM went back to the patient's room to reassess her, however patient is using the bed pan. RN reports that pt has been actively and intermittently having a BM over the course of about an hour. No hematochezia or melena. This may explain some of why pt has been feeling the way she has. Patient does not have orthostatic vital sign changes here in the [facility] after about a liter and a half of fluids. Patient has maintained her oxygen saturation and has not been tachycardic since admission to the [facility]. UA results give questionable evidence for possible UTI.  2:27 AM End of shift patient care handoff report given to Kerry Lange, Kerry Myers.   Kerry Bender, Kerry Myers 11/12/15 0228  Kerry Dakin, Kerry Myers 11/12/15 1131

## 2015-11-11 NOTE — ED Notes (Signed)
EKG given to EDP,Nanavati,MD., for review. 

## 2015-11-11 NOTE — ED Notes (Signed)
Pt to xray

## 2015-11-11 NOTE — ED Notes (Signed)
Bed: [facility] Expected date:  Expected time:  Means of arrival:  Comments: EMS 81yo weakness

## 2015-11-11 NOTE — ED Notes (Signed)
EDP at pt bedside

## 2015-11-11 NOTE — ED Notes (Signed)
Nurse drawing labs. 

## 2015-11-11 NOTE — ED Provider Notes (Signed)
Complains of generalized weakness worse with standing and cough for process with the past week accompanied by mild shortness of breath. Denies abdominal pain, bowel movements normal on exam alert no distress, chronically ill-appearing, cachectic lungs clear to auscultation heart regular rate and rhythm abdomen nondistended extremities without edema. Patient felt to be mildly dehydrated  Orlie Dakin, MD 11/12/15 0130

## 2015-11-12 ENCOUNTER — Emergency Department (HOSPITAL_COMMUNITY): Payer: Medicare Other

## 2015-11-12 ENCOUNTER — Encounter (HOSPITAL_COMMUNITY): Payer: Self-pay | Admitting: Family Medicine

## 2015-11-12 DIAGNOSIS — R627 Adult failure to thrive: Secondary | ICD-10-CM | POA: Diagnosis present

## 2015-11-12 DIAGNOSIS — R1312 Dysphagia, oropharyngeal phase: Secondary | ICD-10-CM | POA: Diagnosis present

## 2015-11-12 DIAGNOSIS — R413 Other amnesia: Secondary | ICD-10-CM | POA: Diagnosis present

## 2015-11-12 DIAGNOSIS — K219 Gastro-esophageal reflux disease without esophagitis: Secondary | ICD-10-CM | POA: Diagnosis present

## 2015-11-12 DIAGNOSIS — M419 Scoliosis, unspecified: Secondary | ICD-10-CM | POA: Diagnosis present

## 2015-11-12 DIAGNOSIS — E43 Unspecified severe protein-calorie malnutrition: Secondary | ICD-10-CM | POA: Diagnosis present

## 2015-11-12 DIAGNOSIS — N1330 Unspecified hydronephrosis: Secondary | ICD-10-CM | POA: Diagnosis present

## 2015-11-12 DIAGNOSIS — Z8249 Family history of ischemic heart disease and other diseases of the circulatory system: Secondary | ICD-10-CM | POA: Diagnosis not present

## 2015-11-12 DIAGNOSIS — M810 Age-related osteoporosis without current pathological fracture: Secondary | ICD-10-CM | POA: Diagnosis present

## 2015-11-12 DIAGNOSIS — Z681 Body mass index (BMI) 19 or less, adult: Secondary | ICD-10-CM | POA: Diagnosis not present

## 2015-11-12 DIAGNOSIS — F0390 Unspecified dementia without behavioral disturbance: Secondary | ICD-10-CM | POA: Diagnosis present

## 2015-11-12 DIAGNOSIS — R531 Weakness: Secondary | ICD-10-CM

## 2015-11-12 DIAGNOSIS — R64 Cachexia: Secondary | ICD-10-CM | POA: Diagnosis present

## 2015-11-12 DIAGNOSIS — G609 Hereditary and idiopathic neuropathy, unspecified: Secondary | ICD-10-CM | POA: Diagnosis present

## 2015-11-12 DIAGNOSIS — N390 Urinary tract infection, site not specified: Secondary | ICD-10-CM | POA: Diagnosis present

## 2015-11-12 DIAGNOSIS — I951 Orthostatic hypotension: Secondary | ICD-10-CM | POA: Diagnosis present

## 2015-11-12 DIAGNOSIS — E860 Dehydration: Secondary | ICD-10-CM | POA: Diagnosis present

## 2015-11-12 DIAGNOSIS — G2581 Restless legs syndrome: Secondary | ICD-10-CM | POA: Diagnosis present

## 2015-11-12 DIAGNOSIS — G894 Chronic pain syndrome: Secondary | ICD-10-CM | POA: Diagnosis present

## 2015-11-12 DIAGNOSIS — Z66 Do not resuscitate: Secondary | ICD-10-CM | POA: Diagnosis present

## 2015-11-12 DIAGNOSIS — R32 Unspecified urinary incontinence: Secondary | ICD-10-CM | POA: Diagnosis present

## 2015-11-12 DIAGNOSIS — G629 Polyneuropathy, unspecified: Secondary | ICD-10-CM | POA: Diagnosis present

## 2015-11-12 DIAGNOSIS — R197 Diarrhea, unspecified: Secondary | ICD-10-CM | POA: Diagnosis present

## 2015-11-12 DIAGNOSIS — B952 Enterococcus as the cause of diseases classified elsewhere: Secondary | ICD-10-CM | POA: Diagnosis present

## 2015-11-12 DIAGNOSIS — L89102 Pressure ulcer of unspecified part of back, stage 2: Secondary | ICD-10-CM | POA: Diagnosis present

## 2015-11-12 DIAGNOSIS — Z79899 Other long term (current) drug therapy: Secondary | ICD-10-CM | POA: Diagnosis not present

## 2015-11-12 LAB — I-STAT TROPONIN, ED: TROPONIN I, POC: 0 ng/mL (ref 0.00–0.08)

## 2015-11-12 MED ORDER — ONDANSETRON HCL 4 MG/2ML IJ SOLN
INTRAMUSCULAR | Status: AC
  Filled 2015-11-12: qty 2

## 2015-11-12 MED ORDER — ACETAMINOPHEN 325 MG PO TABS: 650.0000 mg | ORAL_TABLET | Freq: Four times a day (QID) | ORAL | Status: DC | PRN

## 2015-11-12 MED ORDER — SODIUM CHLORIDE 0.9 % IV SOLN
INTRAVENOUS | Status: DC
  Administered 2015-11-12: 15:00:00 via INTRAVENOUS
  Administered 2015-11-12: 500 mL via INTRAVENOUS
  Administered 2015-11-13: 04:00:00 via INTRAVENOUS

## 2015-11-12 MED ORDER — ACETAMINOPHEN 650 MG RE SUPP: 650.0000 mg | Freq: Four times a day (QID) | RECTAL | Status: DC | PRN

## 2015-11-12 MED ORDER — ONDANSETRON HCL 4 MG/2ML IJ SOLN
4.0000 mg | Freq: Once | INTRAMUSCULAR | Status: AC
  Administered 2015-11-12: 4 mg via INTRAVENOUS

## 2015-11-12 MED ORDER — SODIUM CHLORIDE 0.9 % IV SOLN
Freq: Once | INTRAVENOUS | Status: AC
  Administered 2015-11-12: 05:00:00 via INTRAVENOUS

## 2015-11-12 MED ORDER — ONDANSETRON HCL 4 MG PO TABS: 4.0000 mg | ORAL_TABLET | Freq: Four times a day (QID) | ORAL | Status: DC | PRN

## 2015-11-12 MED ORDER — MORPHINE SULFATE (PF) 2 MG/ML IV SOLN
2.0000 mg | Freq: Once | INTRAVENOUS | Status: AC
  Administered 2015-11-12: 2 mg via INTRAVENOUS
  Filled 2015-11-12: qty 1

## 2015-11-12 MED ORDER — SENNOSIDES-DOCUSATE SODIUM 8.6-50 MG PO TABS
1.0000 | ORAL_TABLET | Freq: Every evening | ORAL | Status: DC | PRN
Start: 2015-11-12 — End: 2015-11-15
  Administered 2015-11-14: 1 via ORAL
  Filled 2015-11-12: qty 1

## 2015-11-12 MED ORDER — IOHEXOL 300 MG/ML  SOLN
75.0000 mL | Freq: Once | INTRAMUSCULAR | Status: AC | PRN
  Administered 2015-11-12: 75 mL via INTRAVENOUS

## 2015-11-12 MED ORDER — ONDANSETRON HCL 4 MG/2ML IJ SOLN: 4.0000 mg | Freq: Three times a day (TID) | INTRAMUSCULAR | Status: DC | PRN

## 2015-11-12 MED ORDER — HALOPERIDOL LACTATE 5 MG/ML IJ SOLN
INTRAMUSCULAR | Status: AC
  Filled 2015-11-12: qty 1

## 2015-11-12 MED ORDER — ONDANSETRON HCL 4 MG/2ML IJ SOLN: 4.0000 mg | Freq: Four times a day (QID) | INTRAMUSCULAR | Status: DC | PRN

## 2015-11-12 MED ORDER — MORPHINE SULFATE (PF) 2 MG/ML IV SOLN
2.0000 mg | INTRAVENOUS | Status: DC | PRN
  Administered 2015-11-12 – 2015-11-13 (×2): 2 mg via INTRAVENOUS
  Filled 2015-11-12 (×2): qty 1

## 2015-11-12 MED ORDER — ALBUTEROL SULFATE (2.5 MG/3ML) 0.083% IN NEBU: 3.0000 mL | INHALATION_SOLUTION | Freq: Four times a day (QID) | RESPIRATORY_TRACT | Status: DC | PRN

## 2015-11-12 MED ORDER — GABAPENTIN 100 MG PO CAPS
200.0000 mg | ORAL_CAPSULE | Freq: Two times a day (BID) | ORAL | Status: DC
  Administered 2015-11-12 – 2015-11-13 (×2): 200 mg via ORAL
  Filled 2015-11-12 (×2): qty 2

## 2015-11-12 MED ORDER — DEXTROSE 5 % IV SOLN
1.0000 g | INTRAVENOUS | Status: DC
  Administered 2015-11-12 – 2015-11-14 (×3): 1 g via INTRAVENOUS
  Filled 2015-11-12 (×3): qty 10

## 2015-11-12 MED ORDER — FENTANYL 12 MCG/HR TD PT72
12.5000 ug | MEDICATED_PATCH | TRANSDERMAL | Status: DC
  Administered 2015-11-12 – 2015-11-15 (×2): 12.5 ug via TRANSDERMAL
  Filled 2015-11-12 (×2): qty 1

## 2015-11-12 MED ORDER — ENOXAPARIN SODIUM 30 MG/0.3ML ~~LOC~~ SOLN
30.0000 mg | SUBCUTANEOUS | Status: DC
  Administered 2015-11-13 – 2015-11-14 (×2): 30 mg via SUBCUTANEOUS
  Filled 2015-11-12 (×2): qty 0.3

## 2015-11-12 MED ORDER — PRAMIPEXOLE DIHYDROCHLORIDE 0.25 MG PO TABS
0.5000 mg | ORAL_TABLET | Freq: Every day | ORAL | Status: DC
  Administered 2015-11-12 – 2015-11-14 (×3): 0.5 mg via ORAL
  Filled 2015-11-12 (×4): qty 2

## 2015-11-12 MED ORDER — HALOPERIDOL LACTATE 5 MG/ML IJ SOLN
1.0000 mg | Freq: Four times a day (QID) | INTRAMUSCULAR | Status: DC | PRN
  Administered 2015-11-12: 1 mg via INTRAVENOUS

## 2015-11-12 MED ORDER — CEPHALEXIN 500 MG PO CAPS: 500.0000 mg | ORAL_CAPSULE | Freq: Two times a day (BID) | ORAL | Status: DC

## 2015-11-12 NOTE — ED Notes (Signed)
Pt. Has back pain and too weak to ambulate.

## 2015-11-12 NOTE — ED Provider Notes (Signed)
Patient signed out at end of shift from [place], [place]. 81 yo from home with attentive family at bedside, complaining of positional dizziness for one week. No falls, no LOC, no fever. She has had decreased appetite through the week and has become progressively weaker. She was seen by her home health nurse today and found to be in need of assessment and was sent to the ED. She denies N, V, D or constipation. No recent changes in medications. Mild headache. No history of vertigo. No chest pain, cough or breathing difficulty although she reports mild SOB with exertion. Per EMS patient orthostatic on their assessment.  Patient received fluids in ED, lab studies and imaging performed. On re-examination, the patient feels worse - now with diarrhea, abdominal cramping and nausea. She began vomiting non-bloody emesis on my assessment. Zofran provided. She feels weaker. Head CT ordered in evaluation of dizziness with vomiting, but suspect evolving GI illness. Urine (clean catch) with many bacteria but few leukocytes. She does report dysuria today. Culture pending.   Plan - admit to hospital for hydration, symptom control and improvement back to baseline function.  On re-evaluation, the patient continues to have nausea, temporarily better with Zofran. She complains of pain in her abdomen, now pain in her lower back, requiring pain medication. CT abd/pel pending to r/o intra-abdominal bleeding. Discussed advanced directives with the family who states she has a living will and does not want heroic efforts unless there is a reasonable chance of returning to baseline function.  Discussed with hospitalist for admission.   Charlann Lange, PA-C 11/12/15 [place], PA-C 11/12/15 [id number]  Orlie Dakin, MD 11/12/15 1130

## 2015-11-12 NOTE — Evaluation (Signed)
Clinical/Bedside Swallow Evaluation Patient Details  Name: Kerry Myers MRN: [id number] Date of Birth: [birth year]  Today's Date: 11/12/2015 Time: SLP Start Time (ACUTE ONLY): 1450 SLP Stop Time (ACUTE ONLY): 1515 SLP Time Calculation (min) (ACUTE ONLY): 25 min  Past Medical History:  Past Medical History  Diagnosis Date  . GERD (gastroesophageal reflux disease)   . Bladder incontinence   . Neuropathic pain   . RLS (restless legs syndrome)   . Arthritis   . LBBB (left bundle branch block)   . Pneumonia    Past Surgical History:  Past Surgical History  Procedure Laterality Date  . Tonsillectomy    . Abdominal hysterectomy      TVH  . Incontinence surgery    . Excision morton's neuroma    . Eye surgery      cataracts  . Amputation  04/30/2012    Procedure: AMPUTATION DIGIT;  Surgeon: Colin Rhein, MD;  Location: [facility];  Service: Orthopedics;  Laterality: Right;  right 2nd toe amptation through MTP joint   HPI:  81 y.o. female with a past medical history significant for ptosis, dementia severe, severe neuropathy, and restless leg syndrome who presents with dizziness. All history was collected from the patient's son and daughter-in-law on 11/11/15 as the patient's dementia is too severe. Family reports that over the last 6 months, the patient has had progressive memory loss, weakness, and weight loss. She lives at home with them and has caretakers who come in to help her with certain activities. She is independent with angina with a walker and using the bathroom but is otherwise dependent.  Caregiver stated she has intermittent episodes of strangling during meals at home.     Assessment / Plan / Recommendation Clinical Impression   Pt did not exhibit overt s/s of aspiration with thin (varying volumes) through solids with familiar caregiver present during BSE; her neck was hyper-extended initially for evaluation; but with cueing, she was able to bring her head to  a neutral position for remainder of BSE.  Caregiver reports intermittent strangling at home during meals and one incident in the last 6 months of PNA, which may have been an aspiration event.  Education provided re: presbyphagia and changes in swallowing over time and modifications to prevent or reduce aspiration.  Recommend Dysphagia 3/thin with small sips/bites with straws allowed during meals.  Pt agreed to Dysphagia 3 diet d/t ill-fitting dentures and difficulty with masticating certain meats/breads.  ST to f/u for diet tolerance/aspiration precautions/ swallowing recommendations.    Aspiration Risk  Mild aspiration risk    Diet Recommendation   Dysphagia 3 (mechanical soft)/thin liquids (small sips)  Medication Administration: Whole meds with puree    Other  Recommendations Oral Care Recommendations: Oral care BID   Follow up Recommendations  24 hour supervision/assistance    Frequency and Duration min 2x/week  1 week       Swallow Study   General Date of Onset: 11/12/15 HPI: 81 y.o. female with a past medical history significant for ptosis, dementia severe, severe neuropathy, and restless leg syndrome who presents with dizziness. Type of Study: Bedside Swallow Evaluation Previous Swallow Assessment: n/a Diet Prior to this Study: Regular;Thin liquids Temperature Spikes Noted: No Respiratory Status: Room air History of Recent Intubation: No Behavior/Cognition: Alert;Cooperative;Confused Oral Cavity Assessment: Dry Oral Care Completed by SLP: Other (Comment) (pt had lunch tray prior to BSE) Oral Cavity - Dentition: Dentures, top;Dentures, bottom Vision: Functional for self-feeding Self-Feeding Abilities: Needs assist;Needs set  up Patient Positioning: Upright in bed Baseline Vocal Quality: Breathy;Low vocal intensity Volitional Cough: Weak Volitional Swallow: Able to elicit    Oral/Motor/Sensory Function Overall Oral Motor/Sensory Function: Within functional limits   Ice  Chips Ice chips: Within functional limits Presentation: Spoon   Thin Liquid Thin Liquid: Within functional limits Presentation: Cup;Spoon;Straw    Nectar Thick Nectar Thick Liquid: Not tested   Honey Thick Honey Thick Liquid: Not tested   Puree Puree: Within functional limits Presentation: Spoon   Solid Solid: Within functional limits Presentation: Self Fed       Heath Tesler,PAT, M.S., CCC-SLP 11/12/2015,3:24 PM

## 2015-11-12 NOTE — Progress Notes (Signed)
PT Cancellation Note  Patient Details Name: Kerry Myers MRN: [id number] DOB: 23-Oct-1919   Cancelled Treatment:    Reason Eval/Treat Not Completed: Patient's level of consciousness;Fatigue/lethargy limiting ability to participate (caregiver states that patient is very tired.)   Claretha Cooper 11/12/2015, 4:50 PM

## 2015-11-12 NOTE — Progress Notes (Signed)
[facility] PROGRESS NOTE   JAMYIAH VEENSTRA [id number] DOB: [birth year] DOA: 11/11/2015 PCP: Garnet Koyanagi, DO  HPI/Subjective: Seen with son and daughter-in-law at bedside. Feels much better after she had in and out catheterization, about 1 L of urine output.  Assessment/Plan: Principal Problem:   Weakness Active Problems:   Hereditary and idiopathic peripheral neuropathy   SCOLIOSIS, THORACOLUMBAR   Dysphagia, oropharyngeal phase   RLS (restless legs syndrome)   Protein-calorie malnutrition, severe (HCC)   Decubitus ulcer of upper back, stage 2   Diarrhea   Orthostatic hypotension   This is no chart notes, patient seen earlier today by my colleague Dr. Loleta Books. Patient seen and examined, data base reviewed. Came in with dizziness and generalized weakness, also have some urinary retention. UTI, started on Rocephin, follow cultures, in/out catheter as needed.  Code Status: DNR Family Communication: Plan discussed with the patient. Disposition Plan: Remains inpatient Diet: Diet regular Room service appropriate?: Yes; Fluid consistency:: Thin  Consultants:  None  Procedures:  None  Antibiotics:  Rocephin (indicate start date, and stop date if known)   Objective: Filed Vitals:   11/12/15 0708  BP: 139/71  Pulse: 90  Temp: 97.6 F (36.4 C)  Resp: 20    Intake/Output Summary (Last 24 hours) at 11/12/15 1050 Last data filed at 11/12/15 0806  Gross per 24 hour  Intake   1000 ml  Output      0 ml  Net   1000 ml   There were no vitals filed for this visit.  Exam: General: Alert and awake, oriented x3, not in any acute distress. HEENT: anicteric sclera, pupils reactive to light and accommodation, EOMI CVS: S1-S2 clear, no murmur rubs or gallops Chest: clear to auscultation bilaterally, no wheezing, rales or rhonchi Abdomen: soft nontender, nondistended, normal bowel sounds, no organomegaly Extremities: no cyanosis, clubbing or edema noted  bilaterally Neuro: Cranial nerves II-XII intact, no focal neurological deficits  Data Reviewed: Basic Metabolic Panel:  Recent Labs Lab 11/11/15 2131  NA 140  K 3.9  CL 100*  CO2 33*  GLUCOSE 92  BUN 25*  CREATININE 0.45  CALCIUM 9.9   Liver Function Tests:  Recent Labs Lab 11/11/15 2131  AST 22  ALT 13*  ALKPHOS 60  BILITOT 0.5  PROT 7.1  ALBUMIN 3.7   No results for input(s): LIPASE, AMYLASE in the last 168 hours. No results for input(s): AMMONIA in the last 168 hours. CBC:  Recent Labs Lab 11/11/15 2131  WBC 8.0  NEUTROABS 5.3  HGB 12.0  HCT 37.3  MCV 95.6  PLT 218   Cardiac Enzymes: No results for input(s): CKTOTAL, CKMB, CKMBINDEX, TROPONINI in the last 168 hours. BNP (last 3 results)  Recent Labs  04/15/15 0020  BNP 195.4*    ProBNP (last 3 results) No results for input(s): PROBNP in the last 8760 hours.  CBG: No results for input(s): GLUCAP in the last 168 hours.  Micro No results found for this or any previous visit (from the past 240 hour(s)).   Studies: Dg Chest 2 View  11/11/2015  CLINICAL DATA:  Weakness for 1 week.  Cough, productive. EXAM: CHEST  2 VIEW COMPARISON:  06/20/2015 FINDINGS: Small left pleural effusion which is chronic based on previous imaging. There is associated mild atelectasis. No edema, consolidation, or air leak. Chronic borderline cardiomegaly. Moderate hiatal hernia with gas fluid level. No acute osseous finding. IMPRESSION: 1. Baseline appearance of the chest. No evidence of acute cardiopulmonary disease. 2. Chronic small  left pleural effusion and basilar scarring. 3. Moderate hiatal hernia. Electronically Signed   By: Monte Fantasia M.D.   On: 11/11/2015 22:29   Ct Head Wo Contrast  11/12/2015  CLINICAL DATA:  Acute onset of generalized weakness for 1 week. Mild shortness of breath. Initial encounter. EXAM: CT HEAD WITHOUT CONTRAST TECHNIQUE: Contiguous axial images were obtained from the base of the skull  through the vertex without intravenous contrast. COMPARISON:  CT of the head performed 04/10/2015 FINDINGS: There is no evidence of acute infarction, mass lesion, or intra- or extra-axial hemorrhage on CT. Prominence of the ventricles and sulci reflects moderate cortical volume loss. Mild cerebellar atrophy is noted. Scattered periventricular and subcortical white matter change likely reflects small vessel ischemic microangiopathy. Mild chronic ischemic change is noted at the posterior limb of the left internal capsule. The brainstem and fourth ventricle are within normal limits. The cerebral hemispheres demonstrate grossly normal gray-white differentiation. No mass effect or midline shift is seen. There is no evidence of fracture; visualized osseous structures are unremarkable in appearance. The orbits are within normal limits. The paranasal sinuses and mastoid air cells are well-aerated. No significant soft tissue abnormalities are seen. IMPRESSION: 1. No acute intracranial pathology seen on CT. 2. Moderate cortical volume loss and scattered small vessel ischemic microangiopathy. 3. Mild chronic ischemic change at the posterior limb of the left internal capsule. Electronically Signed   By: Garald Balding M.D.   On: 11/12/2015 04:00   Ct Abdomen Pelvis W Contrast  11/12/2015  CLINICAL DATA:  Decreased appetite and progressive weakness for 1 week. EXAM: CT ABDOMEN AND PELVIS WITH CONTRAST TECHNIQUE: Multidetector CT imaging of the abdomen and pelvis was performed using the standard protocol following bolus administration of intravenous contrast. CONTRAST:  76mL OMNIPAQUE IOHEXOL 300 MG/ML  SOLN COMPARISON:  07/21/2012 FINDINGS: Lower chest and abdominal wall: Chronic cardiomegaly with extensive coronary atherosclerosis. Moderate sliding-type hiatal hernia. Small bilateral layering pleural effusion with left more than right lower lobe atelectasis. Hepatobiliary: Low-density appearance of the liver is likely from  contrast timing. No focal lesion is seen.No evidence of biliary obstruction or stone. Pancreas: Chronically dilated distal main duct of 5 mm. No visible obstructive process or inflammation. Spleen: Unremarkable. Adrenals/Urinary Tract: Negative adrenals. Mild bilateral hydronephrosis from dilated urinary bladder. Bilateral renal cortical thinning with superimposed patchy right renal cortical scarring. Markedly dilated bladder with dependent debris or hemorrhage. Reproductive:Hysterectomy and negative adnexae. Stomach/Bowel: Extensive formed stool. Distal colonic diverticulosis without evidence of active inflammation. No indication of bowel obstruction. No pericecal inflammation. Vascular/Lymphatic: Extensive aortic and branch vessel atherosclerosis. No acute vascular finding. No mass or adenopathy. Peritoneal: Trace ascites, potentially reactive to the bladder findings. Musculoskeletal: Severe and diffuse facet and disc degeneration with dextroscoliosis. Osteopenia which is profound. No acute osseous finding IMPRESSION: 1. Overdistended bladder (causing hydronephrosis) with layering debris or hemorrhage. Correlate with urinalysis. 2. Small bilateral pleural effusion with atelectasis. 3. Numerous chronic findings are stable from 2013 and described above. Electronically Signed   By: Monte Fantasia M.D.   On: 11/12/2015 05:39    Scheduled Meds: . cefTRIAXone (ROCEPHIN)  IV  1 g Intravenous Q24H  . enoxaparin (LOVENOX) injection  30 mg Subcutaneous Q24H  . fentaNYL  12.5 mcg Transdermal Q72H  . ondansetron       Continuous Infusions: . sodium chloride 500 mL (11/12/15 0748)       Time spent: 35 minutes    [facility] Pager [phone] If 7PM-7AM, please contact night-coverage at  [email or web address], password [facility] 11/12/2015, 10:50 AM  LOS: 0 days

## 2015-11-12 NOTE — ED Notes (Signed)
Patient transported to CT 

## 2015-11-12 NOTE — Progress Notes (Signed)
Pt became agitated shortly after start of shift. Trying to get out of bed, swatting at staff, shrieking loudly. Also pulling at foley and IV lines. Daughter at bedside. Paged PA and received order for haldol 1mg  IV. Pt became calm and went to sleep shortly thereafter. However, she did wake back up around 2130 trying to get out of bed again saying she needed to use the bathroom. She has a foley, bladder scan was checked to make sure bladder wasn't full- 0cc. Daughter said pt usually takes meds for RLS at night which were not ordered. Paged PA and received an order for these. Will monitor for effect. Daughter remains at beside. Hortencia Conradi RN

## 2015-11-12 NOTE — ED Notes (Signed)
The pt has had an off and on BM for about an hr, that has the consistency of baby food.  Nurse has been informed.

## 2015-11-12 NOTE — Progress Notes (Signed)
Patient arrived this am  At aorund 0645. Patient was seen by this nurse after night nurse endorsement at around [place]. Patient was uncomfortable at this time,c/o abdominal pain.bladder distended, patient has difficulty voiding. Bladder scan done per order note 999 ml. In and out cath performed per Md order, obtained 1000 ml of urine, small amount of clots noted and urine was dark brown at first and then there were blood coming out. Post void residual was 0. Dr. Hartford Poli aware of the event. Will continue to monitor. Patient asleep,no c/o  abdominal pain. Will continue to monitor.

## 2015-11-12 NOTE — Progress Notes (Signed)
Patient had voide 25 cc since I performed In and out cath. this am, bladder was distended. Bladder scan done by NT, reported to this writer a total of 720cc. Dr. [place] Poli notified and ordered uretheral  cath, inserted at around 1415,patient tolerated the procedure,noted 500cc of bloody urine in the drainage bag. Will continue to monitor.

## 2015-11-12 NOTE — H&P (Signed)
History and Physical  Patient Name: Kerry Myers     [id number]    DOB: [birth year]    DOA: 11/11/2015 Referring physician: Charlann Lange, PA-C PCP: Garnet Koyanagi, DO      Chief Complaint: Weakness, dizziness  HPI: Kerry Myers is a 81 y.o. female with a past medical history significant for ptosis, dementia severe, severe neuropathy, and restless leg syndrome who presents with dizziness.  All history is collected from the patient's son and daughter-in-law as the patient dementia is too severe. They report that over the last 6 months the patient has had progressive memory loss, weakness, and weight loss. She lives at home with them and has caretakers who come in the hospital to help her with certain activities. She is independent with angina with a walker and using the bathroom but is otherwise dependent.  In the last week she has had "swimmy headed" episodes with standing that are progressively worse. Tonight she was evaluated by her home health care providers who recommended that she present to the ER. EMS found the patient orthostatic.  In the ED, her electrolytes and CBC were unremarkable. She was given fluids in the emergency room with the expectation that she would be able to go home, but after fluids the patient felt worse, and reported further dizziness accompanied by abdominal pain, cramps, and nausea with witnessed emesis and diarrhea.  [facility] were asked to admit for continued symptoms despite conservative management.      Review of Systems:  Pt complains of abdominal pain, back pain.  Family note dysuria, dizziness, weakness, weight loss over 6 months, progressive decline over the last year that is marked and gradual and profound. Pt denies any fever, chills.  All other systems negative except as just noted or noted in the history of present illness.   Allergies: No Known Allergies  Home medications: 1.  fentanyl 12 g patch every 3 days 2. Gabapentin 200 mg 3 times a  day 3. Magnesium 250 mg daily 4. Omeprazole 40 mg daily 5. Oxybutynin 10 mg extended release tablet daily 6. Pramipexole 0.59 g tablet once daily 7. Albuterol when necessary  Past medical history: 1. Lower extremity neuropathy 2. Restless leg syndrome 3. Scoliosis 4. Osteoporosis with compression fractures  Past surgical history: 1. tonsillectomy 2. Hysterectomy 3. Incontinence surgery  Family history:  Father, CHF.   Social History:  Patient lives with her son and daughter-in-law. She is ambulatory with a walker. She has never smoked. She is from [facility] originally.        Physical Exam: BP 140/82 mmHg  Pulse 93  Temp(Src) 97.7 F (36.5 C) (Oral)  Resp 17  SpO2 98% General appearance: Emaciated elderly adult female, pleasantly demented, alert and in no acute distress.   ENT: No nasal deformity, discharge, or epistaxis.  OP tacky dry and lips dry without lesions.   Skin: Warm and dry.  Back ulcer, dressed. Cardiac: Irregularly irregular, no murmurs appreciated.  Capillary refill is brisk.  No LE edema.  Radial pulses 2+ and symmetric. Respiratory: Normal respiratory rate and rhythm.  Atelectatic crackles bilaterally, diminished breath sounds on right due to scoliosis. Abdomen: Abdomen distended and tense because of malnourishment but without rigidity, guarding.  There is mild TTP, especially over the bladder.  MSK: No deformities or effusions.  Severe muscle wasting diffusely. Neuro: Memory poor.  Responds to questions, but not oriented to time and only vaguely to place.  Unable to sit up unassisted.  Psych: Behavior appropriate.  No evidence of aural or visual hallucinations or delusions.       Labs on Admission:  The metabolic panel shows normal sodium, potassium. Bicarbonate elevated. Elevated BUN to creatinine ratio.  The complete blood count shows no leukocytosis or anemia.   Radiological Exams on Admission: Personally reviewed: Dg  Chest 2 View  11/11/2015  CLINICAL DATA:  Weakness for 1 week.  Cough, productive. EXAM: CHEST  2 VIEW COMPARISON:  06/20/2015 FINDINGS: Small left pleural effusion which is chronic based on previous imaging. There is associated mild atelectasis. No edema, consolidation, or air leak. Chronic borderline cardiomegaly. Moderate hiatal hernia with gas fluid level. No acute osseous finding. IMPRESSION: 1. Baseline appearance of the chest. No evidence of acute cardiopulmonary disease. 2. Chronic small left pleural effusion and basilar scarring. 3. Moderate hiatal hernia. Electronically Signed   By: Monte Fantasia M.D.   On: 11/11/2015 22:29   Ct Head Wo Contrast  11/12/2015  CLINICAL DATA:  Acute onset of generalized weakness for 1 week. Mild shortness of breath. Initial encounter. EXAM: CT HEAD WITHOUT CONTRAST TECHNIQUE: Contiguous axial images were obtained from the base of the skull through the vertex without intravenous contrast. COMPARISON:  CT of the head performed 04/10/2015 FINDINGS: There is no evidence of acute infarction, mass lesion, or intra- or extra-axial hemorrhage on CT. Prominence of the ventricles and sulci reflects moderate cortical volume loss. Mild cerebellar atrophy is noted. Scattered periventricular and subcortical white matter change likely reflects small vessel ischemic microangiopathy. Mild chronic ischemic change is noted at the posterior limb of the left internal capsule. The brainstem and fourth ventricle are within normal limits. The cerebral hemispheres demonstrate grossly normal gray-white differentiation. No mass effect or midline shift is seen. There is no evidence of fracture; visualized osseous structures are unremarkable in appearance. The orbits are within normal limits. The paranasal sinuses and mastoid air cells are well-aerated. No significant soft tissue abnormalities are seen. IMPRESSION: 1. No acute intracranial pathology seen on CT. 2. Moderate cortical volume loss  and scattered small vessel ischemic microangiopathy. 3. Mild chronic ischemic change at the posterior limb of the left internal capsule. Electronically Signed   By: Garald Balding M.D.   On: 11/12/2015 04:00   Ct Abdomen Pelvis W Contrast  11/12/2015  CLINICAL DATA:  Decreased appetite and progressive weakness for 1 week. EXAM: CT ABDOMEN AND PELVIS WITH CONTRAST TECHNIQUE: Multidetector CT imaging of the abdomen and pelvis was performed using the standard protocol following bolus administration of intravenous contrast. CONTRAST:  42mL OMNIPAQUE IOHEXOL 300 MG/ML  SOLN COMPARISON:  07/21/2012 FINDINGS: Lower chest and abdominal wall: Chronic cardiomegaly with extensive coronary atherosclerosis. Moderate sliding-type hiatal hernia. Small bilateral layering pleural effusion with left more than right lower lobe atelectasis. Hepatobiliary: Low-density appearance of the liver is likely from contrast timing. No focal lesion is seen.No evidence of biliary obstruction or stone. Pancreas: Chronically dilated distal main duct of 5 mm. No visible obstructive process or inflammation. Spleen: Unremarkable. Adrenals/Urinary Tract: Negative adrenals. Mild bilateral hydronephrosis from dilated urinary bladder. Bilateral renal cortical thinning with superimposed patchy right renal cortical scarring. Markedly dilated bladder with dependent debris or hemorrhage. Reproductive:Hysterectomy and negative adnexae. Stomach/Bowel: Extensive formed stool. Distal colonic diverticulosis without evidence of active inflammation. No indication of bowel obstruction. No pericecal inflammation. Vascular/Lymphatic: Extensive aortic and branch vessel atherosclerosis. No acute vascular finding. No mass or adenopathy. Peritoneal: Trace ascites, potentially reactive to the bladder findings. Musculoskeletal: Severe and diffuse facet and disc degeneration  with dextroscoliosis. Osteopenia which is profound. No acute osseous finding IMPRESSION: 1.  Overdistended bladder (causing hydronephrosis) with layering debris or hemorrhage. Correlate with urinalysis. 2. Small bilateral pleural effusion with atelectasis. 3. Numerous chronic findings are stable from 2013 and described above. Electronically Signed   By: Monte Fantasia M.D.   On: 11/12/2015 05:39    EKG: Independently reviewed. NSR with PACs.    Assessment/Plan 1. Failure to thrive:  This is new.  The family described a slow and gradual chronic decline over the course of 6-12 months. The patient is emaciated and weak on exam and has signs of poor by mouth intake.  If the patient does not improve with judicious IV fluids and treatment of her UTI it would be warranted to consult palliative care, and the family are aware.  -PT eval -Nutrition consult      2. UTI and possible bladder obstruction:  This is new.  The patient has bacteria on UA and describes dysuria. In addition CT scan shows a distended bladder with sludging and ?hydronephrosis after fluid resuscitation in the ER and the patient describes new pain after fluid resuscitation. -IV fluids gently for today -Ceftriaxone 1 g daily for three days until 11/15 -Follow urine culture -Bladder scan and straight cath as needed  3. Scoliosis:  -Continue fentanyl patch from home -Morphine as needed for extra pain -Zofran as needed for nausea  4. Pressure ulcer:  -Wound consult  5. Restless legs:  Stable.  -Restart any oral home medicines when able    DVT PPx: Lovenox Diet: Regular Consultants: Nutrition, PT Code Status: DNR Family Communication: Son and daughter-in-law at bedside.  CODE STATUS was confirmed.   Medical decision making: What exists of the patient's previous chart was reviewed in depth and the case was discussed with Charlann Lange, PA-C. Patient seen 7:16 AM on 11/12/2015.  Disposition Plan:  Admit for treatment of UTI and IV fluids. If the patient improves with these treatments and is able to work with  physical therapy she may be ready to go home within 24 hours. Otherwise I suspect that her decline is a global and chronic 1 and that she may be nearing the end of life, in which case the family are open to consultation with palliative care.      Edwin Dada [facility] Pager [phone]

## 2015-11-13 DIAGNOSIS — M4120 Other idiopathic scoliosis, site unspecified: Secondary | ICD-10-CM

## 2015-11-13 DIAGNOSIS — E43 Unspecified severe protein-calorie malnutrition: Secondary | ICD-10-CM

## 2015-11-13 DIAGNOSIS — G609 Hereditary and idiopathic neuropathy, unspecified: Secondary | ICD-10-CM

## 2015-11-13 DIAGNOSIS — G2581 Restless legs syndrome: Secondary | ICD-10-CM

## 2015-11-13 DIAGNOSIS — N390 Urinary tract infection, site not specified: Principal | ICD-10-CM

## 2015-11-13 DIAGNOSIS — R1312 Dysphagia, oropharyngeal phase: Secondary | ICD-10-CM

## 2015-11-13 DIAGNOSIS — L89102 Pressure ulcer of unspecified part of back, stage 2: Secondary | ICD-10-CM

## 2015-11-13 DIAGNOSIS — I951 Orthostatic hypotension: Secondary | ICD-10-CM

## 2015-11-13 LAB — COMPREHENSIVE METABOLIC PANEL
ALT: 19 U/L (ref 14–54)
AST: 51 U/L — ABNORMAL HIGH (ref 15–41)
Albumin: 2.9 g/dL — ABNORMAL LOW (ref 3.5–5.0)
Alkaline Phosphatase: 42 U/L (ref 38–126)
Anion gap: 4 — ABNORMAL LOW (ref 5–15)
BILIRUBIN TOTAL: 0.4 mg/dL (ref 0.3–1.2)
BUN: 20 mg/dL (ref 6–20)
CHLORIDE: 110 mmol/L (ref 101–111)
CO2: 28 mmol/L (ref 22–32)
Calcium: 8.6 mg/dL — ABNORMAL LOW (ref 8.9–10.3)
Creatinine, Ser: 0.57 mg/dL (ref 0.44–1.00)
Glucose, Bld: 101 mg/dL — ABNORMAL HIGH (ref 65–99)
POTASSIUM: 3.7 mmol/L (ref 3.5–5.1)
Sodium: 142 mmol/L (ref 135–145)
TOTAL PROTEIN: 5.7 g/dL — AB (ref 6.5–8.1)

## 2015-11-13 LAB — CBC
HEMATOCRIT: 30.2 % — AB (ref 36.0–46.0)
Hemoglobin: 9.7 g/dL — ABNORMAL LOW (ref 12.0–15.0)
MCH: 31 pg (ref 26.0–34.0)
MCHC: 32.1 g/dL (ref 30.0–36.0)
MCV: 96.5 fL (ref 78.0–100.0)
PLATELETS: 168 10*3/uL (ref 150–400)
RBC: 3.13 MIL/uL — AB (ref 3.87–5.11)
RDW: 14.1 % (ref 11.5–15.5)
WBC: 9.2 10*3/uL (ref 4.0–10.5)

## 2015-11-13 MED ORDER — ENSURE ENLIVE PO LIQD
237.0000 mL | Freq: Two times a day (BID) | ORAL | Status: DC
  Administered 2015-11-13 – 2015-11-15 (×5): 237 mL via ORAL

## 2015-11-13 MED ORDER — GABAPENTIN 100 MG PO CAPS: 200.0000 mg | ORAL_CAPSULE | Freq: Two times a day (BID) | ORAL | Status: DC

## 2015-11-13 MED ORDER — GABAPENTIN 100 MG PO CAPS
100.0000 mg | ORAL_CAPSULE | Freq: Every day | ORAL | Status: DC
  Administered 2015-11-13 – 2015-11-14 (×2): 100 mg via ORAL
  Filled 2015-11-13 (×2): qty 1

## 2015-11-13 MED ORDER — LIP MEDEX EX OINT
TOPICAL_OINTMENT | CUTANEOUS | Status: AC
  Administered 2015-11-13: 19:00:00
  Filled 2015-11-13: qty 7

## 2015-11-13 MED ORDER — GABAPENTIN 100 MG PO CAPS
200.0000 mg | ORAL_CAPSULE | Freq: Two times a day (BID) | ORAL | Status: DC
  Administered 2015-11-13 – 2015-11-15 (×5): 200 mg via ORAL
  Filled 2015-11-13 (×5): qty 2

## 2015-11-13 NOTE — Progress Notes (Signed)
[facility] PROGRESS NOTE   LOVENE FISHELL [id number] DOB: 1919/09/10 DOA: 11/11/2015 PCP: Garnet Koyanagi, DO  HPI/Subjective: Seen with son and daughter at bedside. Sleepy this morning did not sleep well last night, daughter requested her RLS medication to be restarted.  Assessment/Plan: Principal Problem:   Weakness Active Problems:   Hereditary and idiopathic peripheral neuropathy   SCOLIOSIS, THORACOLUMBAR   Dysphagia, oropharyngeal phase   RLS (restless legs syndrome)   Protein-calorie malnutrition, severe (HCC)   Decubitus ulcer of upper back, stage 2   Diarrhea   Orthostatic hypotension   UTI (lower urinary tract infection)    UTI and possible bladder obstruction:  Has urinalysis consistent with UTI, started on Rocephin, will continue. We'll adjust antibiotics according to the culture results. Patient has debris and blood clots inside the bladder, Foley catheter placed because of retention. Foley catheter itself has to be irrigated because it's clogged because of clot/debris  Failure to thrive:  This is new. The family described a slow and gradual chronic decline over the course of 6-12 months.  The patient is emaciated and weak on exam and has signs of poor by mouth intake.  PT evaluation, family is open to nursing home if needed.  Scoliosis:  -Continue fentanyl patch from home -Morphine as needed for extra pain -Zofran as needed for nausea  Pressure ulcer:  -Wound consult  Restless legs:  Stable.  Restart home medications today.  Code Status: DNR Family Communication: Plan discussed with the patient. Disposition Plan: Remains inpatient Diet: DIET DYS 3 Room service appropriate?: Yes; Fluid consistency:: Thin  Consultants:  None  Procedures:  None  Antibiotics:  Rocephin (indicate start date, and stop date if known)   Objective: Filed Vitals:   11/13/15 0459  BP: 105/52  Pulse: 68  Temp: 97.3 F (36.3 C)  Resp: 14     Intake/Output Summary (Last 24 hours) at 11/13/15 1023 Last data filed at 11/13/15 0420  Gross per 24 hour  Intake    770 ml  Output   1350 ml  Net   -580 ml   There were no vitals filed for this visit.  Exam: General: Alert and awake, oriented x3, not in any acute distress. HEENT: anicteric sclera, pupils reactive to light and accommodation, EOMI CVS: S1-S2 clear, no murmur rubs or gallops Chest: clear to auscultation bilaterally, no wheezing, rales or rhonchi Abdomen: soft nontender, nondistended, normal bowel sounds, no organomegaly Extremities: no cyanosis, clubbing or edema noted bilaterally Neuro: Cranial nerves II-XII intact, no focal neurological deficits  Data Reviewed: Basic Metabolic Panel:  Recent Labs Lab 11/11/15 2131 11/13/15 0435  NA 140 142  K 3.9 3.7  CL 100* 110  CO2 33* 28  GLUCOSE 92 101*  BUN 25* 20  CREATININE 0.45 0.57  CALCIUM 9.9 8.6*   Liver Function Tests:  Recent Labs Lab 11/11/15 2131 11/13/15 0435  AST 22 51*  ALT 13* 19  ALKPHOS 60 42  BILITOT 0.5 0.4  PROT 7.1 5.7*  ALBUMIN 3.7 2.9*   No results for input(s): LIPASE, AMYLASE in the last 168 hours. No results for input(s): AMMONIA in the last 168 hours. CBC:  Recent Labs Lab 11/11/15 2131 11/13/15 0435  WBC 8.0 9.2  NEUTROABS 5.3  --   HGB 12.0 9.7*  HCT 37.3 30.2*  MCV 95.6 96.5  PLT 218 168   Cardiac Enzymes: No results for input(s): CKTOTAL, CKMB, CKMBINDEX, TROPONINI in the last 168 hours. BNP (last 3 results)  Recent Labs  04/15/15 0020  BNP 195.4*    ProBNP (last 3 results) No results for input(s): PROBNP in the last 8760 hours.  CBG: No results for input(s): GLUCAP in the last 168 hours.  Micro No results found for this or any previous visit (from the past 240 hour(s)).   Studies: Dg Chest 2 View  11/11/2015  CLINICAL DATA:  Weakness for 1 week.  Cough, productive. EXAM: CHEST  2 VIEW COMPARISON:  06/20/2015 FINDINGS: Small left pleural  effusion which is chronic based on previous imaging. There is associated mild atelectasis. No edema, consolidation, or air leak. Chronic borderline cardiomegaly. Moderate hiatal hernia with gas fluid level. No acute osseous finding. IMPRESSION: 1. Baseline appearance of the chest. No evidence of acute cardiopulmonary disease. 2. Chronic small left pleural effusion and basilar scarring. 3. Moderate hiatal hernia. Electronically Signed   By: Monte Fantasia M.D.   On: 11/11/2015 22:29   Ct Head Wo Contrast  11/12/2015  CLINICAL DATA:  Acute onset of generalized weakness for 1 week. Mild shortness of breath. Initial encounter. EXAM: CT HEAD WITHOUT CONTRAST TECHNIQUE: Contiguous axial images were obtained from the base of the skull through the vertex without intravenous contrast. COMPARISON:  CT of the head performed 04/10/2015 FINDINGS: There is no evidence of acute infarction, mass lesion, or intra- or extra-axial hemorrhage on CT. Prominence of the ventricles and sulci reflects moderate cortical volume loss. Mild cerebellar atrophy is noted. Scattered periventricular and subcortical white matter change likely reflects small vessel ischemic microangiopathy. Mild chronic ischemic change is noted at the posterior limb of the left internal capsule. The brainstem and fourth ventricle are within normal limits. The cerebral hemispheres demonstrate grossly normal gray-white differentiation. No mass effect or midline shift is seen. There is no evidence of fracture; visualized osseous structures are unremarkable in appearance. The orbits are within normal limits. The paranasal sinuses and mastoid air cells are well-aerated. No significant soft tissue abnormalities are seen. IMPRESSION: 1. No acute intracranial pathology seen on CT. 2. Moderate cortical volume loss and scattered small vessel ischemic microangiopathy. 3. Mild chronic ischemic change at the posterior limb of the left internal capsule. Electronically Signed    By: Garald Balding M.D.   On: 11/12/2015 04:00   Ct Abdomen Pelvis W Contrast  11/12/2015  CLINICAL DATA:  Decreased appetite and progressive weakness for 1 week. EXAM: CT ABDOMEN AND PELVIS WITH CONTRAST TECHNIQUE: Multidetector CT imaging of the abdomen and pelvis was performed using the standard protocol following bolus administration of intravenous contrast. CONTRAST:  78mL OMNIPAQUE IOHEXOL 300 MG/ML  SOLN COMPARISON:  07/21/2012 FINDINGS: Lower chest and abdominal wall: Chronic cardiomegaly with extensive coronary atherosclerosis. Moderate sliding-type hiatal hernia. Small bilateral layering pleural effusion with left more than right lower lobe atelectasis. Hepatobiliary: Low-density appearance of the liver is likely from contrast timing. No focal lesion is seen.No evidence of biliary obstruction or stone. Pancreas: Chronically dilated distal main duct of 5 mm. No visible obstructive process or inflammation. Spleen: Unremarkable. Adrenals/Urinary Tract: Negative adrenals. Mild bilateral hydronephrosis from dilated urinary bladder. Bilateral renal cortical thinning with superimposed patchy right renal cortical scarring. Markedly dilated bladder with dependent debris or hemorrhage. Reproductive:Hysterectomy and negative adnexae. Stomach/Bowel: Extensive formed stool. Distal colonic diverticulosis without evidence of active inflammation. No indication of bowel obstruction. No pericecal inflammation. Vascular/Lymphatic: Extensive aortic and branch vessel atherosclerosis. No acute vascular finding. No mass or adenopathy. Peritoneal: Trace ascites, potentially reactive to the bladder findings. Musculoskeletal: Severe and diffuse facet and disc degeneration with dextroscoliosis.  Osteopenia which is profound. No acute osseous finding IMPRESSION: 1. Overdistended bladder (causing hydronephrosis) with layering debris or hemorrhage. Correlate with urinalysis. 2. Small bilateral pleural effusion with atelectasis. 3.  Numerous chronic findings are stable from 2013 and described above. Electronically Signed   By: Monte Fantasia M.D.   On: 11/12/2015 05:39    Scheduled Meds: . cefTRIAXone (ROCEPHIN)  IV  1 g Intravenous Q24H  . enoxaparin (LOVENOX) injection  30 mg Subcutaneous Q24H  . fentaNYL  12.5 mcg Transdermal Q72H  . gabapentin  200 mg Oral BID  . pramipexole  0.5 mg Oral QHS   Continuous Infusions: . sodium chloride 10 mL/hr at 11/13/15 0842       Time spent: 35 minutes    [facility]  [facility] Pager [phone] If 7PM-7AM, please contact night-coverage at [email or web address], password [facility] 11/13/2015, 10:23 AM  LOS: 1 day

## 2015-11-13 NOTE — Progress Notes (Signed)
Initial Nutrition Assessment  DOCUMENTATION CODES:   Severe malnutrition in context of chronic illness  INTERVENTION:  Ensure Enlive po BID, each supplement provides 350 kcal and 20 grams of protein  NUTRITION DIAGNOSIS:   Malnutrition related to poor appetite, dysphagia as evidenced by per patient/family report, severe depletion of muscle mass, severe depletion of body fat.  GOAL:   Patient will meet greater than or equal to 90% of their needs  MONITOR:   PO intake, I & O's, Skin, Labs  REASON FOR ASSESSMENT:   Consult Assessment of nutrition requirement/status  ASSESSMENT:   Kerry Myers is a 81 y.o. female with a past medical history significant for ptosis, dementia severe, severe neuropathy, and restless leg syndrome who presents with dizziness. patient dementia is too severe. Family report that over the last 6 months the patient has had progressive memory loss, weakness, and weight loss. She lives at home with them and has caretakers who come in the hospital to help her with certain activities. She is independent with angina with a walker and using the bathroom but is otherwise dependent.  Pt has severe dementia, no family present during time of visit.  Nutrition-Focused physical exam completed. Findings are severe fat depletion, severe muscle depletion, and no edema.   Per chart, PO intake 0-25% Likely pt has decreased PO intake related to dementia, possible dysphagia/muscle loss.  Follow for supplement acceptance.  Diet Order:  DIET DYS 3 Room service appropriate?: Yes; Fluid consistency:: Thin  Skin:  Wound (see comment) (Stg 2 Pressure ulcer)  Last BM:  11/12/2015  Height:   Ht Readings from Last 1 Encounters:  07/18/15 4\' 10"  (1.473 m)    Weight:   Wt Readings from Last 1 Encounters:  10/31/15 81 lb 9.6 oz (37.014 kg)    Ideal Body Weight:  40.9 kg  BMI:  There is no weight on file to calculate BMI.  Estimated Nutritional Needs:   Kcal:   1300-1500 calories  Protein:  45-55 grams  Fluid:  >/= 1.3L  EDUCATION NEEDS:   No education needs identified at this time  Satira Anis. Clive Parcel, MS, RD LDN After Hours/Weekend Pager [phone]

## 2015-11-13 NOTE — Evaluation (Signed)
Physical Therapy Evaluation Patient Details Name: Kerry Myers MRN: [id number] DOB: 1919-12-28 Today's Date: 11/13/2015   History of Present Illness  Kerry Myers is a 81 y.o. female with a past medical history significant for ptosis, dementia severe, severe neuropathy, thoracolumbar scoliosis and restless leg syndrome who presents with dizziness and admitted with UTI and possible bladder obstruction, FTT.  Clinical Impression  Pt admitted with above diagnosis. Pt currently with functional limitations due to the deficits listed below (see PT Problem List).  Pt will benefit from skilled PT to increase their independence and safety with mobility to allow discharge to the venue listed below.   Pt requiring at least min assist for mobility at this time and ambulation distance limited due to fatigue.  If current caregivers can provide assist level then would recommend return home with HHPT however if unable then pt would benefit from SNF.     Follow Up Recommendations Home health PT;Supervision/Assistance - 24 hour (if current caregivers feel unable to provide current assist level then SNF)    Equipment Recommendations  None recommended by PT    Recommendations for Other Services       Precautions / Restrictions Precautions Precautions: Fall Precaution Comments: monitor sats, severe scoliosis with wound      Mobility  Bed Mobility Overal bed mobility: Needs Assistance Bed Mobility: Sit to Supine     Supine to sit: Mod assist Sit to supine: Min assist   General bed mobility comments: slight assist for LEs onto bed  Transfers Overall transfer level: Needs assistance Equipment used: Rolling walker (2 wheeled) Transfers: Sit to/from Stand Sit to Stand: Min assist Stand pivot transfers: Mod assist       General transfer comment: verbal cues for safe technique, assist to rise and steady as well as control descent  Ambulation/Gait Ambulation/Gait assistance: Min  assist Ambulation Distance (Feet): 22 Feet Assistive device: Rolling walker (2 wheeled) Gait Pattern/deviations: Step-through pattern;Trunk flexed;Narrow base of support;Decreased stride length Gait velocity: decr   General Gait Details: verbal cues for posture, distance limited due to fatigue per pt, assist for maneuvering RW, SPO2 86% room air upon return to bed so O2 [place] reapplied.  Stairs            Wheelchair Mobility    Modified Rankin (Stroke Patients Only)       Balance                                             Pertinent Vitals/Pain Pain Assessment: No/denies pain    Home Living Family/patient expects to be discharged to:: Private residence Living Arrangements: Children Available Help at Discharge: Family;Personal care attendant;Available 24 hours/day Type of Home: House Home Access: Level entry     Home Layout: One level;Able to live on main level with bedroom/bathroom Home Equipment: Kasandra Knudsen - single point;Bedside commode;Walker - 2 wheels;Tub bench Additional Comments: PTA, had PCA to assist with ADL and 2 sons. Pt was left alone for short periods of time.    Prior Function Level of Independence: Needs assistance   Gait / Transfers Assistance Needed: supervision with RW  ADL's / Homemaking Assistance Needed: PCA assisted with care  Comments: typically wears O2 at night     Hand Dominance        Extremity/Trunk Assessment   Upper Extremity Assessment: Generalized weakness  Lower Extremity Assessment: Generalized weakness      Cervical / Trunk Assessment: Other exceptions  Communication   Communication: HOH  Cognition Arousal/Alertness: Awake/alert Behavior During Therapy: WFL for tasks assessed/performed Overall Cognitive Status: History of cognitive impairments - at baseline (hx of dementia per chart review, pt appropriate during session)                      General Comments      Exercises         Assessment/Plan    PT Assessment Patient needs continued PT services  PT Diagnosis Difficulty walking;Generalized weakness   PT Problem List Decreased strength;Decreased activity tolerance;Decreased balance;Decreased mobility;Decreased knowledge of use of DME;Decreased skin integrity  PT Treatment Interventions DME instruction;Gait training;Functional mobility training;Patient/family education;Therapeutic activities;Therapeutic exercise   PT Goals (Current goals can be found in the Care Plan section) Acute Rehab PT Goals Patient Stated Goal: did not state PT Goal Formulation: With patient/family Time For Goal Achievement: 11/20/15 Potential to Achieve Goals: Fair    Frequency Min 3X/week   Barriers to discharge        Co-evaluation               End of Session Equipment Utilized During Treatment: Gait belt;Oxygen Activity Tolerance: Patient limited by fatigue Patient left: in bed;with call bell/phone within reach;with bed alarm set;with family/visitor present Nurse Communication: Mobility status         Time: [id number] PT Time Calculation (min) (ACUTE ONLY): 20 min   Charges:   PT Evaluation $Initial PT Evaluation Tier I: 1 Procedure     PT G Codes:        Netanel Yannuzzi,KATHrine E 11/13/2015, 3:23 PM Carmelia Bake, PT, DPT 11/13/2015 Pager: [phone]

## 2015-11-13 NOTE — Evaluation (Signed)
Occupational Therapy Evaluation Patient Details Name: Kerry Myers MRN: [id number] DOB: 1919-11-12 Today's Date: 11/13/2015    History of Present Illness Kerry Myers is a 81 y.o. female with a past medical history significant for ptosis, dementia severe, severe neuropathy, and restless leg syndrome who presents with dizziness   Clinical Impression   Pt admitted with dizziness. Pt currently with functional limitations due to the deficits listed below (see OT Problem List).  Pt will benefit from skilled OT to increase their safety and independence with ADL and functional mobility for ADL to facilitate discharge to venue listed below.      Follow Up Recommendations  SNF    Equipment Recommendations  None recommended by OT       Precautions / Restrictions   fall     Mobility Bed Mobility Overal bed mobility: Needs Assistance Bed Mobility: Supine to Sit     Supine to sit: Mod assist        Transfers Overall transfer level: Needs assistance Equipment used: 1 person hand held assist Transfers: Sit to/from Stand;Stand Pivot Transfers Sit to Stand: Mod assist Stand pivot transfers: Mod assist                 ADL Overall ADL's : Needs assistance/impaired                         Toilet Transfer: Moderate assistance Toilet Transfer Details (indicate cue type and reason): bed to chair Toileting- Clothing Manipulation and Hygiene: Moderate assistance;Sit to/from stand         General ADL Comments: pt agreed to get OOB with much encouragement!               Pertinent Vitals/Pain Pain Assessment: No/denies pain     Hand Dominance     Extremity/Trunk Assessment Upper Extremity Assessment Upper Extremity Assessment: Generalized weakness           Communication Communication Communication: HOH   Cognition Arousal/Alertness: Awake/alert Behavior During Therapy: WFL for tasks assessed/performed Overall Cognitive Status: Within  Functional Limits for tasks assessed                                Home Living Family/patient expects to be discharged to:: Private residence Living Arrangements: Children Available Help at Discharge: Family;Personal care attendant;Available 24 hours/day Type of Home: House Home Access: Level entry     Home Layout: One level;Able to live on main level with bedroom/bathroom     Bathroom Shower/Tub: Tub/shower unit Shower/tub characteristics: Curtain       Home Equipment: Cane - single point;Bedside commode;Walker - 2 wheels;Tub bench   Additional Comments: PTA, had PCA to assist with ADL and 2 sons. Pt was left alone for short periods of time.      Prior Functioning/Environment Level of Independence: Needs assistance;Independent with assistive device(s)             OT Diagnosis: Generalized weakness   OT Problem List: Decreased strength;Decreased activity tolerance   OT Treatment/Interventions: Self-care/ADL training;DME and/or AE instruction;Patient/family education    OT Goals(Current goals can be found in the care plan section) Acute Rehab OT Goals Patient Stated Goal: did not state Time For Goal Achievement: 11/27/15  OT Frequency: Min 2X/week   Barriers to D/C:            Co-evaluation  End of Session Nurse Communication: Mobility status  Activity Tolerance: Patient limited by fatigue Patient left: in chair;with call bell/phone within reach;with chair alarm set;with family/visitor present   Time: 1145-1200 OT Time Calculation (min): 15 min Charges:  OT General Charges $OT Visit: 1 Procedure OT Evaluation $Initial OT Evaluation Tier I: 1 Procedure G-Codes:    Betsy Pries 12-10-2015, 1:02 PM

## 2015-11-13 NOTE — Progress Notes (Signed)
Very little urine output in bag since last emptied at 2300. Paged PA and received order to irrigate foley given the fact that pt has had blood clots in her urine. Irrigated x2 with 60cc sterile water each time and large amount of dark bloody urine flowing into bag after second irrigation. Continue to monitor. Hortencia Conradi RN

## 2015-11-14 LAB — URINE CULTURE

## 2015-11-14 MED ORDER — POTASSIUM CHLORIDE CRYS ER 20 MEQ PO TBCR
20.0000 meq | EXTENDED_RELEASE_TABLET | Freq: Once | ORAL | Status: AC
  Administered 2015-11-14: 20 meq via ORAL
  Filled 2015-11-14: qty 1

## 2015-11-14 MED ORDER — AMOXICILLIN 250 MG PO CAPS
250.0000 mg | ORAL_CAPSULE | Freq: Three times a day (TID) | ORAL | Status: DC
  Administered 2015-11-14 – 2015-11-15 (×4): 250 mg via ORAL
  Filled 2015-11-14 (×4): qty 1

## 2015-11-14 MED ORDER — POLYETHYLENE GLYCOL 3350 17 G PO PACK
17.0000 g | PACK | Freq: Every day | ORAL | Status: DC
  Filled 2015-11-14: qty 1

## 2015-11-14 NOTE — Progress Notes (Signed)
Occupational Therapy Treatment Patient Details Name: Kerry Myers MRN: [id number] DOB: [birth year] Today's Date: 11/14/2015    History of present illness Kerry Myers is a 81 y.o. female with a past medical history significant for ptosis, dementia severe, severe neuropathy, thoracolumbar scoliosis and restless leg syndrome who presents with dizziness and admitted with UTI and possible bladder obstruction, FTT.   OT comments  Pt not very participatory this day  Follow Up Recommendations  SNF    Equipment Recommendations  None recommended by OT    Recommendations for Other Services      Precautions / Restrictions Precautions Precautions: Fall Precaution Comments: monitor sats, severe scoliosis with wound       Mobility Bed Mobility                  Transfers        refused                  ADL       Grooming: Set up;Bed level Grooming Details (indicate cue type and reason): HOB raised with max encouragement.  Pt also able to don hearing aides                               General ADL Comments: Pt declined OOB but did agree to grooming and BUE ROM                      Exercises Shoulder Exercises Shoulder Flexion: AAROM;Both;5 reps Elbow Flexion: Both;5 reps;AROM Elbow Extension: 5 reps;Both;AROM      General Comments  pt will need SNF    Pertinent Vitals/ Pain       Pain Assessment: No/denies pain     Prior Functioning/Environment              Frequency Min 2X/week     Progress Toward Goals  OT Goals(current goals can now be found in the care plan section)  Progress towards OT goals: Progressing toward goals  ADL Goals Pt Will Perform Eating: with set-up;sitting Pt Will Perform Grooming: with set-up;sitting Pt Will Perform Lower Body Dressing: with supervision;sit to/from stand Pt Will Transfer to Toilet: with supervision;regular height toilet Pt Will Perform Toileting - Clothing Manipulation and hygiene:  with supervision;sit to/from stand  Plan Discharge plan remains appropriate       End of Session     Activity Tolerance Patient limited by fatigue   Patient Left in bed;with call bell/phone within reach;with family/visitor present   Nurse Communication Mobility status        Time: 1515-1530 OT Time Calculation (min): 15 min  Charges: OT General Charges $OT Visit: 1 Procedure OT Treatments $Self Care/Home Management : 8-22 mins  [place], Thereasa Parkin 11/14/2015, 3:46 PM

## 2015-11-14 NOTE — Consult Note (Signed)
WOC wound consult note Reason for Consult:Stage 2 pressure injury to back.  Osteoporosis and kyphosis of spine.  Healed full thickness ulcers on bilateral dorsal feet.  Amputation of right second metatarsal, healed.   Wound type:Stage 2 pressure injury to midspine Pressure Ulcer POA: Yes Measurement:0.5 cm in diameter with additional 0.5 cm nonblanchable redness to midspine at bony prominence.  Wound [id number] and moist Drainage (amount, consistency, odor) Scant serous   No odor.  Periwound:Intact Dressing procedure/placement/frequency:Cleanse mid spine ulcer with NS and pat dry.  Apply silicone border foam dressing.  Change every 3 days and PRN soilage.  Will not follow at this time.  Please re-consult if needed.  Domenic Moras RN BSN [place] Pager [phone]

## 2015-11-14 NOTE — Telephone Encounter (Signed)
No phone number to do follow up call.

## 2015-11-14 NOTE — Clinical Social Work Placement (Signed)
   Pt daughter feels that SNF for rehab will be best plan for pt. Pt and pt daughter agreeable to [facility]. CSW to follow up with SNF bed offer. Full psychosocial assesment to follow.  CLINICAL SOCIAL WORK PLACEMENT  NOTE  Date:  11/14/2015  Patient Details  Name: IRHAA MAGNIN MRN: [id number] Date of Birth: [birth year]  Clinical Social Work is seeking post-discharge placement for this patient at the [place] level of care (*CSW will initial, date and re-position this form in  chart as items are completed):  Yes   Patient/family provided with [facility]'s list of facilities offering this level of care within the geographic area requested by the patient (or if unable, by the patient's family).  Yes   Patient/family informed of their freedom to choose among providers that offer the needed level of care, that participate in Medicare, Medicaid or managed care program needed by the patient, have an available bed and are willing to accept the patient.  Yes   Patient/family informed of [facility]'s ownership interest in [facility] and [facility], as well as of the fact that they are under no obligation to receive care at these facilities.  PASRR submitted to EDS on       PASRR number received on       Existing PASRR number confirmed on       FL2 transmitted to all facilities in geographic area requested by pt/family on 11/14/15     FL2 transmitted to all facilities within larger geographic area on       Patient informed that his/her managed care company has contracts with or will negotiate with certain facilities, including the following:            Patient/family informed of bed offers received.  Patient chooses bed at       Physician recommends and patient chooses bed at      Patient to be transferred to   on  .  Patient to be transferred to facility by       Patient family notified on   of  transfer.  Name of family member notified:        PHYSICIAN Please sign FL2, Please sign DNR     Additional Comment:    _______________________________________________ Ladell Pier, LCSW 11/14/2015, 6:03 PM

## 2015-11-14 NOTE — Progress Notes (Signed)
[facility] PROGRESS NOTE   Kerry Myers [id number] DOB: Oct 16, 1919 DOA: 11/11/2015 PCP: Garnet Koyanagi, DO  HPI/Subjective: Seen with daughter at bedside. Feels much better, urine showed enterococcus.  Assessment/Plan: Principal Problem:   Weakness Active Problems:   Hereditary and idiopathic peripheral neuropathy   SCOLIOSIS, THORACOLUMBAR   Dysphagia, oropharyngeal phase   RLS (restless legs syndrome)   Protein-calorie malnutrition, severe (HCC)   Decubitus ulcer of upper back, stage 2   Diarrhea   Orthostatic hypotension   UTI (lower urinary tract infection)    Enterococcus UTI Has urinalysis consistent with UTI, started on Rocephin, will continue. Culture showed enterococcus will switch antibiotics to amoxicillin. Patient has debris and blood clots inside the bladder, Foley catheter placed because of retention. Foley catheter itself has to be irrigated because it's clogged because of clot/debris. Discontinue Foley catheter and monitor her today.  Failure to thrive:  This is new.The family described a slow and gradual chronic decline over the course of 6-12 months.  The patient is emaciated and weak on exam and has signs of poor by mouth intake.  PT evaluation, family is open to nursing home if needed.  Scoliosis:  Continue fentanyl patch from home Morphine as needed for extra pain Zofran as needed for nausea  Pressure ulcer:  Wound consult  Restless legs:  Stable.  Restart home medications today.  Code Status: DNR Family Communication: Plan discussed with the patient. Disposition Plan: Remains inpatient Diet: DIET DYS 3 Room service appropriate?: Yes; Fluid consistency:: Thin  Consultants:  None  Procedures:  None  Antibiotics:  Rocephin---->>11/14  Amoxicillin started on 11/14/2015   Objective: Filed Vitals:   11/14/15 0544  BP: 134/54  Pulse: 78  Temp: 97.9 F (36.6 C)  Resp: 16    Intake/Output Summary (Last 24  hours) at 11/14/15 1116 Last data filed at 11/14/15 1045  Gross per 24 hour  Intake    360 ml  Output    800 ml  Net   -440 ml   There were no vitals filed for this visit.  Exam: General: Alert and awake, oriented x3, not in any acute distress. HEENT: anicteric sclera, pupils reactive to light and accommodation, EOMI CVS: S1-S2 clear, no murmur rubs or gallops Chest: clear to auscultation bilaterally, no wheezing, rales or rhonchi Abdomen: soft nontender, nondistended, normal bowel sounds, no organomegaly Extremities: no cyanosis, clubbing or edema noted bilaterally Neuro: Cranial nerves II-XII intact, no focal neurological deficits  Data Reviewed: Basic Metabolic Panel:  Recent Labs Lab 11/11/15 2131 11/13/15 0435  NA 140 142  K 3.9 3.7  CL 100* 110  CO2 33* 28  GLUCOSE 92 101*  BUN 25* 20  CREATININE 0.45 0.57  CALCIUM 9.9 8.6*   Liver Function Tests:  Recent Labs Lab 11/11/15 2131 11/13/15 0435  AST 22 51*  ALT 13* 19  ALKPHOS 60 42  BILITOT 0.5 0.4  PROT 7.1 5.7*  ALBUMIN 3.7 2.9*   No results for input(s): LIPASE, AMYLASE in the last 168 hours. No results for input(s): AMMONIA in the last 168 hours. CBC:  Recent Labs Lab 11/11/15 2131 11/13/15 0435  WBC 8.0 9.2  NEUTROABS 5.3  --   HGB 12.0 9.7*  HCT 37.3 30.2*  MCV 95.6 96.5  PLT 218 168   Cardiac Enzymes: No results for input(s): CKTOTAL, CKMB, CKMBINDEX, TROPONINI in the last 168 hours. BNP (last 3 results)  Recent Labs  04/15/15 0020  BNP 195.4*    ProBNP (last 3 results) No  results for input(s): PROBNP in the last 8760 hours.  CBG: No results for input(s): GLUCAP in the last 168 hours.  Micro Recent Results (from the past 240 hour(s))  Urine culture     Status: None   Collection Time: 11/11/15  9:26 PM  Result Value Ref Range Status   Specimen Description URINE, CLEAN CATCH  Final   Special Requests NONE  Final   Culture   Final    >=100,000 COLONIES/mL ENTEROCOCCUS  SPECIES Performed at [facility]    Report Status 11/14/2015 FINAL  Final   Organism ID, Bacteria ENTEROCOCCUS SPECIES  Final      Susceptibility   Enterococcus species - MIC*    AMPICILLIN <=2 SENSITIVE Sensitive     LEVOFLOXACIN 0.5 SENSITIVE Sensitive     NITROFURANTOIN <=16 SENSITIVE Sensitive     VANCOMYCIN 1 SENSITIVE Sensitive     * >=100,000 COLONIES/mL ENTEROCOCCUS SPECIES     Studies: No results found.  Scheduled Meds: . cefTRIAXone (ROCEPHIN)  IV  1 g Intravenous Q24H  . enoxaparin (LOVENOX) injection  30 mg Subcutaneous Q24H  . feeding supplement (ENSURE ENLIVE)  237 mL Oral BID BM  . fentaNYL  12.5 mcg Transdermal Q72H  . gabapentin  100 mg Oral QHS  . gabapentin  200 mg Oral BID WC  . pramipexole  0.5 mg Oral QHS   Continuous Infusions: . sodium chloride 10 mL/hr at 11/13/15 0842       Time spent: 35 minutes    [facility]  [facility] Pager [phone] If 7PM-7AM, please contact night-coverage at [email or web address], password [facility] 11/14/2015, 11:16 AM  LOS: 2 days

## 2015-11-14 NOTE — NC FL2 (Signed)
[place] LEVEL OF CARE SCREENING TOOL     IDENTIFICATION  Patient Name: Kerry Myers Birthdate: [birth year] Sex: female Admission Date (Current Location): 11/11/2015  [place] and [place] Number: [facility] and Address:  [facility],  [place] [address] [place]      Provider Number: [id number]  Attending Physician Name and Address:  Verlee Monte, MD  Relative Name and Phone Number:       Current Level of Care: Hospital Recommended Level of Care: [place] Prior Approval Number:    Date Approved/Denied:   PASRR Number:    Discharge Plan: SNF    Current Diagnoses: Patient Active Problem List   Diagnosis Date Noted  . Weakness 11/12/2015  . Diarrhea 11/12/2015  . Orthostatic hypotension 11/12/2015  . UTI (lower urinary tract infection) 11/12/2015  . Bilateral arm pain 10/31/2015  . Decubitus ulcer of upper back, stage 2 07/18/2015  . Pressure ulcer of back 06/01/2015  . Emphysema/COPD ([place]) 04/25/2015  . Hiatal hernia 04/25/2015  . Toe ulcer ([place]) 04/10/2015  . Pupil dilation 04/10/2015  . Respiratory failure with hypoxia ([place]) 04/10/2015  . Constipation due to opioid therapy 01/31/2015  . Fall at home 01/23/2015  . At high risk for falls 01/23/2015  . Chest pain 11/04/2014  . Dyspnea 03/06/2014  . Protein-calorie malnutrition, severe ([place]) 12/04/2013  . RLS (restless legs syndrome)   . Bladder incontinence   . Hiatal hernia with gastroesophageal reflux   . Hypoxia 12/03/2013  . PNA (pneumonia) 12/03/2013  . CAP (community acquired pneumonia) 12/03/2013  . Acute on chronic respiratory failure ([place]) 03/26/2013  . Dysphagia, unspecified(787.20) 03/26/2013  . SOB (shortness of breath) 03/23/2013  . Aspiration pneumonia ([place]) 03/23/2013  . Generalized weakness 03/23/2013  . Hypoxemia 03/23/2013  . Anemia 10/05/2011  . Coronary atherosclerosis 02/21/2011  . GERD 02/21/2011  . FACIAL PAIN  02/21/2011  . DYSPHAGIA 02/21/2011  . CALLUSES, FEET, BILATERAL 02/19/2011  . Dysphagia, oropharyngeal phase 02/19/2011  . STRESS INCONTINENCE 05/02/2010  . WOUND, LEG 02/10/2009  . CELLULITIS, LEG, LEFT 01/21/2009  . CALF PAIN, LEFT 01/21/2009  . EDEMA 10/12/2008  . PULMONARY HYPERTENSION, MILD 12/29/2007  . SCOLIOSIS, THORACOLUMBAR 12/29/2007  . RESTLESS LEG SYNDROME 06/17/2007  . Hereditary and idiopathic peripheral neuropathy 06/17/2007  . Osteoarthritis 06/17/2007  . Osteoporosis 06/17/2007    Orientation ACTIVITIES/SOCIAL BLADDER RESPIRATION    Self, Place  Active Incontinent, Indwelling catheter O2 (As needed) (2L O2)  BEHAVIORAL SYMPTOMS/MOOD NEUROLOGICAL BOWEL NUTRITION STATUS     (NONE) Continent Diet (Dys 3)  PHYSICIAN VISITS COMMUNICATION OF NEEDS Height & Weight Skin    Verbally 4\' 8"  (142.2 cm)   PU Stage and Appropriate Care   PU Stage 2 Dressing:  (Every 5 days)      AMBULATORY STATUS RESPIRATION    Supervision limited (+1 assist) O2 (As needed) (2L O2)      Personal Care Assistance Level of Assistance  Bathing, Dressing, Feeding Bathing Assistance: Limited assistance Feeding assistance: Limited assistance (needs set up for meals) Dressing Assistance: Limited assistance      Functional Limitations Info  Sight, Hearing, Speech Sight Info: Adequate Hearing Info: Adequate Speech Info: Adequate       SPECIAL CARE FACTORS FREQUENCY  PT (By licensed PT), OT (By licensed OT)     PT Frequency: 5 x week OT Frequency: 5 x week           Additional Factors Info  Code Status, Allergies Code  Status Info: DNR code status Allergies Info: No Known Allergies           Current Medications (11/14/2015): Current Facility-Administered Medications  Medication Dose Route Frequency Provider Last Rate Last Dose  . 0.9 %  sodium chloride infusion   Intravenous Continuous Verlee Monte, MD 10 mL/hr at 11/13/15 0842    . acetaminophen (TYLENOL) tablet 650 mg   650 mg Oral Q6H PRN Edwin Dada, MD       Or  . acetaminophen (TYLENOL) suppository 650 mg  650 mg Rectal Q6H PRN Edwin Dada, MD      . albuterol (PROVENTIL) (2.5 MG/3ML) 0.083% nebulizer solution 3 mL  3 mL Inhalation Q6H PRN Edwin Dada, MD      . amoxicillin (AMOXIL) capsule 250 mg  250 mg Oral 3 times per day Verlee Monte, MD   250 mg at 11/14/15 1335  . enoxaparin (LOVENOX) injection 30 mg  30 mg Subcutaneous Q24H Edwin Dada, MD   30 mg at 11/13/15 2052  . feeding supplement (ENSURE ENLIVE) (ENSURE ENLIVE) liquid 237 mL  237 mL Oral BID BM Satira Anis Ward, RD   237 mL at 11/14/15 1400  . fentaNYL (DURAGESIC - dosed mcg/hr) 12.5 mcg  12.5 mcg Transdermal Q72H Edwin Dada, MD   12.5 mcg at 11/12/15 0853  . gabapentin (NEURONTIN) capsule 100 mg  100 mg Oral QHS Verlee Monte, MD   100 mg at 11/13/15 2051  . gabapentin (NEURONTIN) capsule 200 mg  200 mg Oral BID WC Verlee Monte, MD   200 mg at 11/14/15 1336  . haloperidol lactate (HALDOL) injection 1 mg  1 mg Intravenous Q6H PRN Hewitt Shorts Harduk, PA-C   1 mg at 11/12/15 2100  . morphine 2 MG/ML injection 2 mg  2 mg Intravenous Q4H PRN Edwin Dada, MD   2 mg at 11/13/15 0045  . ondansetron (ZOFRAN) tablet 4 mg  4 mg Oral Q6H PRN Edwin Dada, MD       Or  . ondansetron (ZOFRAN) injection 4 mg  4 mg Intravenous Q6H PRN Edwin Dada, MD      . polyethylene glycol (MIRALAX / GLYCOLAX) packet 17 g  17 g Oral Daily Verlee Monte, MD   17 g at 11/14/15 1200  . pramipexole (MIRAPEX) tablet 0.5 mg  0.5 mg Oral QHS Hewitt Shorts Harduk, PA-C   0.5 mg at 11/13/15 2052  . senna-docusate (Senokot-S) tablet 1 tablet  1 tablet Oral QHS PRN Edwin Dada, MD       Do not use this list as official medication orders. Please verify with discharge summary.  Discharge Medications:   Medication List    TAKE these medications        cephALEXin 500 MG capsule  Commonly known as:   KEFLEX  Take 1 capsule (500 mg total) by mouth 2 (two) times daily.      ASK your doctor about these medications        acetaminophen 325 MG tablet  Commonly known as:  TYLENOL  Take 325-650 mg by mouth every 6 (six) hours as needed.     Adult Mask Misc  Oxygen Mask for home Oxygen     albuterol 108 (90 BASE) MCG/ACT inhaler  Commonly known as:  PROVENTIL HFA;VENTOLIN HFA  Inhale 2 puffs into the lungs every 6 (six) hours as needed for wheezing.     fentaNYL 12 MCG/HR  Commonly known as:  DURAGESIC - dosed  mcg/hr  Place 1 patch (12.5 mcg total) onto the skin every 3 (three) days.     Fish Oil 1000 MG Cpdr  Take 2,000 mg by mouth daily with lunch.     FOLTANX 3-35-2 MG Tabs  Take 1 tablet by mouth two  times daily     gabapentin 100 MG capsule  Commonly known as:  NEURONTIN  Take 2 capsules by mouth  every morning , 2 capsules  by mouth at 2pm and 1  capsule at bedtime     ipratropium-albuterol 0.5-2.5 (3) MG/3ML Soln  Commonly known as:  DUONEB  Take 3 mLs by nebulization every 6 (six) hours as needed.     Magnesium 250 MG Tabs  Take 1 tablet by mouth daily.     multivitamin with minerals Tabs tablet  Take 1 tablet by mouth every morning.     omeprazole 40 MG capsule  Commonly known as:  PRILOSEC  Take 1 capsule (40 mg total) by mouth at bedtime.     oxybutynin 10 MG 24 hr tablet  Commonly known as:  DITROPAN-XL  Take 1 tablet by mouth  daily     pramipexole 0.5 MG tablet  Commonly known as:  MIRAPEX  Take 1 tablet by mouth at  bedtime     STOOL SOFTENER PO  Take 2 tablets by mouth 2 (two) times daily.     vitamin C 500 MG tablet  Commonly known as:  ASCORBIC ACID  Take 500 mg by mouth daily.     Vitamin D3 5000 UNITS Caps  Take 1 capsule by mouth daily.        Relevant Imaging Results:  Relevant Lab Results:  Recent Labs    Additional Information SSN: [id number]  Kerry Myers, Kerry A, LCSW

## 2015-11-15 LAB — BASIC METABOLIC PANEL
ANION GAP: 5 (ref 5–15)
BUN: 17 mg/dL (ref 6–20)
CHLORIDE: 110 mmol/L (ref 101–111)
CO2: 25 mmol/L (ref 22–32)
Calcium: 8.4 mg/dL — ABNORMAL LOW (ref 8.9–10.3)
Creatinine, Ser: 0.4 mg/dL — ABNORMAL LOW (ref 0.44–1.00)
GFR calc non Af Amer: >60 mL/min (ref >60)
Glucose, Bld: 101 mg/dL — ABNORMAL HIGH (ref 65–99)
Potassium: 3.3 mmol/L — ABNORMAL LOW (ref 3.5–5.1)
Sodium: 140 mmol/L (ref 135–145)

## 2015-11-15 MED ORDER — AMOXICILLIN 250 MG PO CAPS: 250.0000 mg | ORAL_CAPSULE | Freq: Three times a day (TID) | ORAL | Status: AC

## 2015-11-15 MED ORDER — POTASSIUM CHLORIDE ER 10 MEQ PO TBCR
60.0000 meq | EXTENDED_RELEASE_TABLET | Freq: Once | ORAL | Status: AC
  Administered 2015-11-15: 60 meq via ORAL
  Filled 2015-11-15: qty 6

## 2015-11-15 MED ORDER — FENTANYL 12 MCG/HR TD PT72: 12.5000 ug | MEDICATED_PATCH | TRANSDERMAL | Status: AC

## 2015-11-15 MED ORDER — LORAZEPAM 0.5 MG PO TABS
0.5000 mg | ORAL_TABLET | Freq: Once | ORAL | Status: AC
  Administered 2015-11-15: 0.5 mg via ORAL
  Filled 2015-11-15: qty 1

## 2015-11-15 NOTE — Care Management Important Message (Signed)
Important Message  Patient Details  Name: EIBHLIN GORNIK MRN: [id number] Date of Birth: Dec 05, 1919   Medicare Important Message Given:  Yes    Camillo Flaming 11/15/2015, 10:39 AMImportant Message  Patient Details  Name: RONNY REICHELT MRN: [id number] Date of Birth: 1919-10-06   Medicare Important Message Given:  Yes    Camillo Flaming 11/15/2015, 10:39 AM

## 2015-11-15 NOTE — Discharge Summary (Addendum)
Physician Discharge Summary  Kerry Myers [id number] DOB: 03-26-1919 DOA: 11/11/2015  PCP: Garnet Koyanagi, DO  Admit date: 11/11/2015 Discharge date: 11/15/2015  Time spent: 40 minutes  Recommendations for Outpatient Follow-up:  1. Follow up with nursing home M.D. 2. Continue amoxicillin for 5 more days. 3. Patient discharged to [facility], she will be transferred to [facility] on 11/16/2015, see addendum below.   Discharge Diagnoses:  Principal Problem:   Weakness Active Problems:   Hereditary and idiopathic peripheral neuropathy   SCOLIOSIS, THORACOLUMBAR   Dysphagia, oropharyngeal phase   RLS (restless legs syndrome)   Protein-calorie malnutrition, severe (HCC)   Decubitus ulcer of upper back, stage 2   Diarrhea   Orthostatic hypotension   UTI (lower urinary tract infection)   Discharge Condition: Stable  Diet recommendation: Heart healthy  There were no vitals filed for this visit.  History of present illness:  Kerry Myers is a 81 y.o. female with a past medical history significant for ptosis, dementia severe, severe neuropathy, and restless leg syndrome who presents with dizziness.  All history is collected from the patient's son and daughter-in-law as the patient dementia is too severe. They report that over the last 6 months the patient has had progressive memory loss, weakness, and weight loss. She lives at home with them and has caretakers who come in the hospital to help her with certain activities. She is independent with angina with a walker and using the bathroom but is otherwise dependent.  In the last week she has had "swimmy headed" episodes with standing that are progressively worse. Tonight she was evaluated by her home health care providers who recommended that she present to the ER. EMS found the patient orthostatic.  In the ED, her electrolytes and CBC were unremarkable. She was given fluids in the emergency room with the expectation that she would  be able to go home, but after fluids the patient felt worse, and reported further dizziness accompanied by abdominal pain, cramps, and nausea with witnessed emesis and diarrhea. TRH were asked to admit for continued symptoms despite conservative management.  Hospital Course:   Enterococcus UTI Has urinalysis consistent with UTI, started on Rocephin, will continue. Culture showed enterococcus, antibiotics switched to amoxicillin. Patient has debris and blood clots inside the bladder, Foley catheter placed because of retention. Foley catheter discontinued, patient did well. Continue antibiotics for 5 more days.  Failure to thrive:  This is new.The family described a slow and gradual chronic decline over the course of 6-12 months.  The patient is emaciated and weak on exam and has signs of poor by mouth intake.  PT recommended nursing home placement.  Scoliosis:  Continue fentanyl patch from home Morphine as needed for extra pain Zofran as needed for nausea  Pressure ulcer:  Wound consult  Restless legs:  Stable.  Restart home medications today.   Procedures:  None  Consultations:  None  Discharge Exam: Filed Vitals:   11/15/15 0643  BP: 147/80  Pulse: 74  Temp: 98.4 F (36.9 C)  Resp: 16   General: Alert and awake, oriented x3, not in any acute distress. HEENT: anicteric sclera, pupils reactive to light and accommodation, EOMI CVS: S1-S2 clear, no murmur rubs or gallops Chest: clear to auscultation bilaterally, no wheezing, rales or rhonchi Abdomen: soft nontender, nondistended, normal bowel sounds, no organomegaly Extremities: no cyanosis, clubbing or edema noted bilaterally Neuro: Cranial nerves II-XII intact, no focal neurological deficits  Discharge Instructions   Discharge Instructions  Diet - low sodium heart healthy    Complete by:  As directed      Increase activity slowly    Complete by:  As directed           Current Discharge  Medication List    START taking these medications   Details  amoxicillin (AMOXIL) 250 MG capsule Take 1 capsule (250 mg total) by mouth every 8 (eight) hours.      CONTINUE these medications which have CHANGED   Details  fentaNYL (DURAGESIC - DOSED MCG/HR) 12 MCG/HR Place 1 patch (12.5 mcg total) onto the skin every 3 (three) days. Qty: 1 patch, Refills: 0   Associated Diagnoses: Chronic pain syndrome      CONTINUE these medications which have NOT CHANGED   Details  Cholecalciferol (VITAMIN D3) 5000 UNITS CAPS Take 1 capsule by mouth daily.    Docusate Calcium (STOOL SOFTENER PO) Take 2 tablets by mouth 2 (two) times daily.    gabapentin (NEURONTIN) 100 MG capsule Take 2 capsules by mouth  every morning , 2 capsules  by mouth at 2pm and 1  capsule at bedtime Qty: 450 capsule, Refills: 3    L-Methylfolate-B6-B12 (FOLTANX) 3-35-2 MG TABS Take 1 tablet by mouth two  times daily Qty: 180 tablet, Refills: 1    Magnesium 250 MG TABS Take 1 tablet by mouth daily.    Multiple Vitamin (MULTIVITAMIN WITH MINERALS) TABS Take 1 tablet by mouth every morning.     Omega-3 Fatty Acids (FISH OIL) 1000 MG CPDR Take 2,000 mg by mouth daily with lunch.     omeprazole (PRILOSEC) 40 MG capsule Take 1 capsule (40 mg total) by mouth at bedtime. Qty: 90 capsule, Refills: 3   Associated Diagnoses: Gastroesophageal reflux disease without esophagitis    oxybutynin (DITROPAN-XL) 10 MG 24 hr tablet Take 1 tablet by mouth  daily Qty: 90 tablet, Refills: 1   Associated Diagnoses: Incontinence in female    pramipexole (MIRAPEX) 0.5 MG tablet Take 1 tablet by mouth at  bedtime Qty: 90 tablet, Refills: 3   Associated Diagnoses: RLS (restless legs syndrome)    vitamin C (ASCORBIC ACID) 500 MG tablet Take 500 mg by mouth daily.    acetaminophen (TYLENOL) 325 MG tablet Take 325-650 mg by mouth every 6 (six) hours as needed.    albuterol (PROVENTIL HFA;VENTOLIN HFA) 108 (90 BASE) MCG/ACT inhaler Inhale 2  puffs into the lungs every 6 (six) hours as needed for wheezing. Qty: 1 Inhaler, Refills: 0    ipratropium-albuterol (DUONEB) 0.5-2.5 (3) MG/3ML SOLN Take 3 mLs by nebulization every 6 (six) hours as needed. Qty: 360 mL, Refills: 0    Respiratory Therapy Supplies (ADULT MASK) MISC Oxygen Mask for home Oxygen Qty: 1 each, Refills: 2       No Known Allergies Follow-up Information    Follow up with Garnet Koyanagi, DO.   Specialty:  Family Medicine   Why:  As needed next week   Contact information:   [place] [address] [place] [address] [phone]       Follow up with [facility]   Specialty:  Emergency Medicine   Why:  As needed, If symptoms worsen   Contact information:   [address] [id number] [place] [phone]       The results of significant diagnostics from this hospitalization (including imaging, microbiology, ancillary and laboratory) are listed below for reference.    Significant Diagnostic Studies:  Dg Chest 2 View  11/11/2015  CLINICAL DATA:  Weakness for 1 week.  Cough, productive. EXAM: CHEST  2 VIEW COMPARISON:  06/20/2015 FINDINGS: Small left pleural effusion which is chronic based on previous imaging. There is associated mild atelectasis. No edema, consolidation, or air leak. Chronic borderline cardiomegaly. Moderate hiatal hernia with gas fluid level. No acute osseous finding. IMPRESSION: 1. Baseline appearance of the chest. No evidence of acute cardiopulmonary disease. 2. Chronic small left pleural effusion and basilar scarring. 3. Moderate hiatal hernia. Electronically Signed   By: Monte Fantasia M.D.   On: 11/11/2015 22:29   Ct Head Wo Contrast  11/12/2015  CLINICAL DATA:  Acute onset of generalized weakness for 1 week. Mild shortness of breath. Initial encounter. EXAM: CT HEAD WITHOUT CONTRAST TECHNIQUE: Contiguous axial images were obtained from the base of the skull  through the vertex without intravenous contrast. COMPARISON:  CT of the head performed 04/10/2015 FINDINGS: There is no evidence of acute infarction, mass lesion, or intra- or extra-axial hemorrhage on CT. Prominence of the ventricles and sulci reflects moderate cortical volume loss. Mild cerebellar atrophy is noted. Scattered periventricular and subcortical white matter change likely reflects small vessel ischemic microangiopathy. Mild chronic ischemic change is noted at the posterior limb of the left internal capsule. The brainstem and fourth ventricle are within normal limits. The cerebral hemispheres demonstrate grossly normal gray-white differentiation. No mass effect or midline shift is seen. There is no evidence of fracture; visualized osseous structures are unremarkable in appearance. The orbits are within normal limits. The paranasal sinuses and mastoid air cells are well-aerated. No significant soft tissue abnormalities are seen. IMPRESSION: 1. No acute intracranial pathology seen on CT. 2. Moderate cortical volume loss and scattered small vessel ischemic microangiopathy. 3. Mild chronic ischemic change at the posterior limb of the left internal capsule. Electronically Signed   By: Garald Balding M.D.   On: 11/12/2015 04:00   Ct Abdomen Pelvis W Contrast  11/12/2015  CLINICAL DATA:  Decreased appetite and progressive weakness for 1 week. EXAM: CT ABDOMEN AND PELVIS WITH CONTRAST TECHNIQUE: Multidetector CT imaging of the abdomen and pelvis was performed using the standard protocol following bolus administration of intravenous contrast. CONTRAST:  45mL OMNIPAQUE IOHEXOL 300 MG/ML  SOLN COMPARISON:  07/21/2012 FINDINGS: Lower chest and abdominal wall: Chronic cardiomegaly with extensive coronary atherosclerosis. Moderate sliding-type hiatal hernia. Small bilateral layering pleural effusion with left more than right lower lobe atelectasis. Hepatobiliary: Low-density appearance of the liver is likely from  contrast timing. No focal lesion is seen.No evidence of biliary obstruction or stone. Pancreas: Chronically dilated distal main duct of 5 mm. No visible obstructive process or inflammation. Spleen: Unremarkable. Adrenals/Urinary Tract: Negative adrenals. Mild bilateral hydronephrosis from dilated urinary bladder. Bilateral renal cortical thinning with superimposed patchy right renal cortical scarring. Markedly dilated bladder with dependent debris or hemorrhage. Reproductive:Hysterectomy and negative adnexae. Stomach/Bowel: Extensive formed stool. Distal colonic diverticulosis without evidence of active inflammation. No indication of bowel obstruction. No pericecal inflammation. Vascular/Lymphatic: Extensive aortic and branch vessel atherosclerosis. No acute vascular finding. No mass or adenopathy. Peritoneal: Trace ascites, potentially reactive to the bladder findings. Musculoskeletal: Severe and diffuse facet and disc degeneration with dextroscoliosis. Osteopenia which is profound. No acute osseous finding IMPRESSION: 1. Overdistended bladder (causing hydronephrosis) with layering debris or hemorrhage. Correlate with urinalysis. 2. Small bilateral pleural effusion with atelectasis. 3. Numerous chronic findings are stable from 2013 and described above. Electronically Signed   By: Monte Fantasia M.D.   On: 11/12/2015  05:39    Microbiology: Recent Results (from the past 240 hour(s))  Urine culture     Status: None   Collection Time: 11/11/15  9:26 PM  Result Value Ref Range Status   Specimen Description URINE, CLEAN CATCH  Final   Special Requests NONE  Final   Culture   Final    >=100,000 COLONIES/mL ENTEROCOCCUS SPECIES Performed at [facility]    Report Status 11/14/2015 FINAL  Final   Organism ID, Bacteria ENTEROCOCCUS SPECIES  Final      Susceptibility   Enterococcus species - MIC*    AMPICILLIN <=2 SENSITIVE Sensitive     LEVOFLOXACIN 0.5 SENSITIVE Sensitive     NITROFURANTOIN <=16  SENSITIVE Sensitive     VANCOMYCIN 1 SENSITIVE Sensitive     * >=100,000 COLONIES/mL ENTEROCOCCUS SPECIES     Labs: Basic Metabolic Panel:  Recent Labs Lab 11/11/15 2131 11/13/15 0435 11/15/15 0430  NA 140 142 140  K 3.9 3.7 3.3*  CL 100* 110 110  CO2 33* 28 25  GLUCOSE 92 101* 101*  BUN 25* 20 17  CREATININE 0.45 0.57 0.40*  CALCIUM 9.9 8.6* 8.4*   Liver Function Tests:  Recent Labs Lab 11/11/15 2131 11/13/15 0435  AST 22 51*  ALT 13* 19  ALKPHOS 60 42  BILITOT 0.5 0.4  PROT 7.1 5.7*  ALBUMIN 3.7 2.9*   No results for input(s): LIPASE, AMYLASE in the last 168 hours. No results for input(s): AMMONIA in the last 168 hours. CBC:  Recent Labs Lab 11/11/15 2131 11/13/15 0435  WBC 8.0 9.2  NEUTROABS 5.3  --   HGB 12.0 9.7*  HCT 37.3 30.2*  MCV 95.6 96.5  PLT 218 168   Cardiac Enzymes: No results for input(s): CKTOTAL, CKMB, CKMBINDEX, TROPONINI in the last 168 hours. BNP: BNP (last 3 results)  Recent Labs  04/15/15 0020  BNP 195.4*    ProBNP (last 3 results) No results for input(s): PROBNP in the last 8760 hours.  CBG: No results for input(s): GLUCAP in the last 168 hours.     Signed:  Kathy Wahid A  [facility] 11/15/2015, 9:42 AM    This is an addendum to the discharge summary: Patient and family wants to [facility] home, bed available in the morning. There is no medical necessity for the patient to stay in the hospital. Patient could've been discharged to any nursing home today , But family insisting about going to [facility] home. Patient will discharge from hospital, stay 1 day in the [facility] assisted facility. It is okay from my standpoint to stay one night in the assisted living facility and to go to the skilled nursing facility in the morning.  Birdie Hopes Pager: [phone] 11/15/2015, 3:56 PM

## 2015-11-15 NOTE — NC FL2 (Signed)
[place] LEVEL OF CARE SCREENING TOOL     IDENTIFICATION  Patient Name: Kerry Myers Birthdate: 10-Mar-1919 Sex: female Admission Date (Current Location): 11/11/2015  [place] and [place] Number: [facility] and Address:  [facility],  [place] [address], [place]      Provider Number: [id number]  Attending Physician Name and Address:  Verlee Monte, MD  Relative Name and Phone Number:       Current Level of Care: Hospital Recommended Level of Care: [place] Prior Approval Number:    Date Approved/Denied:   PASRR Number: [id number]  Discharge Plan: SNF    Current Diagnoses: Patient Active Problem List   Diagnosis Date Noted  . Weakness 11/12/2015  . Diarrhea 11/12/2015  . Orthostatic hypotension 11/12/2015  . UTI (lower urinary tract infection) 11/12/2015  . Bilateral arm pain 10/31/2015  . Decubitus ulcer of upper back, stage 2 07/18/2015  . Pressure ulcer of back 06/01/2015  . Emphysema/COPD ([place]) 04/25/2015  . Hiatal hernia 04/25/2015  . Toe ulcer ([place]) 04/10/2015  . Pupil dilation 04/10/2015  . Respiratory failure with hypoxia ([place]) 04/10/2015  . Constipation due to opioid therapy 01/31/2015  . Fall at home 01/23/2015  . At high risk for falls 01/23/2015  . Chest pain 11/04/2014  . Dyspnea 03/06/2014  . Protein-calorie malnutrition, severe ([place]) 12/04/2013  . RLS (restless legs syndrome)   . Bladder incontinence   . Hiatal hernia with gastroesophageal reflux   . Hypoxia 12/03/2013  . PNA (pneumonia) 12/03/2013  . CAP (community acquired pneumonia) 12/03/2013  . Acute on chronic respiratory failure ([place]) 03/26/2013  . Dysphagia, unspecified(787.20) 03/26/2013  . SOB (shortness of breath) 03/23/2013  . Aspiration pneumonia ([place]) 03/23/2013  . Generalized weakness 03/23/2013  . Hypoxemia 03/23/2013  . Anemia 10/05/2011  . Coronary atherosclerosis 02/21/2011  . GERD 02/21/2011  . FACIAL  PAIN 02/21/2011  . DYSPHAGIA 02/21/2011  . CALLUSES, FEET, BILATERAL 02/19/2011  . Dysphagia, oropharyngeal phase 02/19/2011  . STRESS INCONTINENCE 05/02/2010  . WOUND, LEG 02/10/2009  . CELLULITIS, LEG, LEFT 01/21/2009  . CALF PAIN, LEFT 01/21/2009  . EDEMA 10/12/2008  . PULMONARY HYPERTENSION, MILD 12/29/2007  . SCOLIOSIS, THORACOLUMBAR 12/29/2007  . RESTLESS LEG SYNDROME 06/17/2007  . Hereditary and idiopathic peripheral neuropathy 06/17/2007  . Osteoarthritis 06/17/2007  . Osteoporosis 06/17/2007    Orientation ACTIVITIES/SOCIAL BLADDER RESPIRATION    Self, Place  Active Incontinent, Indwelling catheter O2 (As needed) (2L O2)  BEHAVIORAL SYMPTOMS/MOOD NEUROLOGICAL BOWEL NUTRITION STATUS     (NONE) Continent Diet (Dys 3)  PHYSICIAN VISITS COMMUNICATION OF NEEDS Height & Weight Skin    Verbally 4\' 8"  (142.2 cm)   PU Stage and Appropriate Care   PU Stage 2 Dressing:  (Every 5 days)      AMBULATORY STATUS RESPIRATION    Supervision limited (+1 assist) O2 (As needed) (2L O2)      Personal Care Assistance Level of Assistance  Bathing, Dressing, Feeding Bathing Assistance: Limited assistance Feeding assistance: Limited assistance (needs set up for meals) Dressing Assistance: Limited assistance      Functional Limitations Info  Sight, Hearing, Speech Sight Info: Adequate Hearing Info: Adequate Speech Info: Adequate       SPECIAL CARE FACTORS FREQUENCY  PT (By licensed PT), OT (By licensed OT)     PT Frequency: 5 x week OT Frequency: 5 x week           Additional Factors Info  Code Status, Allergies Code Status  Info: DNR code status Allergies Info: No Known Allergies           Current Medications (11/15/2015): Current Facility-Administered Medications  Medication Dose Route Frequency Provider Last Rate Last Dose  . 0.9 %  sodium chloride infusion   Intravenous Continuous Verlee Monte, MD 10 mL/hr at 11/13/15 0842    . acetaminophen (TYLENOL) tablet  650 mg  650 mg Oral Q6H PRN Edwin Dada, MD       Or  . acetaminophen (TYLENOL) suppository 650 mg  650 mg Rectal Q6H PRN Edwin Dada, MD      . albuterol (PROVENTIL) (2.5 MG/3ML) 0.083% nebulizer solution 3 mL  3 mL Inhalation Q6H PRN Edwin Dada, MD      . amoxicillin (AMOXIL) capsule 250 mg  250 mg Oral 3 times per day Verlee Monte, MD   250 mg at 11/15/15 1420  . enoxaparin (LOVENOX) injection 30 mg  30 mg Subcutaneous Q24H Edwin Dada, MD   30 mg at 11/14/15 2240  . feeding supplement (ENSURE ENLIVE) (ENSURE ENLIVE) liquid 237 mL  237 mL Oral BID BM Satira Anis Ward, RD   237 mL at 11/15/15 1421  . fentaNYL (DURAGESIC - dosed mcg/hr) 12.5 mcg  12.5 mcg Transdermal Q72H Edwin Dada, MD   12.5 mcg at 11/15/15 0847  . gabapentin (NEURONTIN) capsule 100 mg  100 mg Oral QHS Verlee Monte, MD   100 mg at 11/14/15 2240  . gabapentin (NEURONTIN) capsule 200 mg  200 mg Oral BID WC Verlee Monte, MD   200 mg at 11/15/15 1420  . haloperidol lactate (HALDOL) injection 1 mg  1 mg Intravenous Q6H PRN Hewitt Shorts Harduk, PA-C   1 mg at 11/12/15 2100  . morphine 2 MG/ML injection 2 mg  2 mg Intravenous Q4H PRN Edwin Dada, MD   2 mg at 11/13/15 0045  . ondansetron (ZOFRAN) tablet 4 mg  4 mg Oral Q6H PRN Edwin Dada, MD       Or  . ondansetron (ZOFRAN) injection 4 mg  4 mg Intravenous Q6H PRN Edwin Dada, MD      . polyethylene glycol (MIRALAX / GLYCOLAX) packet 17 g  17 g Oral Daily Verlee Monte, MD   17 g at 11/14/15 1200  . pramipexole (MIRAPEX) tablet 0.5 mg  0.5 mg Oral QHS Hewitt Shorts Harduk, PA-C   0.5 mg at 11/14/15 2242  . senna-docusate (Senokot-S) tablet 1 tablet  1 tablet Oral QHS PRN Edwin Dada, MD   1 tablet at 11/14/15 2240   Do not use this list as official medication orders. Please verify with discharge summary.  Discharge Medications:   Medication List    TAKE these medications        acetaminophen 325 MG  tablet  Commonly known as:  TYLENOL  Take 325-650 mg by mouth every 6 (six) hours as needed.     Adult Mask Misc  Oxygen Mask for home Oxygen     albuterol 108 (90 BASE) MCG/ACT inhaler  Commonly known as:  PROVENTIL HFA;VENTOLIN HFA  Inhale 2 puffs into the lungs every 6 (six) hours as needed for wheezing.     amoxicillin 250 MG capsule  Commonly known as:  AMOXIL  Take 1 capsule (250 mg total) by mouth every 8 (eight) hours.     fentaNYL 12 MCG/HR  Commonly known as:  DURAGESIC - dosed mcg/hr  Place 1 patch (12.5 mcg total) onto the skin every  3 (three) days.     Fish Oil 1000 MG Cpdr  Take 2,000 mg by mouth daily with lunch.     FOLTANX 3-35-2 MG Tabs  Take 1 tablet by mouth two  times daily     gabapentin 100 MG capsule  Commonly known as:  NEURONTIN  Take 2 capsules by mouth  every morning , 2 capsules  by mouth at 2pm and 1  capsule at bedtime     ipratropium-albuterol 0.5-2.5 (3) MG/3ML Soln  Commonly known as:  DUONEB  Take 3 mLs by nebulization every 6 (six) hours as needed.     Magnesium 250 MG Tabs  Take 1 tablet by mouth daily.     multivitamin with minerals Tabs tablet  Take 1 tablet by mouth every morning.     omeprazole 40 MG capsule  Commonly known as:  PRILOSEC  Take 1 capsule (40 mg total) by mouth at bedtime.     oxybutynin 10 MG 24 hr tablet  Commonly known as:  DITROPAN-XL  Take 1 tablet by mouth  daily     pramipexole 0.5 MG tablet  Commonly known as:  MIRAPEX  Take 1 tablet by mouth at  bedtime     STOOL SOFTENER PO  Take 2 tablets by mouth 2 (two) times daily.     vitamin C 500 MG tablet  Commonly known as:  ASCORBIC ACID  Take 500 mg by mouth daily.     Vitamin D3 5000 UNITS Caps  Take 1 capsule by mouth daily.        Relevant Imaging Results:  Relevant Lab Results:  Recent Labs    Additional Information SSN: [id number]  KIDD, SUZANNA A, LCSW

## 2015-11-15 NOTE — Discharge Instructions (Signed)
You have been seen today for weakness, cough, and dizziness. Your imaging and lab tests showed no significant abnormalities. He must continue to drink fluids and eat regular meals. Protein and nutritional drinks are acceptable if you are unable to eat solid food, however a combination of these types of drinks and solid food is best. Follow up with PCP next week. Return to ED should symptoms worsen. Allergies An allergy is an abnormal reaction to a substance by the body's defense system (immune system). Allergies can develop at any age. WHAT CAUSES ALLERGIES? An allergic reaction happens when the immune system mistakenly reacts to a normally harmless substance, called an allergen, as if it were harmful. The immune system releases antibodies to fight the substance. Antibodies eventually release a chemical called histamine into the bloodstream. The release of histamine is meant to protect the body from infection, but it also causes discomfort. An allergic reaction can be triggered by:  Eating an allergen.  Inhaling an allergen.  Touching an allergen. WHAT TYPES OF ALLERGIES ARE THERE? There are many types of allergies. Common types include:  Seasonal allergies. People with this type of allergy are usually allergic to substances that are only present during certain seasons, such as molds and pollens.  Food allergies.  Drug allergies.  Insect allergies.  Animal dander allergies. WHAT ARE SYMPTOMS OF ALLERGIES? Possible allergy symptoms include:  Swelling of the lips, face, tongue, mouth, or throat.  Sneezing, coughing, or wheezing.  Nasal congestion.  Tingling in the mouth.  Rash.  Itching.  Itchy, red, swollen areas of skin (hives).  Watery eyes.  Vomiting.  Diarrhea.  Dizziness.  Lightheadedness.  Fainting.  Trouble breathing or swallowing.  Chest tightness.  Rapid heartbeat. HOW ARE ALLERGIES DIAGNOSED? Allergies are diagnosed with a medical and family history  and one or more of the following:  Skin tests.  Blood tests.  A food diary. A food diary is a record of all the foods and drinks you have in a day and of all the symptoms you experience.  The results of an elimination diet. An elimination diet involves eliminating foods from your diet and then adding them back in one by one to find out if a certain food causes an allergic reaction. HOW ARE ALLERGIES TREATED? There is no cure for allergies, but allergic reactions can be treated with medicine. Severe reactions usually need to be treated at a hospital. HOW CAN REACTIONS BE PREVENTED? The best way to prevent an allergic reaction is by avoiding the substance you are allergic to. Allergy shots and medicines can also help prevent reactions in some cases. People with severe allergic reactions may be able to prevent a life-threatening reaction called anaphylaxis with a medicine given right after exposure to the allergen.   This information is not intended to replace advice given to you by your health care provider. Make sure you discuss any questions you have with your health care provider.   Document Released: 03/12/2003 Document Revised: 01/07/2015 Document Reviewed: 09/28/2014 Elsevier Interactive Patient Education [employer].

## 2015-11-17 NOTE — Clinical Social Work Placement (Signed)
   CLINICAL SOCIAL WORK PLACEMENT  NOTE  Date:  11/17/2015  Patient Details  Name: Kerry Myers MRN: [id number] Date of Birth: 07/20/1919  Clinical Social Work is seeking post-discharge placement for this patient at the [place] level of care (*CSW will initial, date and re-position this form in  chart as items are completed):  Yes   Patient/family provided with [facility] Work Department's list of facilities offering this level of care within the geographic area requested by the patient (or if unable, by the patient's family).  Yes   Patient/family informed of their freedom to choose among providers that offer the needed level of care, that participate in Medicare, Medicaid or managed care program needed by the patient, have an available bed and are willing to accept the patient.  Yes   Patient/family informed of [facility]'s ownership interest in [facility] and [facility], as well as of the fact that they are under no obligation to receive care at these facilities.  PASRR submitted to EDS on       PASRR number received on       Existing PASRR number confirmed on 11/14/15     FL2 transmitted to all facilities in geographic area requested by pt/family on 11/14/15     FL2 transmitted to all facilities within larger geographic area on       Patient informed that his/her managed care company has contracts with or will negotiate with certain facilities, including the following:        Yes   Patient/family informed of bed offers received.  Patient chooses bed at [facility]     Physician recommends and patient chooses bed at      Patient to be transferred to [facility] on 11/17/15.  Patient to be transferred to facility by ambulance [facility])     Patient family notified on 11/17/15 of transfer.  Name of family member notified:  pt and pt daughter notified at bedside; pt son, [name] notified via telephone      PHYSICIAN Please sign FL2, Please sign DNR     Additional Comment:    _______________________________________________ Ladell Pier, LCSW 11/17/2015, 4:43 PM

## 2015-11-17 NOTE — Progress Notes (Signed)
Clinical Social Work Progress Note-Late Entry  CSW met with pt daughter at bedside this morning regarding SNF bed offers as pt medically ready for discharge. Pt daughter asked CSW to discuss with pt son, [name].   CSW contacted pt son, [name] via telephone and emailed pt brother SNF bed offers. Pt son wants [facility], but SNF does not have bed available today. CSW explained to pt son that pt is medically ready today and does not meet medical criteria to remain in the hospital to await a bed at [place] [facility] notified pt son that options would be for pt to return home with home health with 24 hour care until bed becomes available at SNF or choose bed at a facility that offered placement.   Pt son states that he knows someone at [facility] and wants to speak with family and communicate with facility to see if facility has a way to assist family.  CSW received notification from [facility] that facility is willing to accept pt to [facility] for one night then transition into [facility] the next day. Pt family is in agreement to this plan. MD agreeable.   CSW facilitated pt discharge needs to [facility] including contacting facility, faxing pt discharge information, providing RN phone number to call report, discussing with pt and pt family, and arranging ambulance transportation for pt to [facility].   No further social work needs identified at this time  [place] signing off.   Alison Murray, MSW, [facility] [phone]

## 2015-11-17 NOTE — Clinical Social Work Note (Signed)
Clinical Social Work Assessment-Late Entry  Patient Details  Name: Kerry Myers MRN: [id number] Date of Birth: 02-20-1919  Date of referral:  11/15/15               Reason for consult:  Discharge Planning                Permission sought to share information with:  Family Supports Permission granted to share information::  Yes, Verbal Permission Granted  Name::     [name] Wilmon Pali  Agency::     Relationship::  son  Contact Information:     Housing/Transportation Living arrangements for the past 2 months:  [place] of Information:  Adult Children Patient Interpreter Needed:  None Criminal Activity/Legal Involvement Pertinent to Current Situation/Hospitalization:  No - Comment as needed Significant Relationships:  Adult Children Lives with:  Adult Children Do you feel safe going back to the place where you live?  No Need for family participation in patient care:  Yes (Comment)  Care giving concerns:  Pt admitted from home. Pt sons live with pt, but pt has private caregiver 6 hours a day. PT recommending SNF.   Social Worker assessment / plan:  CSW received referral for new SNF.   CSW met with pt and pt daughter at bedside. CSW introduced self and explained role. CSW discussed recommendation for SNF upon discharge. Pt daughter states that pt has been to [facility] in the past. Pt daughter agreeable to [facility]. SNF search.  CSW completed FL2 and initiated SNF search to [facility].  CSW to follow up with pt and pt family with SNF bed offers.   Employment status:  Retired Forensic scientist:  Medicare PT Recommendations:  [place] / Referral to community resources:  [place]  Patient/Family's Response to care:  Pt alert and oriented x 2. Pt family supportive at home, but feel pt needing rehab at [facility].   Patient/Family's Understanding of and Emotional Response to Diagnosis, Current Treatment, and  Prognosis:  Pt daughter displayed knowledge surrounding pt diagnosis and treatment plan.   Emotional Assessment Appearance:  Appears stated age Attitude/Demeanor/Rapport:  Other (pt appropriate) Affect (typically observed):  Appropriate Orientation:  Oriented to Self, Oriented to Place Alcohol / Substance use:  Not Applicable Psych involvement (Current and /or in the community):  No (Comment)  Discharge Needs  Concerns to be addressed:  Discharge Planning Concerns Readmission within the last 30 days:  No Current discharge risk:  Physical Impairment Barriers to Discharge:  Continued Medical Work up   Alison Murray A, LCSW 11/17/2015, 4:32 PM

## 2016-01-13 ENCOUNTER — Other Ambulatory Visit: Payer: Self-pay | Admitting: Family Medicine

## 2016-01-16 ENCOUNTER — Encounter: Payer: Medicare Other | Admitting: Family Medicine

## 2016-01-23 ENCOUNTER — Telehealth: Payer: Self-pay | Admitting: *Deleted

## 2016-01-23 NOTE — Telephone Encounter (Signed)
Received fax for DNR signature with only the last name dated [No first name and/or DOB]; refax to facility with note for information needed. Received updated form with complete patient information, completed by provider and faxed to Nurse station at [phone], sent to scan/SLS 01/23

## 2016-03-31 DEATH — deceased

## 2016-04-23 ENCOUNTER — Ambulatory Visit: Payer: Medicare Other | Admitting: Family Medicine
